# Patient Record
Sex: Male | Born: 1935 | Race: White | Hispanic: No | Marital: Married | Smoking: Never smoker
Health system: Southern US, Community
[De-identification: ages and names within clinical notes are randomized; demographics above are authoritative.]

---

## 2020-09-29 ENCOUNTER — Emergency Department: Payer: Medicare HMO

## 2020-09-29 ENCOUNTER — Observation Stay: Payer: Medicare HMO

## 2020-09-29 ENCOUNTER — Encounter: Payer: Self-pay | Admitting: Internal Medicine

## 2020-09-29 ENCOUNTER — Inpatient Hospital Stay
Admission: EM | Admit: 2020-09-29 | Discharge: 2020-10-07 | DRG: 175 | Disposition: A | Discharge status: Hospice | Payer: Medicare HMO | Attending: Family Medicine | Admitting: Family Medicine

## 2020-09-29 ENCOUNTER — Other Ambulatory Visit: Payer: Self-pay

## 2020-09-29 DIAGNOSIS — M109 Gout, unspecified: Secondary | ICD-10-CM | POA: Diagnosis present

## 2020-09-29 DIAGNOSIS — I495 Sick sinus syndrome: Secondary | ICD-10-CM | POA: Diagnosis present

## 2020-09-29 DIAGNOSIS — E039 Hypothyroidism, unspecified: Secondary | ICD-10-CM | POA: Diagnosis present

## 2020-09-29 DIAGNOSIS — N179 Acute kidney failure, unspecified: Secondary | ICD-10-CM | POA: Diagnosis present

## 2020-09-29 DIAGNOSIS — R0603 Acute respiratory distress: Secondary | ICD-10-CM | POA: Diagnosis not present

## 2020-09-29 DIAGNOSIS — Z20822 Contact with and (suspected) exposure to covid-19: Secondary | ICD-10-CM | POA: Diagnosis present

## 2020-09-29 DIAGNOSIS — K449 Diaphragmatic hernia without obstruction or gangrene: Secondary | ICD-10-CM | POA: Diagnosis present

## 2020-09-29 DIAGNOSIS — Z9889 Other specified postprocedural states: Secondary | ICD-10-CM

## 2020-09-29 DIAGNOSIS — R0602 Shortness of breath: Secondary | ICD-10-CM

## 2020-09-29 DIAGNOSIS — Z79899 Other long term (current) drug therapy: Secondary | ICD-10-CM

## 2020-09-29 DIAGNOSIS — I482 Chronic atrial fibrillation, unspecified: Secondary | ICD-10-CM | POA: Diagnosis present

## 2020-09-29 DIAGNOSIS — J9811 Atelectasis: Secondary | ICD-10-CM | POA: Diagnosis not present

## 2020-09-29 DIAGNOSIS — I130 Hypertensive heart and chronic kidney disease with heart failure and stage 1 through stage 4 chronic kidney disease, or unspecified chronic kidney disease: Secondary | ICD-10-CM | POA: Diagnosis present

## 2020-09-29 DIAGNOSIS — G9341 Metabolic encephalopathy: Secondary | ICD-10-CM | POA: Diagnosis present

## 2020-09-29 DIAGNOSIS — E785 Hyperlipidemia, unspecified: Secondary | ICD-10-CM | POA: Diagnosis present

## 2020-09-29 DIAGNOSIS — E222 Syndrome of inappropriate secretion of antidiuretic hormone: Secondary | ICD-10-CM | POA: Diagnosis present

## 2020-09-29 DIAGNOSIS — Z7901 Long term (current) use of anticoagulants: Secondary | ICD-10-CM

## 2020-09-29 DIAGNOSIS — D631 Anemia in chronic kidney disease: Secondary | ICD-10-CM | POA: Diagnosis present

## 2020-09-29 DIAGNOSIS — I2609 Other pulmonary embolism with acute cor pulmonale: Secondary | ICD-10-CM | POA: Diagnosis not present

## 2020-09-29 DIAGNOSIS — I509 Heart failure, unspecified: Secondary | ICD-10-CM

## 2020-09-29 DIAGNOSIS — Z950 Presence of cardiac pacemaker: Secondary | ICD-10-CM

## 2020-09-29 DIAGNOSIS — J90 Pleural effusion, not elsewhere classified: Secondary | ICD-10-CM | POA: Diagnosis present

## 2020-09-29 DIAGNOSIS — R34 Anuria and oliguria: Secondary | ICD-10-CM | POA: Diagnosis not present

## 2020-09-29 DIAGNOSIS — I50810 Right heart failure, unspecified: Secondary | ICD-10-CM | POA: Diagnosis present

## 2020-09-29 DIAGNOSIS — E43 Unspecified severe protein-calorie malnutrition: Secondary | ICD-10-CM | POA: Diagnosis present

## 2020-09-29 DIAGNOSIS — D509 Iron deficiency anemia, unspecified: Secondary | ICD-10-CM | POA: Diagnosis present

## 2020-09-29 DIAGNOSIS — I2782 Chronic pulmonary embolism: Secondary | ICD-10-CM | POA: Diagnosis present

## 2020-09-29 DIAGNOSIS — I959 Hypotension, unspecified: Secondary | ICD-10-CM | POA: Diagnosis present

## 2020-09-29 DIAGNOSIS — K5730 Diverticulosis of large intestine without perforation or abscess without bleeding: Secondary | ICD-10-CM | POA: Diagnosis present

## 2020-09-29 DIAGNOSIS — E875 Hyperkalemia: Secondary | ICD-10-CM | POA: Diagnosis present

## 2020-09-29 DIAGNOSIS — H918X9 Other specified hearing loss, unspecified ear: Secondary | ICD-10-CM | POA: Diagnosis present

## 2020-09-29 DIAGNOSIS — K7460 Unspecified cirrhosis of liver: Secondary | ICD-10-CM

## 2020-09-29 DIAGNOSIS — Z515 Encounter for palliative care: Secondary | ICD-10-CM

## 2020-09-29 DIAGNOSIS — N1832 Chronic kidney disease, stage 3b: Secondary | ICD-10-CM | POA: Diagnosis present

## 2020-09-29 DIAGNOSIS — I2724 Chronic thromboembolic pulmonary hypertension: Secondary | ICD-10-CM | POA: Diagnosis present

## 2020-09-29 DIAGNOSIS — K257 Chronic gastric ulcer without hemorrhage or perforation: Secondary | ICD-10-CM | POA: Diagnosis present

## 2020-09-29 DIAGNOSIS — Z72820 Sleep deprivation: Secondary | ICD-10-CM

## 2020-09-29 DIAGNOSIS — I2510 Atherosclerotic heart disease of native coronary artery without angina pectoris: Secondary | ICD-10-CM | POA: Diagnosis present

## 2020-09-29 DIAGNOSIS — Z903 Acquired absence of stomach [part of]: Secondary | ICD-10-CM

## 2020-09-29 DIAGNOSIS — Z681 Body mass index (BMI) 19 or less, adult: Secondary | ICD-10-CM

## 2020-09-29 DIAGNOSIS — E871 Hypo-osmolality and hyponatremia: Secondary | ICD-10-CM

## 2020-09-29 DIAGNOSIS — R64 Cachexia: Secondary | ICD-10-CM | POA: Diagnosis present

## 2020-09-29 DIAGNOSIS — I4819 Other persistent atrial fibrillation: Secondary | ICD-10-CM | POA: Diagnosis present

## 2020-09-29 DIAGNOSIS — J9601 Acute respiratory failure with hypoxia: Secondary | ICD-10-CM | POA: Diagnosis present

## 2020-09-29 DIAGNOSIS — R54 Age-related physical debility: Secondary | ICD-10-CM | POA: Diagnosis present

## 2020-09-29 DIAGNOSIS — I272 Pulmonary hypertension, unspecified: Secondary | ICD-10-CM | POA: Diagnosis present

## 2020-09-29 LAB — COMPREHENSIVE METABOLIC PANEL
ALT: 14 U/L (ref 0–44)
AST: 33 U/L (ref 15–41)
Albumin: 3.5 g/dL (ref 3.5–5.0)
Alkaline Phosphatase: 137 U/L — ABNORMAL HIGH (ref 38–126)
Anion gap: 13 (ref 5–15)
BUN: 35 mg/dL — ABNORMAL HIGH (ref 8–23)
CO2: 24 mmol/L (ref 22–32)
Calcium: 9.3 mg/dL (ref 8.9–10.3)
Chloride: 85 mmol/L — ABNORMAL LOW (ref 98–111)
Creatinine, Ser: 1.24 mg/dL (ref 0.61–1.24)
GFR, Estimated: 57 mL/min — ABNORMAL LOW (ref >60)
Glucose, Bld: 75 mg/dL (ref 70–99)
Potassium: 4.2 mmol/L (ref 3.5–5.1)
Sodium: 122 mmol/L — ABNORMAL LOW (ref 135–145)
Total Bilirubin: 1.5 mg/dL — ABNORMAL HIGH (ref 0.3–1.2)
Total Protein: 7 g/dL (ref 6.5–8.1)

## 2020-09-29 LAB — CBC WITH DIFFERENTIAL/PLATELET
Abs Immature Granulocytes: 0.01 10*3/uL (ref 0.00–0.07)
Basophils Absolute: 0 10*3/uL (ref 0.0–0.1)
Basophils Relative: 1 %
Eosinophils Absolute: 0 10*3/uL (ref 0.0–0.5)
Eosinophils Relative: 1 %
HCT: 33.1 % — ABNORMAL LOW (ref 39.0–52.0)
Hemoglobin: 11.4 g/dL — ABNORMAL LOW (ref 13.0–17.0)
Immature Granulocytes: 0 %
Lymphocytes Relative: 14 %
Lymphs Abs: 0.6 10*3/uL — ABNORMAL LOW (ref 0.7–4.0)
MCH: 32.3 pg (ref 26.0–34.0)
MCHC: 34.4 g/dL (ref 30.0–36.0)
MCV: 93.8 fL (ref 80.0–100.0)
Monocytes Absolute: 0.5 10*3/uL (ref 0.1–1.0)
Monocytes Relative: 13 %
Neutro Abs: 2.8 10*3/uL (ref 1.7–7.7)
Neutrophils Relative %: 71 %
Platelets: 157 10*3/uL (ref 150–400)
RBC: 3.53 MIL/uL — ABNORMAL LOW (ref 4.22–5.81)
RDW: 14.7 % (ref 11.5–15.5)
WBC: 4 10*3/uL (ref 4.0–10.5)
nRBC: 0 % (ref 0.0–0.2)

## 2020-09-29 LAB — RESP PANEL BY RT-PCR (FLU A&B, COVID) ARPGX2
Influenza A by PCR: NEGATIVE
Influenza B by PCR: NEGATIVE
SARS Coronavirus 2 by RT PCR: NEGATIVE

## 2020-09-29 LAB — HEPARIN LEVEL (UNFRACTIONATED): Heparin Unfractionated: 1.59 IU/mL — ABNORMAL HIGH (ref 0.30–0.70)

## 2020-09-29 LAB — PROCALCITONIN: Procalcitonin: 0.16 ng/mL

## 2020-09-29 LAB — TSH: TSH: 5.354 u[IU]/mL — ABNORMAL HIGH (ref 0.350–4.500)

## 2020-09-29 LAB — PROTIME-INR
INR: 1.6 — ABNORMAL HIGH (ref 0.8–1.2)
Prothrombin Time: 18.6 seconds — ABNORMAL HIGH (ref 11.4–15.2)

## 2020-09-29 LAB — TROPONIN I (HIGH SENSITIVITY)
Troponin I (High Sensitivity): 33 ng/L — ABNORMAL HIGH (ref <18)
Troponin I (High Sensitivity): 37 ng/L — ABNORMAL HIGH (ref <18)

## 2020-09-29 LAB — APTT: aPTT: 45 seconds — ABNORMAL HIGH (ref 24–36)

## 2020-09-29 LAB — MRSA PCR SCREENING: MRSA by PCR: NEGATIVE

## 2020-09-29 LAB — BRAIN NATRIURETIC PEPTIDE: B Natriuretic Peptide: 1945.5 pg/mL — ABNORMAL HIGH (ref 0.0–100.0)

## 2020-09-29 MED ORDER — HEPARIN BOLUS VIA INFUSION
3000.0000 [IU] | Freq: Once | INTRAVENOUS | Status: AC
  Administered 2020-09-29: 3000 [IU] via INTRAVENOUS
  Filled 2020-09-29: qty 3000

## 2020-09-29 MED ORDER — ONDANSETRON HCL 4 MG PO TABS: 4.0000 mg | ORAL_TABLET | Freq: Four times a day (QID) | ORAL | Status: DC | PRN

## 2020-09-29 MED ORDER — SILDENAFIL CITRATE 20 MG PO TABS
20.0000 mg | ORAL_TABLET | Freq: Three times a day (TID) | ORAL | Status: DC
  Administered 2020-09-29 – 2020-10-03 (×6): 20 mg via ORAL
  Filled 2020-09-29 (×13): qty 1

## 2020-09-29 MED ORDER — ACETAMINOPHEN 325 MG PO TABS: 325.0000 mg | ORAL_TABLET | Freq: Four times a day (QID) | ORAL | Status: AC | PRN

## 2020-09-29 MED ORDER — ACETAMINOPHEN 650 MG RE SUPP: 325.0000 mg | Freq: Four times a day (QID) | RECTAL | Status: AC | PRN

## 2020-09-29 MED ORDER — ALLOPURINOL 100 MG PO TABS
100.0000 mg | ORAL_TABLET | Freq: Every day | ORAL | Status: DC
  Administered 2020-09-29 – 2020-10-06 (×8): 100 mg via ORAL
  Filled 2020-09-29 (×8): qty 1

## 2020-09-29 MED ORDER — ATORVASTATIN CALCIUM 20 MG PO TABS
20.0000 mg | ORAL_TABLET | Freq: Every day | ORAL | Status: DC
  Administered 2020-09-29 – 2020-10-06 (×8): 20 mg via ORAL
  Filled 2020-09-29 (×7): qty 1

## 2020-09-29 MED ORDER — SODIUM CHLORIDE 0.9 % IV BOLUS: 500.0000 mL | Freq: Once | INTRAVENOUS | Status: DC

## 2020-09-29 MED ORDER — ONDANSETRON HCL 4 MG/2ML IJ SOLN
4.0000 mg | Freq: Four times a day (QID) | INTRAMUSCULAR | Status: DC | PRN
  Administered 2020-10-03: 17:00:00 4 mg via INTRAVENOUS
  Filled 2020-09-29: qty 2

## 2020-09-29 MED ORDER — ATENOLOL 50 MG PO TABS
50.0000 mg | ORAL_TABLET | Freq: Every day | ORAL | Status: DC
  Administered 2020-09-29 – 2020-10-01 (×2): 50 mg via ORAL
  Filled 2020-09-29 (×4): qty 1

## 2020-09-29 MED ORDER — SODIUM CHLORIDE 0.9 % IV SOLN: INTRAVENOUS | Status: DC

## 2020-09-29 MED ORDER — HEPARIN (PORCINE) 25000 UT/250ML-% IV SOLN
800.0000 [IU]/h | INTRAVENOUS | Status: DC
  Administered 2020-09-29: 800 [IU]/h via INTRAVENOUS
  Filled 2020-09-29: qty 250

## 2020-09-29 MED ORDER — MIRTAZAPINE 15 MG PO TABS
7.5000 mg | ORAL_TABLET | Freq: Every day | ORAL | Status: DC
  Administered 2020-09-29 – 2020-10-05 (×6): 7.5 mg via ORAL
  Filled 2020-09-29 (×8): qty 1

## 2020-09-29 MED ORDER — IOHEXOL 350 MG/ML SOLN
75.0000 mL | Freq: Once | INTRAVENOUS | Status: AC | PRN
  Administered 2020-09-29: 75 mL via INTRAVENOUS
  Filled 2020-09-29: qty 75

## 2020-09-29 MED ORDER — METOPROLOL TARTRATE 5 MG/5ML IV SOLN: 5.0000 mg | INTRAVENOUS | Status: DC | PRN

## 2020-09-29 NOTE — ED Notes (Signed)
Patient repositioned in bed and given ginger ale.

## 2020-09-29 NOTE — ED Triage Notes (Addendum)
Pt comes via EMS from [facility] independent living with c/o increased weakness and failure to thrive.  O2 90% RA on and placed on 2L increased to 97%, 97.9 temp CBG-109, IV in place  Pt states SOB but now better since being placed on the O2

## 2020-09-29 NOTE — Progress Notes (Deleted)
Pts daughter came to visit tonight from out of town.

## 2020-09-29 NOTE — Progress Notes (Signed)
Room now clean and ready to receive patient. ED RN updated and messaged that she will be up with patient soon.

## 2020-09-29 NOTE — ED Provider Notes (Signed)
[facility] Emergency Department Provider Note  ____________________________________________   Event Date/Time   First MD Initiated Contact with Patient 09/29/20 1046     (approximate)  I have reviewed the triage vital signs and the nursing notes.   HISTORY  Chief Complaint Shortness of breath, fatigue  HPI Patrick Mccormick is a 87 y.o. male with pulmonary hypertension and recurrent pleural effusions requiring tapping at [facility] who comes in with shortness of breath and not eating.  Patient reports worsening shortness of breath, constant, nothing makes it better, but worse with ambulation.  He reports that he has not had a tap in over a year.  He has been compliant with his Lasix and medication for pulmonary hypertension.  Patient states that he is not eating or drinking for some time now.  He states that just because he does not feel hungry.  He denies having any abdominal pain.  Denies any fevers, cough.  He lives in independent living.  Patient was found to be satting 90% on room air  On review of records patient's last chest x-ray was on 9/23 which showed similar right loculated pleural effusion       Medical: Pulmonary hypertension, pleural effusion  There are no problems to display for this patient.     Allergies Patient has no allergy information on record.  No family history on file.  Social History   No daily drinking or drug   Review of Systems Constitutional: No fever/chills, fatigue Eyes: No visual changes. ENT: No sore throat. Cardiovascular: No chest pain Respiratory: Positive for SOB Gastrointestinal: No abdominal pain.  No nausea, no vomiting.  No diarrhea.  No constipation.  Not eating or drinking Genitourinary: Negative for dysuria. Musculoskeletal: Negative for back pain. Skin: Negative for rash. Neurological: Negative for headaches, focal weakness or numbness. All other ROS  negative ____________________________________________   PHYSICAL EXAM:  VITAL SIGNS: ED Triage Vitals  Enc Vitals Group     BP 09/29/20 1041 128/81     Pulse Rate 09/29/20 1041 65     Resp 09/29/20 1041 20     Temp 09/29/20 1108 97.6 F (36.4 C)     Temp Source 09/29/20 1108 Axillary     SpO2 09/29/20 1057 100 %     Weight --      Height --      Head Circumference --      Peak Flow --      Pain Score 09/29/20 1039 0     Pain Loc --      Pain Edu? --      Excl. in GC? --     Constitutional: Alert and oriented. Well appearing and in no acute distress. Eyes: Conjunctivae are normal. EOMI. Head: Atraumatic. Nose: No congestion/rhinnorhea. Mouth/Throat: Mucous membranes are moist.   Neck: No stridor. Trachea Midline. FROM Cardiovascular: Normal rate, regular rhythm. Grossly normal heart sounds.  Good peripheral circulation. Respiratory: No increased work of breathing, decreased lung sounds on the right, on 2 L Gastrointestinal: Soft and nontender. No distention. No abdominal bruits.  Musculoskeletal: No lower extremity tenderness nor edema.  No joint effusions. Neurologic:  Normal speech and language. No gross focal neurologic deficits are appreciated.  Skin:  Skin is warm, dry and intact. No rash noted. Psychiatric: Mood and affect are normal. Speech and behavior are normal. GU: Deferred   ____________________________________________   LABS (all labs ordered are listed, but only abnormal results are displayed)  Labs Reviewed  CBC WITH DIFFERENTIAL/PLATELET -  Abnormal; Notable for the following components:      Result Value   RBC 3.53 (*)    Hemoglobin 11.4 (*)    HCT 33.1 (*)    Lymphs Abs 0.6 (*)    All other components within normal limits  COMPREHENSIVE METABOLIC PANEL - Abnormal; Notable for the following components:   Sodium 122 (*)    Chloride 85 (*)    BUN 35 (*)    Alkaline Phosphatase 137 (*)    Total Bilirubin 1.5 (*)    GFR, Estimated 57 (*)    All  other components within normal limits  BRAIN NATRIURETIC PEPTIDE - Abnormal; Notable for the following components:   B Natriuretic Peptide 1,945.5 (*)    All other components within normal limits  TROPONIN I (HIGH SENSITIVITY) - Abnormal; Notable for the following components:   Troponin I (High Sensitivity) 33 (*)    All other components within normal limits  RESP PANEL BY RT-PCR (FLU A&B, COVID) [id number]   ____________________________________________   ED ECG REPORT I, Concha Se, the attending physician, personally viewed and interpreted this ECG.  Junctional rate of 67, no ST elevation, no T wave inversions, right bundle branch block ____________________________________________  RADIOLOGY I, Concha Se, personally viewed and evaluated these images (plain radiographs) as part of my medical decision making, as well as reviewing the written report by the radiologist.  ED MD interpretation: Pleural effusion noted on the right with possible perihilar opacity that could be mass  Official radiology report(s): DG Chest Portable 1 View  Result Date: 09/29/2020 CLINICAL DATA:  Dyspnea EXAM: PORTABLE CHEST 1 VIEW COMPARISON:  None. FINDINGS: Bilateral basilar laterally loculated pleural effusions are present, moderate on the right and small on the left. Left basilar pleural calcifications are identified. There is enlargement of the right hilum suggesting presence of right hilar mass or right hilar adenopathy. There is partial right lower lobe collapse. Opacity within the left perihilar region may represent fluid within the left major fissure. A left perihilar mass, however, is difficult to exclude. No pneumothorax. Mild cardiomegaly. Left subclavian 3 lead pacemaker in place. Vascular calcifications are seen within the abdominal aorta. The pulmonary vascularity is normal. No acute bone abnormality. IMPRESSION: Suspected right hilar mass or adenopathy with subtotal collapse of the right lower lobe  possibly representing postobstructive collapse. Bilateral loculated pleural effusions, moderate on the right and small on the left. Coarse left basilar pleural calcification may relate to remote trauma or infection. Left perihilar opacity possibly representing fluid within the fissure or a left perihilar focal pulmonary mass. This would be better assessed with dedicated contrast enhanced CT imaging. Mild cardiomegaly. Electronically Signed   By: Helyn Numbers MD   On: 09/29/2020 11:08    ____________________________________________   PROCEDURES  Procedure(s) performed (including Critical Care):  .1-3 Lead EKG Interpretation Performed by: Concha Se, MD Authorized by: Concha Se, MD     Interpretation: normal     ECG rate:  60s    ECG rate assessment: normal     Rhythm: sinus rhythm     Ectopy: none     Conduction: normal    .Critical Care Performed by: Concha Se, MD Authorized by: Concha Se, MD   Critical care provider statement:    Critical care time (minutes):  35   Critical care was necessary to treat or prevent imminent or life-threatening deterioration of the following conditions:  Respiratory failure   Critical care was time spent personally  by me on the following activities:  Discussions with consultants, evaluation of patient's response to treatment, examination of patient, ordering and performing treatments and interventions, ordering and review of laboratory studies, ordering and review of radiographic studies, pulse oximetry, re-evaluation of patient's condition, obtaining history from patient or surrogate and review of old charts     ____________________________________________   INITIAL IMPRESSION / ASSESSMENT AND PLAN / ED COURSE   Dejean Tribby was evaluated in Emergency Department on 09/29/2020 for the symptoms described in the history of present illness. He was evaluated in the context of the global COVID-19 pandemic, which necessitated  consideration that the patient might be at risk for infection with the SARS-CoV-2 virus that causes COVID-19. Institutional protocols and algorithms that pertain to the evaluation of patients at risk for COVID-19 are in a state of rapid change based on information released by regulatory bodies including the CDC and federal and state organizations. These policies and algorithms were followed during the patient's care in the ED.     Pt presents with SOB. Differential includes:  Effusion PNA-will get xray to evaluation Anemia-CBC to evaluate ACS- will get trops Arrhythmia-Will get EKG and keep on monitor.  COVID- will get testing per algorithm. PE-lower suspicion given no risk factors and other cause more likely  For patient's not eating or drinking will get labs to evaluate for Electra abnormalities, AKI.  No abdominal tenderness to suggest abdominal process  Labs are notable for hyponatremia. Reviewed his prior labs from [facility] and this is the lowest his sodium is ever been.  Patient's BNP is elevated and he does have effusions on his chest x-ray so is difficult to tell if this is secondary to possible dehydration which seems more like the clinical picture versus CHF versus potentially from this possible mass.  Given the chest x-ray will get CT PE to evaluate this mass better.  Will discuss with the hospital team for admission.  Patient is currently on 2 L given oxygen levels in the 90s at rest.    ____________________________________________   FINAL CLINICAL IMPRESSION(S) / ED DIAGNOSES   Final diagnoses:  Acute respiratory failure with hypoxia (HCC)  Hyponatremia     MEDICATIONS GIVEN DURING THIS VISIT:  Medications - No data to display   ED Discharge Orders    None       Note:  This document was prepared using Dragon voice recognition software and may include unintentional dictation errors.   Concha Se, MD 09/29/20 1210

## 2020-09-29 NOTE — Consult Note (Signed)
ANTICOAGULATION CONSULT NOTE - Initial Consult  Pharmacy Consult for heparin infusion Indication: pulmonary embolus   Vital Signs: Temp: 97.6 F (36.4 C) (02/10 1108) Temp Source: Axillary (02/10 1108) BP: 119/71 (02/10 1330) Pulse Rate: 61 (02/10 1330)  Labs: Recent Labs    09/29/20 1102 09/29/20 1310  HGB 11.4*  --   HCT 33.1*  --   PLT 157  --   CREATININE 1.24  --   TROPONINIHS 33* 37*    Estimated Creatinine Clearance: 28.6 mL/min (by C-G formula based on SCr of 1.24 mg/dL).   Medical History: No past medical history on file.  Medications:  Apixaban 2.5 mg BID on home medication list -- will assume last dose this AM  Assessment: 87 y/o male with PMH of Pulmonary HTN, afib on apixaban presents with SOB x 1 week. Chest CT c/f subacute to chronic pulmonary embolism. Patient on reduced apixaban 2.5 mg BID PTA for afib. Pharmacy has been consulted for heparin initiation and management for PE.  Baseline aPTT, INR, CBC, and HL ordered and reviewed  Goal of Therapy:  Heparin level 0.3-0.7 units/ml Heparin level 66-102 units/ml Monitor platelets by anticoagulation protocol: Yes   Plan:  Although patient on apixaban PTA will give 3000 units bolus x 1 given PE while on apixaban Start heparin infusion at 800 units/hr Given baseline HL elevated, will monitor by aPTT levels until heparin level and aPTT correlate  Check aPTT  level in 8 hours and daily while on heparin Continue to monitor H&H and platelets  Sharen Hones, PharmD, BCPS Clinical Pharmacist  09/29/2020,3:38 PM

## 2020-09-29 NOTE — H&P (Addendum)
History and Physical   Mariusz Jubb OXB:[id number] DOB: [birth year] DOA: 09/29/2020  PCP: Conan Bowens., MD  Outpatient Specialists: Dr. Ramiro Harvest, [facility] Patient coming from: home  I have personally briefly reviewed patient's old medical records in [facility] EMR.  Chief Concern: Shortness of breath  HPI: Patrick Mccormick is a 87 y.o. male with medical history significant for pulmonary hypertension, hypertension, history of SIADH, unintended weight loss, history of gastrectomy, persistent atrial fibrillation, iron deficiency anemia, presented to the emergency department for chief concerns of shortness of breath.   At baseline, patient is not on home oxygen.  He endorses shortness of breath x one week. He has unintentionally lost about 40 lbs in 1.5 years (150ish to 110) and extreme exhaustion and does not get hungry. He reports poor appetite.   He further endorses new watery diarrhea, three times per day for one week. He denies blood.   He endorses history of persistent right pleural effusion status post thoracentesis in which he had about two liters removed from only his right lungs about two years ago.   Social history: He is married and lives with his spouse of 50 years. 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 He has never been a tobacco user. He drinks about two glasses of wine per night.   ROS: Constitutional: + weight change, no fever ENT/Mouth: no sore throat, no rhinorrhea Eyes: no eye pain, no vision changes Cardiovascular: no chest pain, + dyspnea,  no edema, no palpitations Respiratory: no cough, no sputum, no wheezing Gastrointestinal: no nausea, no vomiting, no diarrhea, no constipation Genitourinary: no urinary incontinence, no dysuria, no hematuria Musculoskeletal: no arthralgias, no myalgias Skin: no skin lesions, no pruritus, Neuro: + weakness, no loss of  consciousness, no syncope Psych: no anxiety, no depression, + decrease appetite Heme/Lymph: no bruising, no bleeding  ED Course: Discussed with ED provider, patient required hospitalization due to hypoxic respiratory failure and worsening bilateral pleural effusion.  Assessment/Plan  Principal Problem:   Respiratory distress Active Problems:   Pleural effusion due to congestive heart failure (HCC)   Pulmonary hypertension (HCC)   Atrial fibrillation, chronic (HCC)   History of partial gastrectomy   Acute respiratory failure-multifactorial including complex bilateral loculated pleural effusion  versus possible pulmonary embolism vs heart failure exacacerbation -CT a of the chest to assess for PE was ordered by ED provider and results were called into me at the time of interpretation showing left main pulmonary artery and extending into the lingular branches may reflect chronic pulmonary embolism with acute pulmonary embolism less favored however there was antidependent incomplete opacification of the left lower lobe pulmonary arteries could reflect laminar flow artifact however underlying pulmonary embolism cannot be excluded there is evidence of right heart strain. -Patient desatted to the 80s and was placed on 2 L nasal cannula -At baseline patient does not require home oxygen -Heparin GTT initiated -Complete echo ordered  Complex bilateral loculated pleural effusion-present on admission -Thoracentesis ultrasound-guided ordered with labs to assess for carcinoma and staining and cultures  Pulmonary hypertension-sildenafil 20 mg 3 times daily  Persistent atrial fibrillation-on Eliquis 2.5 mg every 12 hours, atenolol 50 mg daily  Weakness - will check B12, TSH -Suspect secondary to debility and possibility of underlying carcinoma or history of gastrectomy in May 2020 given unintentional 40 pound weight loss  Severe protein and calorie malnutrition 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 difficile at this time given no leukocytosis and renal involvement however will check C. Difficile  Chart reviewed.   Persistent atrial fibrillation status post permanent pacemaker placement in December 2019, history of pleural effusion status post thoracentesis x2, history of acute Sheria Lang ulcer status post gastrectomy in May 2020.  DVT prophylaxis: Heparin GTT Code Status: Intubation only and spouse to decide on removal of care.  Diet: Regular diet Family Communication: Updated spouse at bedside Disposition Plan: Pending clinical course Consults called: IR for thoracentesis Admission status: Observation to progressive cardiac  No past medical history on file.  Social History:  has no history on file for tobacco use, alcohol use, and drug use.  Not on File No family history on file. Family history: Family history reviewed and not pertinent  Prior to Admission medications   Not on File   Physical Exam: Vitals:   09/29/20 1041 09/29/20 1057 09/29/20 1108 09/29/20 1130  BP: 128/81   125/79  Pulse: 65 80  (!) 59  Resp: 20 15  (!) 25  Temp:   97.6 F (36.4 C)   TempSrc:   Axillary   SpO2:  100%  99%   Constitutional: appears age-appropriate, NAD, calm, comfortable Eyes: PERRL, lids and conjunctivae normal ENMT: Mucous membranes are moist. Posterior pharynx clear of any exudate or lesions. Age-appropriate dentition. Mild hearing loss Neck: normal, supple, no masses, no thyromegaly Respiratory: Bilateral decreased lung sounds and negative for wheezing. Crackles in left lower lobe on auscultation.. Normal respiratory effort. No accessory muscle use.  Cardiovascular: Regular rate and rhythm, no murmurs / rubs / gallops. No extremity edema. 2+ pedal pulses. No carotid bruits.  Abdomen: no tenderness, no masses palpated, no hepatosplenomegaly. Bowel sounds positive.   Musculoskeletal: no clubbing / cyanosis. No joint deformity upper and lower extremities. Good ROM, no contractures, no atrophy. Normal muscle tone.  Skin: no rashes, lesions, ulcers. No induration Neurologic: Sensation intact. Strength 5/5 in all 4.  Psychiatric: Normal judgment and insight. Alert and oriented x 3. Normal mood.   EKG: independently reviewed, showing junctional rhythm with rate of 67, right bundle branch block, QTC 457  Chest x-ray on Admission: I personally reviewed and I agree with radiologist reading as below.  DG Chest Portable 1 View  Result Date: 09/29/2020 CLINICAL DATA:  Dyspnea EXAM: PORTABLE CHEST 1 VIEW COMPARISON:  None. FINDINGS: Bilateral basilar laterally loculated pleural effusions are present, moderate on the right and small on the left. Left basilar pleural calcifications are identified. There is enlargement of the right hilum suggesting presence of right hilar mass or right hilar adenopathy. There is partial right lower lobe collapse. Opacity within the left perihilar region may represent fluid within the left major fissure. A left perihilar mass, however, is difficult to exclude. No pneumothorax. Mild cardiomegaly. Left subclavian 3 lead pacemaker in place. Vascular calcifications are seen within the abdominal aorta. The pulmonary vascularity is normal. No acute bone abnormality. IMPRESSION: Suspected right hilar mass or adenopathy with subtotal collapse of the right lower lobe possibly representing postobstructive collapse. Bilateral loculated pleural effusions, moderate on the right and small on the left. Coarse left basilar pleural calcification may relate to remote trauma or infection. Left perihilar opacity possibly representing fluid within the fissure or a left perihilar focal pulmonary mass. This would be better assessed with dedicated contrast enhanced CT imaging. Mild cardiomegaly. Electronically Signed   By: Helyn Numbers MD  On: 09/29/2020 11:08   Labs  on Admission: I have personally reviewed following labs  CBC: Recent Labs  Lab 09/29/20 1102  WBC 4.0  NEUTROABS 2.8  HGB 11.4*  HCT 33.1*  MCV 93.8  PLT 157   Basic Metabolic Panel: Recent Labs  Lab 09/29/20 1102  NA 122*  K 4.2  CL 85*  CO2 24  GLUCOSE 75  BUN 35*  CREATININE 1.24  CALCIUM 9.3   GFR: CrCl cannot be calculated (Unknown ideal weight.). Liver Function Tests: Recent Labs  Lab 09/29/20 1102  AST 33  ALT 14  ALKPHOS 137*  BILITOT 1.5*  PROT 7.0  ALBUMIN 3.5   CRITICAL CARE Performed by: Nadyne Coombes Takumi Din  Total critical care time: 30 minutes  Critical care time was exclusive of separately billable procedures and treating other patients.  Critical care was necessary to treat or prevent imminent or life-threatening deterioration. Respiratory failure   Critical care was time spent personally by me on the following activities: development of treatment plan with patient and/or surrogate as well as nursing, discussions with consultants, evaluation of patient's response to treatment, examination of patient, obtaining history from patient or surrogate, ordering and performing treatments and interventions, ordering and review of laboratory studies, ordering and review of radiographic studies, pulse oximetry and re-evaluation of patient's condition.  Jarriel Papillion N Devany Aja D.O. [facility]  If 7PM-7AM, please contact overnight-coverage provider If 7AM-7PM, please contact day coverage provider [email or web address]  09/29/2020, 1:05 PM

## 2020-09-29 NOTE — Progress Notes (Addendum)
Secure chat RN Brynda Greathouse ED reference patient's status.  Notified RN Black that room is Dirty at this time and not ready to receive patient. I will message RN Black when room is clean and ready to receive patient. RN Black acknowledged message and aware I will contact when room ready.

## 2020-09-29 NOTE — ED Notes (Signed)
Patient repositioned in bed and given warm blankets. 

## 2020-09-29 NOTE — ED Notes (Signed)
Cox, DO at bedside.

## 2020-09-30 ENCOUNTER — Encounter: Payer: Self-pay | Admitting: Internal Medicine

## 2020-09-30 ENCOUNTER — Observation Stay: Payer: Medicare HMO

## 2020-09-30 ENCOUNTER — Observation Stay
Admit: 2020-09-30 | Discharge: 2020-09-30 | Disposition: A | Discharge status: Home / Self Care | Payer: Medicare HMO | Attending: Internal Medicine | Admitting: Internal Medicine

## 2020-09-30 DIAGNOSIS — K257 Chronic gastric ulcer without hemorrhage or perforation: Secondary | ICD-10-CM | POA: Diagnosis present

## 2020-09-30 DIAGNOSIS — Z20822 Contact with and (suspected) exposure to covid-19: Secondary | ICD-10-CM | POA: Diagnosis present

## 2020-09-30 DIAGNOSIS — G9341 Metabolic encephalopathy: Secondary | ICD-10-CM | POA: Diagnosis present

## 2020-09-30 DIAGNOSIS — N179 Acute kidney failure, unspecified: Secondary | ICD-10-CM | POA: Diagnosis present

## 2020-09-30 DIAGNOSIS — K5730 Diverticulosis of large intestine without perforation or abscess without bleeding: Secondary | ICD-10-CM | POA: Diagnosis present

## 2020-09-30 DIAGNOSIS — J90 Pleural effusion, not elsewhere classified: Secondary | ICD-10-CM | POA: Diagnosis present

## 2020-09-30 DIAGNOSIS — I2724 Chronic thromboembolic pulmonary hypertension: Secondary | ICD-10-CM | POA: Diagnosis present

## 2020-09-30 DIAGNOSIS — E43 Unspecified severe protein-calorie malnutrition: Secondary | ICD-10-CM | POA: Insufficient documentation

## 2020-09-30 DIAGNOSIS — I2510 Atherosclerotic heart disease of native coronary artery without angina pectoris: Secondary | ICD-10-CM | POA: Diagnosis present

## 2020-09-30 DIAGNOSIS — E875 Hyperkalemia: Secondary | ICD-10-CM | POA: Diagnosis present

## 2020-09-30 DIAGNOSIS — J9601 Acute respiratory failure with hypoxia: Secondary | ICD-10-CM | POA: Diagnosis present

## 2020-09-30 DIAGNOSIS — E222 Syndrome of inappropriate secretion of antidiuretic hormone: Secondary | ICD-10-CM | POA: Diagnosis present

## 2020-09-30 DIAGNOSIS — I509 Heart failure, unspecified: Secondary | ICD-10-CM | POA: Diagnosis not present

## 2020-09-30 DIAGNOSIS — Z515 Encounter for palliative care: Secondary | ICD-10-CM | POA: Diagnosis not present

## 2020-09-30 DIAGNOSIS — I2609 Other pulmonary embolism with acute cor pulmonale: Secondary | ICD-10-CM | POA: Diagnosis present

## 2020-09-30 DIAGNOSIS — I2782 Chronic pulmonary embolism: Secondary | ICD-10-CM | POA: Diagnosis present

## 2020-09-30 DIAGNOSIS — N1832 Chronic kidney disease, stage 3b: Secondary | ICD-10-CM | POA: Diagnosis present

## 2020-09-30 DIAGNOSIS — Z7189 Other specified counseling: Secondary | ICD-10-CM | POA: Diagnosis not present

## 2020-09-30 DIAGNOSIS — I959 Hypotension, unspecified: Secondary | ICD-10-CM | POA: Diagnosis present

## 2020-09-30 DIAGNOSIS — K449 Diaphragmatic hernia without obstruction or gangrene: Secondary | ICD-10-CM | POA: Diagnosis present

## 2020-09-30 DIAGNOSIS — Z681 Body mass index (BMI) 19 or less, adult: Secondary | ICD-10-CM | POA: Diagnosis not present

## 2020-09-30 DIAGNOSIS — I4819 Other persistent atrial fibrillation: Secondary | ICD-10-CM | POA: Diagnosis present

## 2020-09-30 DIAGNOSIS — J9811 Atelectasis: Secondary | ICD-10-CM | POA: Diagnosis not present

## 2020-09-30 DIAGNOSIS — I130 Hypertensive heart and chronic kidney disease with heart failure and stage 1 through stage 4 chronic kidney disease, or unspecified chronic kidney disease: Secondary | ICD-10-CM | POA: Diagnosis present

## 2020-09-30 DIAGNOSIS — I50810 Right heart failure, unspecified: Secondary | ICD-10-CM | POA: Diagnosis present

## 2020-09-30 DIAGNOSIS — R64 Cachexia: Secondary | ICD-10-CM | POA: Diagnosis present

## 2020-09-30 DIAGNOSIS — R0603 Acute respiratory distress: Secondary | ICD-10-CM | POA: Diagnosis not present

## 2020-09-30 DIAGNOSIS — Z903 Acquired absence of stomach [part of]: Secondary | ICD-10-CM | POA: Diagnosis not present

## 2020-09-30 LAB — CBC
HCT: 28.4 % — ABNORMAL LOW (ref 39.0–52.0)
Hemoglobin: 10.2 g/dL — ABNORMAL LOW (ref 13.0–17.0)
MCH: 32.7 pg (ref 26.0–34.0)
MCHC: 35.9 g/dL (ref 30.0–36.0)
MCV: 91 fL (ref 80.0–100.0)
Platelets: 128 10*3/uL — ABNORMAL LOW (ref 150–400)
RBC: 3.12 MIL/uL — ABNORMAL LOW (ref 4.22–5.81)
RDW: 14.1 % (ref 11.5–15.5)
WBC: 3.8 10*3/uL — ABNORMAL LOW (ref 4.0–10.5)
nRBC: 0 % (ref 0.0–0.2)

## 2020-09-30 LAB — GLUCOSE, CAPILLARY
Glucose-Capillary: 37 mg/dL — CL (ref 70–99)
Glucose-Capillary: 119 mg/dL — ABNORMAL HIGH (ref 70–99)
Glucose-Capillary: 93 mg/dL (ref 70–99)

## 2020-09-30 LAB — ECHOCARDIOGRAM COMPLETE
AR max vel: 1.97 cm2
AV Area VTI: 1.71 cm2
AV Area mean vel: 1.92 cm2
AV Mean grad: 3 mmHg
AV Peak grad: 5.9 mmHg
Ao pk vel: 1.21 m/s
Area-P 1/2: 6.96 cm2
Height: 67 in
MV VTI: 2.36 cm2
S' Lateral: 2.1 cm
Weight: 1412.8 oz

## 2020-09-30 LAB — LACTATE DEHYDROGENASE, PLEURAL OR PERITONEAL FLUID: LD, Fluid: 49 U/L — ABNORMAL HIGH (ref 3–23)

## 2020-09-30 LAB — OSMOLALITY: Osmolality: 260 mOsm/kg — ABNORMAL LOW (ref 275–295)

## 2020-09-30 LAB — HEPARIN LEVEL (UNFRACTIONATED): Heparin Unfractionated: 1.16 IU/mL — ABNORMAL HIGH (ref 0.30–0.70)

## 2020-09-30 LAB — AMYLASE, PLEURAL OR PERITONEAL FLUID: Amylase, Fluid: 25 U/L

## 2020-09-30 LAB — BASIC METABOLIC PANEL
Anion gap: 12 (ref 5–15)
Anion gap: 10 (ref 5–15)
BUN: 35 mg/dL — ABNORMAL HIGH (ref 8–23)
BUN: 34 mg/dL — ABNORMAL HIGH (ref 8–23)
CO2: 21 mmol/L — ABNORMAL LOW (ref 22–32)
CO2: 22 mmol/L (ref 22–32)
Calcium: 8.9 mg/dL (ref 8.9–10.3)
Calcium: 8.7 mg/dL — ABNORMAL LOW (ref 8.9–10.3)
Chloride: 89 mmol/L — ABNORMAL LOW (ref 98–111)
Chloride: 88 mmol/L — ABNORMAL LOW (ref 98–111)
Creatinine, Ser: 1.24 mg/dL (ref 0.61–1.24)
Creatinine, Ser: 1.24 mg/dL (ref 0.61–1.24)
GFR, Estimated: 57 mL/min — ABNORMAL LOW (ref >60)
GFR, Estimated: 57 mL/min — ABNORMAL LOW (ref >60)
Glucose, Bld: 45 mg/dL — ABNORMAL LOW (ref 70–99)
Glucose, Bld: 104 mg/dL — ABNORMAL HIGH (ref 70–99)
Potassium: 4.1 mmol/L (ref 3.5–5.1)
Potassium: 4 mmol/L (ref 3.5–5.1)
Sodium: 122 mmol/L — ABNORMAL LOW (ref 135–145)
Sodium: 120 mmol/L — ABNORMAL LOW (ref 135–145)

## 2020-09-30 LAB — BODY FLUID CELL COUNT WITH DIFFERENTIAL
Eos, Fluid: 1 %
Lymphs, Fluid: 88 %
Monocyte-Macrophage-Serous Fluid: 3 %
Neutrophil Count, Fluid: 8 %
Total Nucleated Cell Count, Fluid: 113 cu mm

## 2020-09-30 LAB — PROTEIN, PLEURAL OR PERITONEAL FLUID: Total protein, fluid: <3 g/dL

## 2020-09-30 LAB — SODIUM, URINE, RANDOM: Sodium, Ur: <10 mmol/L

## 2020-09-30 LAB — T4, FREE: Free T4: 1.32 ng/dL — ABNORMAL HIGH (ref 0.61–1.12)

## 2020-09-30 LAB — CORTISOL-AM, BLOOD: Cortisol - AM: 15.8 ug/dL (ref 6.7–22.6)

## 2020-09-30 LAB — GLUCOSE, PLEURAL OR PERITONEAL FLUID: Glucose, Fluid: 52 mg/dL

## 2020-09-30 LAB — APTT
aPTT: >160 seconds (ref 24–36)
aPTT: 105 seconds — ABNORMAL HIGH (ref 24–36)

## 2020-09-30 LAB — OSMOLALITY, URINE: Osmolality, Ur: 457 mOsm/kg (ref 300–900)

## 2020-09-30 MED ORDER — ENSURE ENLIVE PO LIQD
237.0000 mL | Freq: Three times a day (TID) | ORAL | Status: DC
  Administered 2020-09-30 – 2020-10-07 (×19): 237 mL via ORAL

## 2020-09-30 MED ORDER — APIXABAN 5 MG PO TABS
5.0000 mg | ORAL_TABLET | Freq: Two times a day (BID) | ORAL | Status: DC
  Administered 2020-10-07: 5 mg via ORAL
  Filled 2020-09-30: qty 1

## 2020-09-30 MED ORDER — FUROSEMIDE 10 MG/ML IJ SOLN: 40.0000 mg | Freq: Every day | INTRAMUSCULAR | Status: DC

## 2020-09-30 MED ORDER — HEPARIN (PORCINE) 25000 UT/250ML-% IV SOLN
650.0000 [IU]/h | INTRAVENOUS | Status: DC
  Administered 2020-09-30: 650 [IU]/h via INTRAVENOUS

## 2020-09-30 MED ORDER — FUROSEMIDE 10 MG/ML IJ SOLN
40.0000 mg | Freq: Once | INTRAMUSCULAR | Status: DC
  Filled 2020-09-30: qty 4

## 2020-09-30 MED ORDER — ASCORBIC ACID 500 MG PO TABS
250.0000 mg | ORAL_TABLET | Freq: Two times a day (BID) | ORAL | Status: DC
  Administered 2020-10-01 – 2020-10-07 (×12): 250 mg via ORAL
  Filled 2020-09-30 (×13): qty 1

## 2020-09-30 MED ORDER — IOHEXOL 9 MG/ML PO SOLN
500.0000 mL | ORAL | Status: AC
Start: 2020-09-30 — End: 2020-09-30
  Administered 2020-09-30 (×2): 500 mL via ORAL

## 2020-09-30 MED ORDER — ADULT MULTIVITAMIN W/MINERALS CH
1.0000 | ORAL_TABLET | Freq: Every day | ORAL | Status: DC
  Administered 2020-10-01 – 2020-10-07 (×7): 1 via ORAL
  Filled 2020-09-30 (×7): qty 1

## 2020-09-30 MED ORDER — SODIUM CHLORIDE 0.9 % IV SOLN: INTRAVENOUS | Status: DC

## 2020-09-30 MED ORDER — DEXTROSE 50 % IV SOLN
INTRAVENOUS | Status: AC
  Administered 2020-09-30: 50 mL
  Filled 2020-09-30: qty 50

## 2020-09-30 MED ORDER — SODIUM CHLORIDE 0.9 % IV BOLUS
500.0000 mL | Freq: Once | INTRAVENOUS | Status: AC
  Administered 2020-09-30: 500 mL via INTRAVENOUS

## 2020-09-30 MED ORDER — IOHEXOL 300 MG/ML  SOLN
60.0000 mL | Freq: Once | INTRAMUSCULAR | Status: AC | PRN
  Administered 2020-09-30: 60 mL via INTRAVENOUS

## 2020-09-30 MED ORDER — APIXABAN 5 MG PO TABS
10.0000 mg | ORAL_TABLET | Freq: Two times a day (BID) | ORAL | Status: AC
  Administered 2020-09-30 – 2020-10-06 (×13): 10 mg via ORAL
  Filled 2020-09-30 (×14): qty 2

## 2020-09-30 NOTE — Progress Notes (Signed)
Lab reported PTT greater than 160.  Hep gtt stopped and Manuela Schwartz, NP notified.

## 2020-09-30 NOTE — Procedures (Signed)
Ultrasound-guided diagnostic and therapeutic right side thoracentesis performed yielding 350 mililiters of amber colored fluid. No immediate complications.  Diagnostic fluid was sent to the lab for further analysis. Follow-up chest x-ray pending. EBL is < 2 ml. Patient unable tolerate additional fluid removal and requested procedure be terminate.

## 2020-09-30 NOTE — Progress Notes (Addendum)
PROGRESS NOTE    Patrick Mccormick  NWG:[id number] DOB: [birth year] DOA: 09/29/2020 PCP: Conan Bowens., MD   Brief Narrative: Taken from H&P. Patrick Mccormick is a 87 y.o. male with medical history significant for pulmonary hypertension, hypertension, history of SIADH, unintended weight loss, history of gastrectomy, persistent atrial fibrillation, iron deficiency anemia, presented to the emergency department for chief concerns of shortness of breath.   At baseline, patient is not on home oxygen.  He endorses shortness of breath x one week. He has unintentionally lost about 40 lbs in 1.5 years (150ish to 110) and extreme exhaustion and does not get hungry. He reports poor appetite.   Found to have bilateral pleural effusions, history of thoracentesis done twice. And hypoxic requiring supplemental oxygen.  Labs pertinent for hyponatremia with sodium of 122.  Patient recently moved from daughter home and most of his care was at [facility] He wants to get established in [place] to avoid traveling.  Subjective: Patient continued to feel little short of breath, stating it is improved as compared to yesterday.  Wife at bedside.  Unintentional weight loss after partial gastrectomy in 2020.  Assessment & Plan:   Principal Problem:   Respiratory distress Active Problems:   Pleural effusion due to congestive heart failure (HCC)   Pulmonary hypertension (HCC)   Atrial fibrillation, chronic (HCC)   History of partial gastrectomy   Protein-calorie malnutrition, severe  Acute hypoxic respiratory failure/bilateral pleural effusion/PE.  CTA positive for PE but this seems chronic.  PE involving left main pulmonary artery and extending into the lingular branches may reflect chronic pulmonary embolism with acute pulmonary embolism less favored however there was antidependent incomplete opacification of the left lower lobe pulmonary arteries could reflect laminar flow artifact however underlying  pulmonary embolism cannot be excluded there is evidence of right heart strain. Also had bilateral pleural effusions s/p right-sided thoracentesis with removal of 350 cc of fluid, further removal was discontinued at patient's request. History of recurrent pleural effusions and had thoracentesis twice in the past 2 years.  Questionable pleural calcification which will need further investigation. Currently saturating 100% on 2 L of oxygen. Echocardiogram with normal EF and pulmonary hypertension.  Elevated BNP. CT abdomen is concerning for anasarca and hepatic congestion more consistent with right-sided heart failure. -Increase the dose of Eliquis to full for treatment. -Wean oxygen as tolerated -Follow-up thoracentesis labs -Pulmonary and vascular surgery consult.  Anasarca.  CT abdomen and pelvis with concern of anasarca and concern of right-sided heart failure.  Patient uses Lasix 20 mg twice daily. Repeat echocardiogram with normal EF, no wall motion abnormalities, normal biventricular systolic and diastolic function, no comment on pulmonary pressure. -We will try Lasix 40 mg IV. -Encourage p.o. hydration as we would like to avoid volume depletion. -Closely monitor volume status. -Daily BMP and weight -Strict intake and output  Unintentional weight loss.  By looking at her providers notes in care everywhere it looks like weight loss started after getting partial gastrectomy in May 2020 secondary to [facility] ulcer.  CT chest with some concern of calcifications at left lung base.  CT abdomen and pelvis was done today which was negative for any concerning mass but did show extensive atherosclerosis. TSH borderline high with normal free T4.  Can be due to hypoalbuminemia -Dietitian consult to improve nutrition. -We will need further work-up as an outpatient -We will check B12 and copper levels due to his history of partial gastrectomy.  Hyponatremia.  Patient has an history of SIADH per  chart  review.  Sodium was within normal limit when last time checked in November 2021. Hyponatremia labs with low serum osmolality low serum osmolality and urinary sodium. -Monitor sodium while he is getting IV Lasix  Severe protein caloric malnutrition.  Most likely secondary to partial gastrectomy. -Dietitian consult.  Pulmonary hypertension.  Patient followed up with pulmonary at [facility] and wants to establish locally. -Pulmonary consult -Continue home dose of sildenafil   Persistent atrial fibrillation/sick sinus s/p permanent pacemaker. Continue home dose of atenolol Increase the dose of Eliquis from 2.5 to full dose for treatment of PE  Diarrhea.  Patient reports some diarrhea on admission.  Denies today.  No bowel movement since in the hospital.  Objective: Vitals:   09/30/20 0746 09/30/20 1112 09/30/20 1155 09/30/20 1206  BP: (!) 87/50 (!) 95/54 99/61 (!) 96/59  Pulse: 64     Resp: 16 18    Temp: (!) 97.3 F (36.3 C) 97.8 F (36.6 C)    TempSrc: Oral     SpO2: 100% 100% 100% 100%  Weight:      Height:        Intake/Output Summary (Last 24 hours) at 09/30/2020 1519 Last data filed at 09/30/2020 1317 Gross per 24 hour  Intake 498.71 ml  Output 515 ml  Net -16.29 ml   Filed Weights   09/29/20 1551 09/29/20 1842 09/30/20 0415  Weight: 47.3 kg 47.6 kg 40.1 kg    Examination:  General exam: Severely malnourished and frail elderly man,appears calm and comfortable  Respiratory system: Clear to auscultation. Respiratory effort normal. Cardiovascular system: S1 & S2 heard, RRR.  Gastrointestinal system: Soft, nontender, nondistended, bowel sounds positive. Central nervous system: Alert and oriented. No focal neurological deficits. Extremities: No edema, no cyanosis, pulses intact and symmetrical. Psychiatry: Judgement and insight appear normal.    DVT prophylaxis: Eliquis Code Status: Partial Family Communication: Wife was updated at bedside Disposition Plan:  Status  is: Inpatient  Remains inpatient appropriate because:Inpatient level of care appropriate due to severity of illness   Dispo: The patient is from: Home              Anticipated d/c is to: Home              Anticipated d/c date is: 2 days              Patient currently is not medically stable to d/c.   Difficult to place patient No              Level of care: Progressive Cardiac  Consultants:   Vascular surgery  Pulmonology  Procedures:  Antimicrobials:   Data Reviewed: I have personally reviewed following labs and imaging studies  CBC: Recent Labs  Lab 09/29/20 1102 09/30/20 0432  WBC 4.0 3.8*  NEUTROABS 2.8  --   HGB 11.4* 10.2*  HCT 33.1* 28.4*  MCV 93.8 91.0  PLT 157 128*   Basic Metabolic Panel: Recent Labs  Lab 09/29/20 1102 09/30/20 0432  NA 122* 122*  K 4.2 4.1  CL 85* 89*  CO2 24 21*  GLUCOSE 75 45*  BUN 35* 35*  CREATININE 1.24 1.24  CALCIUM 9.3 8.9   GFR: Estimated Creatinine Clearance: 24.3 mL/min (by C-G formula based on SCr of 1.24 mg/dL). Liver Function Tests: Recent Labs  Lab 09/29/20 1102  AST 33  ALT 14  ALKPHOS 137*  BILITOT 1.5*  PROT 7.0  ALBUMIN 3.5   No results for input(s): LIPASE, AMYLASE in the last  168 hours. No results for input(s): AMMONIA in the last 168 hours. Coagulation Profile: Recent Labs  Lab 09/29/20 1102  INR 1.6*   Cardiac Enzymes: No results for input(s): CKTOTAL, CKMB, CKMBINDEX, TROPONINI in the last 168 hours. BNP (last 3 results) No results for input(s): PROBNP in the last 8760 hours. HbA1C: No results for input(s): HGBA1C in the last 72 hours. CBG: Recent Labs  Lab 09/30/20 0909 09/30/20 0957 09/30/20 1126  GLUCAP 37* 119* 93   Lipid Profile: No results for input(s): CHOL, HDL, LDLCALC, TRIG, CHOLHDL, LDLDIRECT in the last 72 hours. Thyroid Function Tests: Recent Labs    09/29/20 1310 09/30/20 1152  TSH 5.354*  --   FREET4  --  1.32*   Anemia Panel: No results for input(s):  VITAMINB12, FOLATE, FERRITIN, TIBC, IRON, RETICCTPCT in the last 72 hours. Sepsis Labs: Recent Labs  Lab 09/29/20 1310  PROCALCITON 0.16    Recent Results (from the past 240 hour(s))  Resp Panel by RT-PCR (Flu A&B, Covid) Nasopharyngeal Swab     Status: None   Collection Time: 09/29/20 11:02 AM   Specimen: Nasopharyngeal Swab; Nasopharyngeal(NP) swabs in vial transport medium  Result Value Ref Range Status   SARS Coronavirus 2 by RT PCR NEGATIVE NEGATIVE Final    Comment: (NOTE) SARS-CoV-2 target nucleic acids are NOT DETECTED.  The SARS-CoV-2 RNA is generally detectable in upper respiratory specimens during the acute phase of infection. The lowest concentration of SARS-CoV-2 viral copies this assay can detect is 138 copies/mL. A negative result does not preclude SARS-Cov-2 infection and should not be used as the sole basis for treatment or other patient management decisions. A negative result may occur with  improper specimen collection/handling, submission of specimen other than nasopharyngeal swab, presence of viral mutation(s) within the areas targeted by this assay, and inadequate number of viral copies(<138 copies/mL). A negative result must be combined with clinical observations, patient history, and epidemiological information. The expected result is Negative.  Fact Sheet for Patients:  BloggerCourse.com  Fact Sheet for Healthcare Providers:  SeriousBroker.it  This test is no t yet approved or cleared by the [place] FDA and  has been authorized for detection and/or diagnosis of SARS-CoV-2 by FDA under an Emergency Use Authorization (EUA). This EUA will remain  in effect (meaning this test can be used) for the duration of the COVID-19 declaration under Section 564(b)(1) of the Act, 21 U.S.C.section 360bbb-3(b)(1), unless the authorization is terminated  or revoked sooner.       Influenza A by PCR NEGATIVE  NEGATIVE Final   Influenza B by PCR NEGATIVE NEGATIVE Final    Comment: (NOTE) The Xpert Xpress SARS-CoV-2/FLU/RSV plus assay is intended as an aid in the diagnosis of influenza from Nasopharyngeal swab specimens and should not be used as a sole basis for treatment. Nasal washings and aspirates are unacceptable for Xpert Xpress SARS-CoV-2/FLU/RSV testing.  Fact Sheet for Patients: BloggerCourse.com  Fact Sheet for Healthcare Providers: SeriousBroker.it  This test is not yet approved or cleared by the [place] FDA and has been authorized for detection and/or diagnosis of SARS-CoV-2 by FDA under an Emergency Use Authorization (EUA). This EUA will remain in effect (meaning this test can be used) for the duration of the COVID-19 declaration under Section 564(b)(1) of the Act, 21 U.S.C. section 360bbb-3(b)(1), unless the authorization is terminated or revoked.  Performed at [facility], [address], [place], [place] [address]   MRSA PCR Screening     Status: None  Collection Time: 09/29/20  6:41 PM   Specimen: Nasopharyngeal  Result Value Ref Range Status   MRSA by PCR NEGATIVE NEGATIVE Final    Comment:        The GeneXpert MRSA Assay (FDA approved for NASAL specimens only), is one component of a comprehensive MRSA colonization surveillance program. It is not intended to diagnose MRSA infection nor to guide or monitor treatment for MRSA infections. Performed at [facility], [address] [place], [place] [address]      Radiology Studies: CT Angio Chest PE W and/or Wo Contrast  Addendum Date: 09/29/2020   ADDENDUM REPORT: 09/29/2020 15:35 ADDENDUM: These results were called by telephone at the time of interpretation on 09/29/2020 at 3:35 pm to provider AMY COX , who verbally acknowledged these results. Electronically Signed   By: Kreg Shropshire M.D.   On: 09/29/2020 15:35   Result Date:  09/29/2020 CLINICAL DATA:  Dyspnea EXAM: CT ANGIOGRAPHY CHEST WITH CONTRAST TECHNIQUE: Multidetector CT imaging of the chest was performed using the standard protocol during bolus administration of intravenous contrast. Multiplanar CT image reconstructions and MIPs were obtained to evaluate the vascular anatomy. CONTRAST:  64mL OMNIPAQUE IOHEXOL 350 MG/ML SOLN COMPARISON:  Radiograph 09/29/2020 FINDINGS: Cardiovascular: Satisfactory opacification of pulmonary arteries. Some thin linear web-like bands of hypoattenuation in the distal left main pulmonary artery and lingular branches. Some more anti dependent hypoattenuation within the segmental pulmonary arteries of the left lower lobe have an appearance favored to be laminar flow and mixing artifact though a more subacute to remote pulmonary embolism is not fully excluded. Central pulmonary arteries are enlarged. There is marked cardiomegaly with four-chamber enlargement. Three-vessel coronary artery atherosclerosis is present. Left chest wall pacer pack is noted with leads at the right atrium, coronary sinus and cardiac apex. Trace pericardial effusion. Suboptimal opacification of the aorta for luminal assessment. Atherosclerotic plaque within the normal caliber aorta. Normal 3 vessel branching of the aortic arch. Reflux of contrast in the hepatic veins and IVC. Mediastinum/Nodes: Diffuse edematous changes throughout the mediastinum. Scattered low-attenuation mediastinal and hilar lymph nodes favored to be reactive/edematous. No concerning axillary adenopathy. Fluid-filled, patulous thoracic esophagus. Trachea is unremarkable. No concerning thyroid abnormality though poorly evaluated with superimposed streak artifact. Lungs/Pleura: Complex, bilateral loculated pleural effusions which are predominantly low-attenuation. There are regions of dense basilar pleural calcification in both lungs. A more diffuse pleural thickening is noted in the right lung as well. There  are areas of adjacent passive atelectasis with additional more focal nodular atelectatic changes in the right lung (6/64). Could reflect some rounded atelectasis though underlying infection or disease is not fully excluded. Additional areas of bandlike reticular opacity with architectural distortion and scarring. Diffuse airways thickening and scattered secretions are noted as well. No pneumothorax. Upper Abdomen: Diffuse edematous changes noted throughout the upper abdomen and mesentery with small volume ascites. Extensive atherosclerotic plaque in the upper abdominal aorta. Calcified septation in the infrarenal abdominal aorta may reflect sequela of prior dissection or lamellar calcification of the aorta itself, incompletely assessed on this exam. Postsurgical changes from prior gastric surgery. Musculoskeletal: The osseous structures appear diffusely demineralized which may limit detection of small or nondisplaced fractures. No acute osseous abnormality or suspicious osseous lesion. Degenerative changes are present in the imaged spine and shoulders. Diffuse body wall edema. Review of the MIP images confirms the above findings. IMPRESSION: 1. Web-like filling defects in the distal left main pulmonary artery and extending into the lingular branches may reflect subacute to chronic pulmonary  embolism with acute pulmonary embolism less favored. More antidependent incomplete opacification of the left lower lobe pulmonary arteries could reflect laminar flow artifact though underlying embolism is not excluded. Evaluation for right heart strain is precluded in the setting of diffuse cardiomegaly and four-chamber cardiac enlargement though there is evidence of elevated right heart pressures and right-sided dysfunction with enlarged pulmonary arteries and refluxing contrast into the hepatic veins and IVC. Consider further characterization with echocardiography as clinically warranted. 2. Complex, bilateral loculated pleural  effusions which are predominantly low-attenuation. Associated regions of dense basilar pleural calcification in both lungs. Findings can be on the spectrum of underlying asbestos related pleural disease though the sterility of these collections cannot be fully ascertained on an imaging basis. Extensive areas of passive atelectasis. More focal nodular atelectatic changes in the right lung as well. Could reflect some rounded atelectasis though underlying infection or disease is not fully excluded. Recommend attention on follow-up imaging. 3. Features of anasarca/volume overload with diffuse edematous changes throughout the mediastinum, mediastinum, upper abdomen, mesentery and body wall with small volume abdominal ascites. 4. Aortic Atherosclerosis (ICD10-I70.0). Currently attempting to contact the ordering provider with a critical value result. Addendum will be submitted upon case discussion. Electronically Signed: By: Kreg Shropshire M.D. On: 09/29/2020 15:28   CT ABDOMEN PELVIS W CONTRAST  Result Date: 09/30/2020 CLINICAL DATA:  Unintentional weight loss EXAM: CT ABDOMEN AND PELVIS WITH CONTRAST TECHNIQUE: Multidetector CT imaging of the abdomen and pelvis was performed using the standard protocol following bolus administration of intravenous contrast. CONTRAST:  27mL OMNIPAQUE IOHEXOL 300 MG/ML  SOLN COMPARISON:  None. FINDINGS: Lower chest: Complex moderate right pleural effusion is again identified with associated pleural thickening. Small focus of extrapleural gas within this collection likely relates to recent thoracentesis. There is associated compressive atelectasis of the right lung base. Partially loculated left pleural effusion largely within the left major fissure appears stable since prior CT examination of 09/29/2020. Dense pleural calcifications at the left lung base likely relate to remote trauma or inflammation. Mild global cardiomegaly with particular enlargement of the a right ventricle and right  atrium are again identified. Pacemaker leads are seen within the right heart and left ventricular venous outflow. Extensive calcifications are noted within the coronary arteries. Hepatobiliary: Reflux of contrast into the hepatic venous system is in keeping with at least some degree of right heart failure. Tiny probable cyst within the inferior right hepatic lobe. Liver otherwise unremarkable. No intra or extrahepatic biliary ductal dilation. Gallbladder unremarkable. Pancreas: Unremarkable Spleen: Unremarkable Adrenals/Urinary Tract: The adrenal glands are unremarkable. The kidneys are normal in position. There is mild-to-moderate bilateral renal cortical atrophy noted. Bilateral simple cortical cysts are identified. The kidneys are otherwise unremarkable. The bladder is unremarkable. Stomach/Bowel: Surgical changes of a partial gastrectomy are identified. Moderate sigmoid diverticulosis. The stomach, small bowel, and large bowel are otherwise unremarkable. No evidence of obstruction or focal inflammation. Appendix normal. No free intraperitoneal gas. Mild ascites is present. Vascular/Lymphatic: There is extensive aortoiliac atherosclerotic calcification with particularly prominent atherosclerotic calcification at the origin of the a renal and mesenteric arterial vasculature almost certainly resulting in hemodynamically significant stenoses, not well characterized on this examination. Short segment focal dissection within the infrarenal abdominal aorta, likely the result of a remote penetrating atherosclerotic ulcer results in mild ectasia of the infrarenal abdominal aorta without frank aneurysm formation, with a maximal transaxial dimension of 2.6 cm. The common iliac arteries are ectatic bilaterally, left greater than right, without frank aneurysm formation. Extensive atherosclerotic calcification  is noted within the lower extremity arterial inflow and visualized outflow. Reproductive: Prostate is unremarkable.  Other: There is extensive subcutaneous edema noted throughout the body wall as well as retroperitoneal edema in keeping with changes of anasarca. Musculoskeletal: Degenerative changes are seen within the lumbar spine. No suspicious lytic or blastic bone lesions are seen. Degenerative changes are noted within the hips bilaterally. No acute bone abnormality. IMPRESSION: Complex right hydropneumothorax with small gaseous component likely related to recent thoracentesis. Cardiomegaly and extensive coronary artery calcification. Large mint of the right heart and reflux of contrast into the hepatic venous system in keeping with at least some degree of right heart failure. Dense calcification at the left lung base likely result of remote trauma or inflammation. Diffuse body wall subcutaneous edema, mild ascites, and retroperitoneal edema most in keeping with moderate anasarca, possibly the result of cardiogenic failure. Peripheral vascular disease with extensive atherosclerotic calcification at the origin of the mesenteric and renal vasculature almost certainly resulting in hemodynamically significant stenosis. Clinical correlation for signs and symptoms of chronic mesenteric ischemia and/or significant renal artery stenosis is recommended. Aortic Atherosclerosis (ICD10-I70.0). Electronically Signed   By: Helyn Numbers MD   On: 09/30/2020 13:09   DG Chest Port 1 View  Result Date: 09/30/2020 CLINICAL DATA:  Status post right-sided thoracentesis. EXAM: PORTABLE CHEST 1 VIEW COMPARISON:  CT a chest in chest x-ray from yesterday. FINDINGS: Unchanged left chest wall pacemaker. Stable cardiomegaly and pulmonary artery enlargement. Loculated bilateral pleural effusions again noted, slightly decreased on the right status post thoracentesis. No pneumothorax. Unchanged pleural calcification at the lung bases and bilateral lower lobe atelectasis. No acute osseous abnormality. IMPRESSION: 1. Loculated bilateral pleural  effusions, slightly decreased on the right status post thoracentesis. No pneumothorax. Electronically Signed   By: Obie Dredge M.D.   On: 09/30/2020 12:34   DG Chest Portable 1 View  Result Date: 09/29/2020 CLINICAL DATA:  Dyspnea EXAM: PORTABLE CHEST 1 VIEW COMPARISON:  None. FINDINGS: Bilateral basilar laterally loculated pleural effusions are present, moderate on the right and small on the left. Left basilar pleural calcifications are identified. There is enlargement of the right hilum suggesting presence of right hilar mass or right hilar adenopathy. There is partial right lower lobe collapse. Opacity within the left perihilar region may represent fluid within the left major fissure. A left perihilar mass, however, is difficult to exclude. No pneumothorax. Mild cardiomegaly. Left subclavian 3 lead pacemaker in place. Vascular calcifications are seen within the abdominal aorta. The pulmonary vascularity is normal. No acute bone abnormality. IMPRESSION: Suspected right hilar mass or adenopathy with subtotal collapse of the right lower lobe possibly representing postobstructive collapse. Bilateral loculated pleural effusions, moderate on the right and small on the left. Coarse left basilar pleural calcification may relate to remote trauma or infection. Left perihilar opacity possibly representing fluid within the fissure or a left perihilar focal pulmonary mass. This would be better assessed with dedicated contrast enhanced CT imaging. Mild cardiomegaly. Electronically Signed   By: Helyn Numbers MD   On: 09/29/2020 11:08   ECHOCARDIOGRAM COMPLETE  Result Date: 09/30/2020    ECHOCARDIOGRAM REPORT   Patient Name:   RANIER COACH Date of Exam: 09/30/2020 Medical Rec #:  [id number]       Height:       67.0 in Accession #:    [id number]      Weight:       88.3 lb Date of Birth:  [birth year]       BSA:  1.429 m Patient Age:    88 years        BP:           87/50 mmHg Patient Gender: M                HR:           76 bpm. Exam Location:  [facility] Procedure: 2D Echo, Color Doppler and Cardiac Doppler Indications:     R06.00 Dyspnea  History:         Patient has no prior history of Echocardiogram examinations.                  Arrythmias:Paroxymal atrial fibrillation,                  Signs/Symptoms:Shortness of Breath; Risk Factors:Hypertension.  Sonographer:     Humphrey Rolls RDCS (AE) Referring Phys:  [id number] AMY N COX Diagnosing Phys: Harold Hedge MD  Sonographer Comments: TDS due to bony thorax. IMPRESSIONS  1. Left ventricular ejection fraction, by estimation, is 65 to 70%. The left ventricle has normal function. The left ventricle has no regional wall motion abnormalities. Left ventricular diastolic parameters were normal.  2. Right ventricular systolic function is normal. The right ventricular size is mildly enlarged.  3. Left atrial size was mildly dilated.  4. Right atrial size was mildly dilated.  5. The mitral valve is grossly normal. Mild to moderate mitral valve regurgitation.  6. The aortic valve is calcified. Aortic valve regurgitation is trivial. Mild to moderate aortic valve sclerosis/calcification is present, without any evidence of aortic stenosis. FINDINGS  Left Ventricle: Left ventricular ejection fraction, by estimation, is 65 to 70%. The left ventricle has normal function. The left ventricle has no regional wall motion abnormalities. The left ventricular internal cavity size was normal in size. There is  borderline left ventricular hypertrophy. Left ventricular diastolic parameters were normal. Right Ventricle: The right ventricular size is mildly enlarged. No increase in right ventricular wall thickness. Right ventricular systolic function is normal. Left Atrium: Left atrial size was mildly dilated. Right Atrium: Right atrial size was mildly dilated. Pericardium: There is no evidence of pericardial effusion. Mitral Valve: The mitral valve is grossly normal. Mild to moderate mitral valve  regurgitation. MV peak gradient, 3.1 mmHg. The mean mitral valve gradient is 1.0 mmHg. Tricuspid Valve: The tricuspid valve is not well visualized. Tricuspid valve regurgitation is trivial. Aortic Valve: The aortic valve is calcified. Aortic valve regurgitation is trivial. Mild to moderate aortic valve sclerosis/calcification is present, without any evidence of aortic stenosis. Aortic valve mean gradient measures 3.0 mmHg. Aortic valve peak  gradient measures 5.9 mmHg. Aortic valve area, by VTI measures 1.71 cm. Pulmonic Valve: The pulmonic valve was not well visualized. Pulmonic valve regurgitation is trivial. Aorta: The aortic root is normal in size and structure. IAS/Shunts: No atrial level shunt detected by color flow Doppler. Additional Comments: A pacer wire is visualized.  LEFT VENTRICLE PLAX 2D LVIDd:         3.50 cm LVIDs:         2.10 cm LV PW:         1.10 cm LV IVS:        0.80 cm LVOT diam:     2.20 cm LV SV:         38 LV SV Index:   26 LVOT Area:     3.80 cm  RIGHT VENTRICLE RV Basal diam:  4.80 cm LEFT ATRIUM  Index       RIGHT ATRIUM           Index LA diam:        4.10 cm 2.87 cm/m  RA Area:     30.00 cm LA Vol (A2C):   40.4 ml 28.28 ml/m RA Volume:   112.00 ml 78.39 ml/m LA Vol (A4C):   92.3 ml 64.61 ml/m LA Biplane Vol: 65.3 ml 45.71 ml/m  AORTIC VALVE                   PULMONIC VALVE AV Area (Vmax):    1.97 cm    PV Vmax:       0.68 m/s AV Area (Vmean):   1.92 cm    PV Vmean:      42.900 cm/s AV Area (VTI):     1.71 cm    PV VTI:        0.128 m AV Vmax:           121.00 cm/s PV Peak grad:  1.9 mmHg AV Vmean:          79.000 cm/s PV Mean grad:  1.0 mmHg AV VTI:            0.220 m AV Peak Grad:      5.9 mmHg AV Mean Grad:      3.0 mmHg LVOT Vmax:         62.70 cm/s LVOT Vmean:        40.000 cm/s LVOT VTI:          0.099 m LVOT/AV VTI ratio: 0.45  AORTA Ao Root diam: 3.50 cm MITRAL VALVE MV Area (PHT): 6.96 cm    SHUNTS MV Area VTI:   2.36 cm    Systemic VTI:  0.10 m MV Peak  grad:  3.1 mmHg    Systemic Diam: 2.20 cm MV Mean grad:  1.0 mmHg MV Vmax:       0.88 m/s MV Vmean:      47.8 cm/s MV Decel Time: 109 msec MV E velocity: 75.80 cm/s MV A velocity: 36.80 cm/s MV E/A ratio:  2.06 Harold Hedge MD Electronically signed by Harold Hedge MD Signature Date/Time: 09/30/2020/11:36:34 AM    Final     Scheduled Meds: . allopurinol  100 mg Oral Daily  . apixaban  10 mg Oral BID   Followed by  . [START ON 10/07/2020] apixaban  5 mg Oral BID  . [START ON 10/01/2020] vitamin C  250 mg Oral BID  . atenolol  50 mg Oral Daily  . atorvastatin  20 mg Oral Daily  . feeding supplement  237 mL Oral TID BM  . mirtazapine  7.5 mg Oral QHS  . [START ON 10/01/2020] multivitamin with minerals  1 tablet Oral Daily  . sildenafil  20 mg Oral TID   Continuous Infusions: . sodium chloride       LOS: 0 days   Time spent: 45 minutes.  [name], MD [facility]  If 7PM-7AM, please contact night-coverage [email or web address]  09/30/2020, 3:19 PM   This record has been created using Conservation officer, historic buildings. Errors have been sought and corrected,but may not always be located. Such creation errors do not reflect on the standard of care.

## 2020-09-30 NOTE — Progress Notes (Signed)
OT Cancellation Note  Patient Details Name: Gean Larose MRN: [id number] DOB: 1932-03-27   Cancelled Treatment:    Reason Eval/Treat Not Completed: Patient at procedure or test/ unavailable  Pt OTF to [place] at this time. Will f/u at later date/time as able for OT evaluation. Thank you.  Rejeana Brock, MS, OTR/L ascom [phone] 09/30/20, 11:55 AM

## 2020-09-30 NOTE — Progress Notes (Signed)
pts FSBS 37- pt alert and speaking / iv dextrose given/ fsbs improved 119/ pt BP low - pt asymptomatic/ MD made aware

## 2020-09-30 NOTE — Progress Notes (Signed)
PT Cancellation Note  Patient Details Name: Patrick Mccormick MRN: [id number] DOB: 03-Oct-1931   Cancelled Treatment:    Reason Eval/Treat Not Completed: Medical issues which prohibited therapy (Pt not medically ready for PT at this time time. Pt still within 48hr anticoagulation period s/p findings of PE. Pt also with Na+: 122. WIll evaluate once medically ready.)   Wendy Hoback C 09/30/2020, 1:42 PM

## 2020-09-30 NOTE — Progress Notes (Signed)
*  PRELIMINARY RESULTS* Echocardiogram 2D Echocardiogram has been performed.  Joanette Gula Josslynn Mentzer 09/30/2020, 10:26 AM

## 2020-09-30 NOTE — Progress Notes (Signed)
Initial Nutrition Assessment  DOCUMENTATION CODES:   Severe malnutrition in context of chronic illness  INTERVENTION:   Ensure Enlive po TID, each supplement provides 350 kcal and 20 grams of protein  Magic cup TID with meals, each supplement provides 290 kcal and 9 grams of protein  MVI daily   Vitamin C 251m po BID   Recommend check copper and B12 labs as pt with h/o partial gastrectomy.   Pt at high refeed risk; recommend monitor potassium, magnesium and phosphorus labs daily until stable  NUTRITION DIAGNOSIS:   Severe Malnutrition related to chronic illness (pulmonary HTN, CHF, h/o gastrectomy) as evidenced by 27 percent weight loss in one year,severe fat depletion,severe muscle depletion.  GOAL:   Patient will meet greater than or equal to 90% of their needs  MONITOR:   PO intake,Supplement acceptance,Labs,Weight trends,Skin,I & O's  REASON FOR ASSESSMENT:   Consult Assessment of nutrition requirement/status  ASSESSMENT:   87y.o. 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  Pt s/p thoracentesis today with 3525moutput   Met with pt and pt's wife in room today. Pt reports poor appetite and oral intake for at least one week pta. Pt reports that he has just been tired and has no desire to eat. Pt reports a significant weight loss over the past several months. Per chart, pt is down 40lbs(27%) over the past year; this is severe weight loss. Pt reports that he has been drinking some vanilla Ensure at home but he is starting to get tired of it. Pt reports that breakfast is his best meal; pt did eat some cereal with fruit for breakfast this morning. RD discussed with  pt the importance of adequate nutrition needed to preserve lean muscle. RD also discussed with patient ways to get increased calories and protein throughout the day and to focus on nutrient dense foods. RD will add supplements and vitamins to help pt meet his estimated needs. Pt is at high refeed risk. RD will check a copper and B12 lab as pt with h/o gastrectomy.   Medications reviewed and include: allopurinol, remeron  Labs reviewed: Na 122(L), BUN 35(H) BNP- 1945(H)- 2/10 Wbc- 3.8(L), Hgb 10.2(L), Hct 28.4(L) cbgs- 37, 119, 93 x 24 hrs  NUTRITION - FOCUSED PHYSICAL EXAM:  Flowsheet Row Most Recent Value  Orbital Region Severe depletion  Upper Arm Region Severe depletion  Thoracic and Lumbar Region Severe depletion  Buccal Region Severe depletion  Temple Region Severe depletion  Clavicle Bone Region Severe depletion  Clavicle and Acromion Bone Region Severe depletion  Scapular Bone Region Severe depletion  Dorsal Hand Severe depletion  Patellar Region Severe depletion  Anterior Thigh Region Severe depletion  Posterior Calf Region Severe depletion  Edema (RD Assessment) None  Hair Reviewed  Eyes Reviewed  Mouth Reviewed  Skin Reviewed  Nails Reviewed     Diet Order:   Diet Order            Diet regular Room service appropriate? Yes; Fluid consistency: Thin  Diet effective now                EDUCATION NEEDS:   Education needs have been addressed  Skin:  Skin Assessment: Reviewed RN Assessment  Last BM:  2/11- type 7  Height:  Ht Readings from Last 1 Encounters:  09/29/20 '5\' 7"'  (1.702 m)    Weight:   Wt Readings from Last 1 Encounters:  09/30/20 40.1 kg    Ideal Body Weight:  67.27 kg  BMI:  Body mass index is 13.83 kg/m.  Estimated Nutritional Needs:   Kcal:  1600-1800kcal/day  Protein:  80-90g/day  Fluid:  1.2-1.4L/day  Koleen Distance MS, RD, LDN Please refer to [facility] for RD and/or RD on-call/weekend/after hours pager

## 2020-09-30 NOTE — Progress Notes (Signed)
pts BP low- pt asymptomatic/ MD made aware/ orders to give NS bolus/ will monitor.

## 2020-09-30 NOTE — Consult Note (Signed)
ANTICOAGULATION CONSULT NOTE - Initial Consult  Pharmacy Consult for heparin infusion Indication: pulmonary embolus  Height: 5\' 7"  (170.2 cm) Weight: 47.6 kg (105 lb) IBW/kg (Calculated) : 66.1Vital Signs: Temp: 97.9 F (36.6 C) (02/10 2154) Temp Source: Oral (02/10 2154) BP: 114/74 (02/10 2154) Pulse Rate: 71 (02/10 2154)  Labs: Recent Labs    09/29/20 1102 09/29/20 1310 09/30/20 0111  HGB 11.4*  --   --   HCT 33.1*  --   --   PLT 157  --   --   APTT 45*  --  >160*  LABPROT 18.6*  --   --   INR 1.6*  --   --   HEPARINUNFRC 1.59*  --   --   CREATININE 1.24  --   --   TROPONINIHS 33* 37*  --     Estimated Creatinine Clearance: 28.8 mL/min (by C-G formula based on SCr of 1.24 mg/dL).   Medical History: History reviewed. No pertinent past medical history.  Medications:  Apixaban 2.5 mg BID on home medication list -- will assume last dose this AM  Assessment: 87 y/o male with PMH of Pulmonary HTN, afib on apixaban presents with SOB x 1 week. Chest CT c/f subacute to chronic pulmonary embolism. Patient on reduced apixaban 2.5 mg BID PTA for afib. Pharmacy has been consulted for heparin initiation and management for PE.  Baseline aPTT, INR, CBC, and HL ordered and reviewed  Goal of Therapy:  Heparin level 0.3-0.7 units/ml aPTT 66-102 units/ml Monitor platelets by anticoagulation protocol: Yes   02/11 0111 aPTT > 160  Plan:  Hold infusion for 1 hour then restart Reduce heparin infusion rate to 650 units/hr Given baseline HL elevated, will monitor by aPTT levels until heparin level and aPTT correlate  Check aPTT  level in 8 hours after restart then daily while on heparin Continue to monitor H&H and platelets  04/11, PharmD, [facility] 09/30/2020 2:21 AM

## 2020-09-30 NOTE — Progress Notes (Signed)
Pharmacy notified of PTT level and orders received.  Hep gtt stopped.

## 2020-10-01 ENCOUNTER — Inpatient Hospital Stay: Payer: Medicare HMO

## 2020-10-01 DIAGNOSIS — R0603 Acute respiratory distress: Secondary | ICD-10-CM | POA: Diagnosis not present

## 2020-10-01 LAB — CBC
HCT: 28.8 % — ABNORMAL LOW (ref 39.0–52.0)
Hemoglobin: 9.8 g/dL — ABNORMAL LOW (ref 13.0–17.0)
MCH: 32.1 pg (ref 26.0–34.0)
MCHC: 34 g/dL (ref 30.0–36.0)
MCV: 94.4 fL (ref 80.0–100.0)
Platelets: 130 10*3/uL — ABNORMAL LOW (ref 150–400)
RBC: 3.05 MIL/uL — ABNORMAL LOW (ref 4.22–5.81)
RDW: 14.5 % (ref 11.5–15.5)
WBC: 4.7 10*3/uL (ref 4.0–10.5)
nRBC: 0 % (ref 0.0–0.2)

## 2020-10-01 LAB — BASIC METABOLIC PANEL
Anion gap: 9 (ref 5–15)
Anion gap: 8 (ref 5–15)
BUN: 40 mg/dL — ABNORMAL HIGH (ref 8–23)
BUN: 46 mg/dL — ABNORMAL HIGH (ref 8–23)
CO2: 24 mmol/L (ref 22–32)
CO2: 23 mmol/L (ref 22–32)
Calcium: 9 mg/dL (ref 8.9–10.3)
Calcium: 9 mg/dL (ref 8.9–10.3)
Chloride: 89 mmol/L — ABNORMAL LOW (ref 98–111)
Chloride: 90 mmol/L — ABNORMAL LOW (ref 98–111)
Creatinine, Ser: 1.71 mg/dL — ABNORMAL HIGH (ref 0.61–1.24)
Creatinine, Ser: 1.81 mg/dL — ABNORMAL HIGH (ref 0.61–1.24)
GFR, Estimated: 39 mL/min — ABNORMAL LOW (ref >60)
GFR, Estimated: 36 mL/min — ABNORMAL LOW (ref >60)
Glucose, Bld: 89 mg/dL (ref 70–99)
Glucose, Bld: 94 mg/dL (ref 70–99)
Potassium: 4.3 mmol/L (ref 3.5–5.1)
Potassium: 4.5 mmol/L (ref 3.5–5.1)
Sodium: 122 mmol/L — ABNORMAL LOW (ref 135–145)
Sodium: 121 mmol/L — ABNORMAL LOW (ref 135–145)

## 2020-10-01 LAB — PROTEIN, BODY FLUID (OTHER): Total Protein, Body Fluid Other: 2.5 g/dL

## 2020-10-01 LAB — GLUCOSE, CAPILLARY: Glucose-Capillary: 217 mg/dL — ABNORMAL HIGH (ref 70–99)

## 2020-10-01 LAB — VITAMIN B12: Vitamin B-12: 1616 pg/mL — ABNORMAL HIGH (ref 180–914)

## 2020-10-01 LAB — T3: T3, Total: 55 ng/dL — ABNORMAL LOW (ref 71–180)

## 2020-10-01 LAB — LACTATE DEHYDROGENASE: LDH: 102 U/L (ref 98–192)

## 2020-10-01 MED ORDER — MIDODRINE HCL 5 MG PO TABS
10.0000 mg | ORAL_TABLET | Freq: Three times a day (TID) | ORAL | Status: DC
  Administered 2020-10-01 (×2): 10 mg via ORAL
  Filled 2020-10-01 (×2): qty 2

## 2020-10-01 MED ORDER — SALINE SPRAY 0.65 % NA SOLN
1.0000 | NASAL | Status: DC | PRN
  Filled 2020-10-01: qty 44

## 2020-10-01 MED ORDER — ALBUMIN HUMAN 25 % IV SOLN
25.0000 g | Freq: Once | INTRAVENOUS | Status: AC
  Administered 2020-10-01: 25 g via INTRAVENOUS
  Filled 2020-10-01: qty 100

## 2020-10-01 MED ORDER — HYDROCORTISONE NA SUCCINATE PF 100 MG IJ SOLR
50.0000 mg | Freq: Four times a day (QID) | INTRAMUSCULAR | Status: DC
  Administered 2020-10-01 – 2020-10-03 (×10): 50 mg via INTRAVENOUS
  Filled 2020-10-01 (×14): qty 1

## 2020-10-01 MED ORDER — MIDODRINE HCL 5 MG PO TABS
10.0000 mg | ORAL_TABLET | Freq: Four times a day (QID) | ORAL | Status: DC
Start: 2020-10-02 — End: 2020-10-06
  Administered 2020-10-02 – 2020-10-05 (×16): 10 mg via ORAL
  Filled 2020-10-01 (×18): qty 2

## 2020-10-01 NOTE — Progress Notes (Signed)
PROGRESS NOTE    Patrick Mccormick  ION:[id number] DOB: Jun 14, 1932 DOA: 09/29/2020 PCP: Conan Bowens., MD   Brief Narrative: Taken from H&P. Patrick Mccormick is a 87 y.o. male with medical history significant for pulmonary hypertension, hypertension, history of SIADH, unintended weight loss, history of gastrectomy, persistent atrial fibrillation, iron deficiency anemia, presented to the emergency department for chief concerns of shortness of breath.   At baseline, patient is not on home oxygen.  He endorses shortness of breath x one week. He has unintentionally lost about 40 lbs in 1.5 years (150ish to 110) and extreme exhaustion and does not get hungry. He reports poor appetite.   Found to have bilateral pleural effusions, history of thoracentesis done twice. And hypoxic requiring supplemental oxygen.  Labs pertinent for hyponatremia with sodium of 122.  Patient recently moved from daughter home and most of his care was at [facility] He wants to get established in [place] to avoid traveling.  Subjective: Patient seems clinically improving, appears little more comfortable, no new complaint.  Wife at bedside.  Assessment & Plan:   Principal Problem:   Respiratory distress Active Problems:   Pleural effusion due to congestive heart failure (HCC)   Pulmonary hypertension (HCC)   Atrial fibrillation, chronic (HCC)   History of partial gastrectomy   Protein-calorie malnutrition, severe  Acute hypoxic respiratory failure/bilateral pleural effusion/PE.  CTA positive for PE but this seems chronic.  PE involving left main pulmonary artery and extending into the lingular branches may reflect chronic pulmonary embolism with acute pulmonary embolism less favored however there was antidependent incomplete opacification of the left lower lobe pulmonary arteries could reflect laminar flow artifact however underlying pulmonary embolism cannot be excluded there is evidence of right heart  strain. Also had bilateral pleural effusions s/p right-sided thoracentesis with removal of 350 cc of fluid, further removal was discontinued at patient's request. History of recurrent pleural effusions and had thoracentesis twice in the past 2 years.  Questionable pleural calcification which will need further investigation. Currently saturating on room air. Echocardiogram with normal EF and pulmonary hypertension.  Elevated BNP. CT abdomen is concerning for anasarca and hepatic congestion more consistent with right-sided heart failure. Thoracentesis labs so far borderline exudative with lymphocytic predominant, culture and cytology pending. -Increase the dose of Eliquis to full for treatment. -Pulmonary was consulted-appreciate their recommendations. -Patient was started on midodrine with Lasix by pulmonary. -Liver ultrasound Doppler ordered as there was some mild ascites on CT abdomen.  Anasarca.  CT abdomen and pelvis with concern of anasarca and concern of right-sided heart failure.  Patient uses Lasix 20 mg twice daily. Repeat echocardiogram with normal EF, no wall motion abnormalities, normal biventricular systolic and diastolic function, no comment on pulmonary pressure. -We will try Lasix 40 mg IV with the help of midodrine. -Encourage p.o. hydration as we would like to avoid volume depletion. -Closely monitor volume status. -Daily BMP and weight -Strict intake and output  Unintentional weight loss.  By looking at her providers notes in care everywhere it looks like weight loss started after getting partial gastrectomy in May 2020 secondary to [facility] ulcer.  CT chest with some concern of calcifications at left lung base.  CT abdomen and pelvis was done today which was negative for any concerning mass but did show extensive atherosclerosis. TSH borderline high with normal free T4 and low T3.  Vitamin B12 elevated, copper levels pending which were checked as he has partial  gastrectomy. -We will start him on Synthroid-will need a  repeat check in 4 weeks and a close follow-up with PCP for optimization of dose. -Dietitian consult to improve nutrition. -We will need further work-up as an outpatient  Hyponatremia.  Patient has an history of SIADH per chart review.  Sodium was within normal limit when last time checked in November 2021. Hyponatremia labs with low serum osmolality low serum osmolality and urinary sodium.  Some concern of secondary to liver disease as patient drinks alcohol regularly. -Monitor sodium while he is getting IV Lasix  Severe protein caloric malnutrition.  Most likely secondary to partial gastrectomy. -Dietitian consult.  Pulmonary hypertension.  Patient followed up with pulmonary at [facility] and wants to establish locally. -Pulmonary consult -Pulmonary hold sildenafil.  Persistent atrial fibrillation/sick sinus s/p permanent pacemaker. Continue home dose of atenolol Increase the dose of Eliquis from 2.5 to full dose for treatment of PE  Diarrhea.  Patient reports some diarrhea on admission.    No bowel movement since in the hospital.  Objective: Vitals:   09/30/20 2342 10/01/20 0413 10/01/20 0802 10/01/20 1155  BP: (!) 91/53 (!) 97/56 109/61 112/76  Pulse: (!) 59 64 75 80  Resp: 16 16 16 16   Temp: 98.5 F (36.9 C) 98.6 F (37 C) 97.8 F (36.6 C) 98.4 F (36.9 C)  TempSrc: Oral  Oral Oral  SpO2: 100% 99% 100% 95%  Weight:  38.8 kg    Height:        Intake/Output Summary (Last 24 hours) at 10/01/2020 1447 Last data filed at 10/01/2020 0414 Gross per 24 hour  Intake --  Output 125 ml  Net -125 ml   Filed Weights   09/29/20 1842 09/30/20 0415 10/01/20 0413  Weight: 47.6 kg 40.1 kg 38.8 kg    Examination:  General.  Very cachectic elderly man, in no acute distress. Pulmonary.  Lungs clear bilaterally, normal respiratory effort. CV.  Regular rate and rhythm, no JVD, rub or murmur. Abdomen.  Soft, nontender,  nondistended, BS positive. CNS.  Alert and oriented x3.  No focal neurologic deficit. Extremities.  No edema, no cyanosis, pulses intact and symmetrical. Psychiatry.  Judgment and insight appears normal.  DVT prophylaxis: Eliquis Code Status: Partial Family Communication: Wife was updated at bedside Disposition Plan:  Status is: Inpatient  Remains inpatient appropriate because:Inpatient level of care appropriate due to severity of illness   Dispo: The patient is from: Home              Anticipated d/c is to: Home              Anticipated d/c date is: 2 days              Patient currently is not medically stable to d/c.   Difficult to place patient No              Level of care: Progressive Cardiac  Consultants:   Vascular surgery  Pulmonology  Procedures:  Antimicrobials:   Data Reviewed: I have personally reviewed following labs and imaging studies  CBC: Recent Labs  Lab 09/29/20 1102 09/30/20 0432 10/01/20 0432  WBC 4.0 3.8* 4.7  NEUTROABS 2.8  --   --   HGB 11.4* 10.2* 9.8*  HCT 33.1* 28.4* 28.8*  MCV 93.8 91.0 94.4  PLT 157 128* 130*   Basic Metabolic Panel: Recent Labs  Lab 09/29/20 1102 09/30/20 0432 09/30/20 1614 10/01/20 0432  NA 122* 122* 120* 122*  K 4.2 4.1 4.0 4.3  CL 85* 89* 88* 89*  CO2 24  21* 22 24  GLUCOSE 75 45* 104* 89  BUN 35* 35* 34* 40*  CREATININE 1.24 1.24 1.24 1.71*  CALCIUM 9.3 8.9 8.7* 9.0   GFR: Estimated Creatinine Clearance: 17 mL/min (A) (by C-G formula based on SCr of 1.71 mg/dL (H)). Liver Function Tests: Recent Labs  Lab 09/29/20 1102  AST 33  ALT 14  ALKPHOS 137*  BILITOT 1.5*  PROT 7.0  ALBUMIN 3.5   No results for input(s): LIPASE, AMYLASE in the last 168 hours. No results for input(s): AMMONIA in the last 168 hours. Coagulation Profile: Recent Labs  Lab 09/29/20 1102  INR 1.6*   Cardiac Enzymes: No results for input(s): CKTOTAL, CKMB, CKMBINDEX, TROPONINI in the last 168 hours. BNP (last 3  results) No results for input(s): PROBNP in the last 8760 hours. HbA1C: No results for input(s): HGBA1C in the last 72 hours. CBG: Recent Labs  Lab 09/30/20 0909 09/30/20 0957 09/30/20 1126  GLUCAP 37* 119* 93   Lipid Profile: No results for input(s): CHOL, HDL, LDLCALC, TRIG, CHOLHDL, LDLDIRECT in the last 72 hours. Thyroid Function Tests: Recent Labs    09/29/20 1310 09/30/20 1152  TSH 5.354*  --   FREET4  --  1.32*   Anemia Panel: Recent Labs    10/01/20 0432  VITAMINB12 1,616*   Sepsis Labs: Recent Labs  Lab 09/29/20 1310  PROCALCITON 0.16    Recent Results (from the past 240 hour(s))  Resp Panel by RT-PCR (Flu A&B, Covid) Nasopharyngeal Swab     Status: None   Collection Time: 09/29/20 11:02 AM   Specimen: Nasopharyngeal Swab; Nasopharyngeal(NP) swabs in vial transport medium  Result Value Ref Range Status   SARS Coronavirus 2 by RT PCR NEGATIVE NEGATIVE Final    Comment: (NOTE) SARS-CoV-2 target nucleic acids are NOT DETECTED.  The SARS-CoV-2 RNA is generally detectable in upper respiratory specimens during the acute phase of infection. The lowest concentration of SARS-CoV-2 viral copies this assay can detect is 138 copies/mL. A negative result does not preclude SARS-Cov-2 infection and should not be used as the sole basis for treatment or other patient management decisions. A negative result may occur with  improper specimen collection/handling, submission of specimen other than nasopharyngeal swab, presence of viral mutation(s) within the areas targeted by this assay, and inadequate number of viral copies(<138 copies/mL). A negative result must be combined with clinical observations, patient history, and epidemiological information. The expected result is Negative.  Fact Sheet for Patients:  BloggerCourse.comhttps://www.fda.gov/media/152166/download  Fact Sheet for Healthcare Providers:  SeriousBroker.ithttps://www.fda.gov/media/152162/download  This test is no t yet approved or  cleared by the [place] FDA and  has been authorized for detection and/or diagnosis of SARS-CoV-2 by FDA under an Emergency Use Authorization (EUA). This EUA will remain  in effect (meaning this test can be used) for the duration of the COVID-19 declaration under Section 564(b)(1) of the Act, 21 U.S.C.section 360bbb-3(b)(1), unless the authorization is terminated  or revoked sooner.       Influenza A by PCR NEGATIVE NEGATIVE Final   Influenza B by PCR NEGATIVE NEGATIVE Final    Comment: (NOTE) The Xpert Xpress SARS-CoV-2/FLU/RSV plus assay is intended as an aid in the diagnosis of influenza from Nasopharyngeal swab specimens and should not be used as a sole basis for treatment. Nasal washings and aspirates are unacceptable for Xpert Xpress SARS-CoV-2/FLU/RSV testing.  Fact Sheet for Patients: BloggerCourse.comhttps://www.fda.gov/media/152166/download  Fact Sheet for Healthcare Providers: SeriousBroker.ithttps://www.fda.gov/media/152162/download  This test is not yet approved or cleared by the [place] FDA and has been authorized for detection and/or diagnosis of SARS-CoV-2 by FDA under an Emergency Use Authorization (EUA). This EUA will remain in effect (meaning this test can be used) for the duration of the COVID-19 declaration under Section 564(b)(1) of the Act, 21 U.S.C. section 360bbb-3(b)(1), unless the authorization is terminated or revoked.  Performed at [facility], [address] [place], [place] [address]   MRSA PCR Screening     Status: None   Collection Time: 09/29/20  6:41 PM   Specimen: Nasopharyngeal  Result Value Ref Range Status   MRSA by PCR NEGATIVE NEGATIVE Final    Comment:        The GeneXpert MRSA Assay (FDA approved for NASAL specimens only), is one component of a comprehensive MRSA colonization surveillance program. It is not intended to diagnose MRSA infection nor to guide or monitor treatment for MRSA infections. Performed at [facility],  [address] [place], [place] [address]   Body fluid culture     Status: None (Preliminary result)   Collection Time: 09/30/20 12:00 PM   Specimen: PATH Cytology Pleural fluid  Result Value Ref Range Status   Specimen Description   Final    PLEURAL Performed at [facility], [address] [place], [place] [address]    Special Requests   Final    NONE Performed at [facility], [address] [place], [place] [address]    Gram Stain NO WBC SEEN NO ORGANISMS SEEN   Final   Culture   Final    NO GROWTH < 24 HOURS Performed at [facility], [address], [place], [place] [address]    Report Status PENDING  Incomplete     Radiology Studies: CT Angio Chest PE W and/or Wo Contrast  Addendum Date: 09/29/2020   ADDENDUM REPORT: 09/29/2020 15:35 ADDENDUM: These results were called by telephone at the time of interpretation on 09/29/2020 at 3:35 pm to provider AMY COX , who verbally acknowledged these results. Electronically Signed   By: Kreg Shropshire M.D.   On: 09/29/2020 15:35   Result Date: 09/29/2020 CLINICAL DATA:  Dyspnea EXAM: CT ANGIOGRAPHY CHEST WITH CONTRAST TECHNIQUE: Multidetector CT imaging of the chest was performed using the standard protocol during bolus administration of intravenous contrast. Multiplanar CT image reconstructions and MIPs were obtained to evaluate the vascular anatomy. CONTRAST:  75mL OMNIPAQUE IOHEXOL 350 MG/ML SOLN COMPARISON:  Radiograph 09/29/2020 FINDINGS: Cardiovascular: Satisfactory opacification of pulmonary arteries. Some thin linear web-like bands of hypoattenuation in the distal left main pulmonary artery and lingular branches. Some more anti dependent hypoattenuation within the segmental pulmonary arteries of the left lower lobe have an appearance favored to be laminar flow and mixing artifact though a more subacute to remote pulmonary embolism is not fully excluded. Central pulmonary arteries are enlarged. There is  marked cardiomegaly with four-chamber enlargement. Three-vessel coronary artery atherosclerosis is present. Left chest wall pacer pack is noted with leads at the right atrium, coronary sinus and cardiac apex. Trace pericardial effusion. Suboptimal opacification of the aorta for luminal assessment. Atherosclerotic plaque within the normal caliber aorta. Normal 3 vessel branching of the aortic arch. Reflux of contrast in the hepatic veins and IVC. Mediastinum/Nodes: Diffuse edematous changes throughout the mediastinum. Scattered low-attenuation mediastinal and hilar lymph nodes favored to be reactive/edematous. No concerning axillary adenopathy. Fluid-filled, patulous thoracic esophagus. Trachea is unremarkable. No concerning thyroid abnormality though poorly evaluated with superimposed streak artifact. Lungs/Pleura: Complex, bilateral loculated pleural effusions  which are predominantly low-attenuation. There are regions of dense basilar pleural calcification in both lungs. A more diffuse pleural thickening is noted in the right lung as well. There are areas of adjacent passive atelectasis with additional more focal nodular atelectatic changes in the right lung (6/64). Could reflect some rounded atelectasis though underlying infection or disease is not fully excluded. Additional areas of bandlike reticular opacity with architectural distortion and scarring. Diffuse airways thickening and scattered secretions are noted as well. No pneumothorax. Upper Abdomen: Diffuse edematous changes noted throughout the upper abdomen and mesentery with small volume ascites. Extensive atherosclerotic plaque in the upper abdominal aorta. Calcified septation in the infrarenal abdominal aorta may reflect sequela of prior dissection or lamellar calcification of the aorta itself, incompletely assessed on this exam. Postsurgical changes from prior gastric surgery. Musculoskeletal: The osseous structures appear diffusely demineralized which  may limit detection of small or nondisplaced fractures. No acute osseous abnormality or suspicious osseous lesion. Degenerative changes are present in the imaged spine and shoulders. Diffuse body wall edema. Review of the MIP images confirms the above findings. IMPRESSION: 1. Web-like filling defects in the distal left main pulmonary artery and extending into the lingular branches may reflect subacute to chronic pulmonary embolism with acute pulmonary embolism less favored. More antidependent incomplete opacification of the left lower lobe pulmonary arteries could reflect laminar flow artifact though underlying embolism is not excluded. Evaluation for right heart strain is precluded in the setting of diffuse cardiomegaly and four-chamber cardiac enlargement though there is evidence of elevated right heart pressures and right-sided dysfunction with enlarged pulmonary arteries and refluxing contrast into the hepatic veins and IVC. Consider further characterization with echocardiography as clinically warranted. 2. Complex, bilateral loculated pleural effusions which are predominantly low-attenuation. Associated regions of dense basilar pleural calcification in both lungs. Findings can be on the spectrum of underlying asbestos related pleural disease though the sterility of these collections cannot be fully ascertained on an imaging basis. Extensive areas of passive atelectasis. More focal nodular atelectatic changes in the right lung as well. Could reflect some rounded atelectasis though underlying infection or disease is not fully excluded. Recommend attention on follow-up imaging. 3. Features of anasarca/volume overload with diffuse edematous changes throughout the mediastinum, mediastinum, upper abdomen, mesentery and body wall with small volume abdominal ascites. 4. Aortic Atherosclerosis (ICD10-I70.0). Currently attempting to contact the ordering provider with a critical value result. Addendum will be submitted  upon case discussion. Electronically Signed: By: Kreg Shropshire M.D. On: 09/29/2020 15:28   CT ABDOMEN PELVIS W CONTRAST  Result Date: 09/30/2020 CLINICAL DATA:  Unintentional weight loss EXAM: CT ABDOMEN AND PELVIS WITH CONTRAST TECHNIQUE: Multidetector CT imaging of the abdomen and pelvis was performed using the standard protocol following bolus administration of intravenous contrast. CONTRAST:  60mL OMNIPAQUE IOHEXOL 300 MG/ML  SOLN COMPARISON:  None. FINDINGS: Lower chest: Complex moderate right pleural effusion is again identified with associated pleural thickening. Small focus of extrapleural gas within this collection likely relates to recent thoracentesis. There is associated compressive atelectasis of the right lung base. Partially loculated left pleural effusion largely within the left major fissure appears stable since prior CT examination of 09/29/2020. Dense pleural calcifications at the left lung base likely relate to remote trauma or inflammation. Mild global cardiomegaly with particular enlargement of the a right ventricle and right atrium are again identified. Pacemaker leads are seen within the right heart and left ventricular venous outflow. Extensive calcifications are noted within the coronary arteries. Hepatobiliary: Reflux of contrast  into the hepatic venous system is in keeping with at least some degree of right heart failure. Tiny probable cyst within the inferior right hepatic lobe. Liver otherwise unremarkable. No intra or extrahepatic biliary ductal dilation. Gallbladder unremarkable. Pancreas: Unremarkable Spleen: Unremarkable Adrenals/Urinary Tract: The adrenal glands are unremarkable. The kidneys are normal in position. There is mild-to-moderate bilateral renal cortical atrophy noted. Bilateral simple cortical cysts are identified. The kidneys are otherwise unremarkable. The bladder is unremarkable. Stomach/Bowel: Surgical changes of a partial gastrectomy are identified. Moderate  sigmoid diverticulosis. The stomach, small bowel, and large bowel are otherwise unremarkable. No evidence of obstruction or focal inflammation. Appendix normal. No free intraperitoneal gas. Mild ascites is present. Vascular/Lymphatic: There is extensive aortoiliac atherosclerotic calcification with particularly prominent atherosclerotic calcification at the origin of the a renal and mesenteric arterial vasculature almost certainly resulting in hemodynamically significant stenoses, not well characterized on this examination. Short segment focal dissection within the infrarenal abdominal aorta, likely the result of a remote penetrating atherosclerotic ulcer results in mild ectasia of the infrarenal abdominal aorta without frank aneurysm formation, with a maximal transaxial dimension of 2.6 cm. The common iliac arteries are ectatic bilaterally, left greater than right, without frank aneurysm formation. Extensive atherosclerotic calcification is noted within the lower extremity arterial inflow and visualized outflow. Reproductive: Prostate is unremarkable. Other: There is extensive subcutaneous edema noted throughout the body wall as well as retroperitoneal edema in keeping with changes of anasarca. Musculoskeletal: Degenerative changes are seen within the lumbar spine. No suspicious lytic or blastic bone lesions are seen. Degenerative changes are noted within the hips bilaterally. No acute bone abnormality. IMPRESSION: Complex right hydropneumothorax with small gaseous component likely related to recent thoracentesis. Cardiomegaly and extensive coronary artery calcification. Large mint of the right heart and reflux of contrast into the hepatic venous system in keeping with at least some degree of right heart failure. Dense calcification at the left lung base likely result of remote trauma or inflammation. Diffuse body wall subcutaneous edema, mild ascites, and retroperitoneal edema most in keeping with moderate  anasarca, possibly the result of cardiogenic failure. Peripheral vascular disease with extensive atherosclerotic calcification at the origin of the mesenteric and renal vasculature almost certainly resulting in hemodynamically significant stenosis. Clinical correlation for signs and symptoms of chronic mesenteric ischemia and/or significant renal artery stenosis is recommended. Aortic Atherosclerosis (ICD10-I70.0). Electronically Signed   By: Helyn Numbers MD   On: 09/30/2020 13:09   DG Chest Port 1 View  Result Date: 09/30/2020 CLINICAL DATA:  Status post right-sided thoracentesis. EXAM: PORTABLE CHEST 1 VIEW COMPARISON:  CT a chest in chest x-ray from yesterday. FINDINGS: Unchanged left chest wall pacemaker. Stable cardiomegaly and pulmonary artery enlargement. Loculated bilateral pleural effusions again noted, slightly decreased on the right status post thoracentesis. No pneumothorax. Unchanged pleural calcification at the lung bases and bilateral lower lobe atelectasis. No acute osseous abnormality. IMPRESSION: 1. Loculated bilateral pleural effusions, slightly decreased on the right status post thoracentesis. No pneumothorax. Electronically Signed   By: Obie Dredge M.D.   On: 09/30/2020 12:34   ECHOCARDIOGRAM COMPLETE  Result Date: 09/30/2020    ECHOCARDIOGRAM REPORT   Patient Name:   Patrick Mccormick Date of Exam: 09/30/2020 Medical Rec #:  [id number]       Height:       67.0 in Accession #:    [id number]      Weight:       88.3 lb Date of Birth:  [birth year]  BSA:          1.429 m Patient Age:    88 years        BP:           87/50 mmHg Patient Gender: M               HR:           76 bpm. Exam Location:  [facility] Procedure: 2D Echo, Color Doppler and Cardiac Doppler Indications:     R06.00 Dyspnea  History:         Patient has no prior history of Echocardiogram examinations.                  Arrythmias:Paroxymal atrial fibrillation,                  Signs/Symptoms:Shortness of Breath; Risk  Factors:Hypertension.  Sonographer:     Humphrey Rolls RDCS (AE) Referring Phys:  [id number] AMY N COX Diagnosing Phys: Harold Hedge MD  Sonographer Comments: TDS due to bony thorax. IMPRESSIONS  1. Left ventricular ejection fraction, by estimation, is 65 to 70%. The left ventricle has normal function. The left ventricle has no regional wall motion abnormalities. Left ventricular diastolic parameters were normal.  2. Right ventricular systolic function is normal. The right ventricular size is mildly enlarged.  3. Left atrial size was mildly dilated.  4. Right atrial size was mildly dilated.  5. The mitral valve is grossly normal. Mild to moderate mitral valve regurgitation.  6. The aortic valve is calcified. Aortic valve regurgitation is trivial. Mild to moderate aortic valve sclerosis/calcification is present, without any evidence of aortic stenosis. FINDINGS  Left Ventricle: Left ventricular ejection fraction, by estimation, is 65 to 70%. The left ventricle has normal function. The left ventricle has no regional wall motion abnormalities. The left ventricular internal cavity size was normal in size. There is  borderline left ventricular hypertrophy. Left ventricular diastolic parameters were normal. Right Ventricle: The right ventricular size is mildly enlarged. No increase in right ventricular wall thickness. Right ventricular systolic function is normal. Left Atrium: Left atrial size was mildly dilated. Right Atrium: Right atrial size was mildly dilated. Pericardium: There is no evidence of pericardial effusion. Mitral Valve: The mitral valve is grossly normal. Mild to moderate mitral valve regurgitation. MV peak gradient, 3.1 mmHg. The mean mitral valve gradient is 1.0 mmHg. Tricuspid Valve: The tricuspid valve is not well visualized. Tricuspid valve regurgitation is trivial. Aortic Valve: The aortic valve is calcified. Aortic valve regurgitation is trivial. Mild to moderate aortic valve sclerosis/calcification is  present, without any evidence of aortic stenosis. Aortic valve mean gradient measures 3.0 mmHg. Aortic valve peak  gradient measures 5.9 mmHg. Aortic valve area, by VTI measures 1.71 cm. Pulmonic Valve: The pulmonic valve was not well visualized. Pulmonic valve regurgitation is trivial. Aorta: The aortic root is normal in size and structure. IAS/Shunts: No atrial level shunt detected by color flow Doppler. Additional Comments: A pacer wire is visualized.  LEFT VENTRICLE PLAX 2D LVIDd:         3.50 cm LVIDs:         2.10 cm LV PW:         1.10 cm LV IVS:        0.80 cm LVOT diam:     2.20 cm LV SV:         38 LV SV Index:   26 LVOT Area:     3.80 cm  RIGHT VENTRICLE  RV Basal diam:  4.80 cm LEFT ATRIUM             Index       RIGHT ATRIUM           Index LA diam:        4.10 cm 2.87 cm/m  RA Area:     30.00 cm LA Vol (A2C):   40.4 ml 28.28 ml/m RA Volume:   112.00 ml 78.39 ml/m LA Vol (A4C):   92.3 ml 64.61 ml/m LA Biplane Vol: 65.3 ml 45.71 ml/m  AORTIC VALVE                   PULMONIC VALVE AV Area (Vmax):    1.97 cm    PV Vmax:       0.68 m/s AV Area (Vmean):   1.92 cm    PV Vmean:      42.900 cm/s AV Area (VTI):     1.71 cm    PV VTI:        0.128 m AV Vmax:           121.00 cm/s PV Peak grad:  1.9 mmHg AV Vmean:          79.000 cm/s PV Mean grad:  1.0 mmHg AV VTI:            0.220 m AV Peak Grad:      5.9 mmHg AV Mean Grad:      3.0 mmHg LVOT Vmax:         62.70 cm/s LVOT Vmean:        40.000 cm/s LVOT VTI:          0.099 m LVOT/AV VTI ratio: 0.45  AORTA Ao Root diam: 3.50 cm MITRAL VALVE MV Area (PHT): 6.96 cm    SHUNTS MV Area VTI:   2.36 cm    Systemic VTI:  0.10 m MV Peak grad:  3.1 mmHg    Systemic Diam: 2.20 cm MV Mean grad:  1.0 mmHg MV Vmax:       0.88 m/s MV Vmean:      47.8 cm/s MV Decel Time: 109 msec MV E velocity: 75.80 cm/s MV A velocity: 36.80 cm/s MV E/A ratio:  2.06 Harold Hedge MD Electronically signed by Harold Hedge MD Signature Date/Time: 09/30/2020/11:36:34 AM    Final    US  THORACENTESIS ASP PLEURAL SPACE W/IMG GUIDE  Result Date: 09/30/2020 INDICATION: Patient with history of pulmonary hypertension and persistent AFib. Found to have bilateral pleural effusions after presenting to the emergency department with shortness of breath. Team is requesting therapeutic and diagnostic thoracentesis EXAM: ULTRASOUND GUIDED RIGHT-SIDED THERAPEUTIC AND DIAGNOSTIC THORACENTESIS MEDICATIONS: Lidocaine 1% 10 mL COMPLICATIONS: None immediate. PROCEDURE: An ultrasound guided thoracentesis was thoroughly discussed with the patient and questions answered. The benefits, risks, alternatives and complications were also discussed. The patient understands and wishes to proceed with the procedure. Written consent was obtained. Ultrasound was performed to localize and mark an adequate pocket of fluid in the right chest. The area was then prepped and draped in the normal sterile fashion. 1% Lidocaine was used for local anesthesia. Under ultrasound guidance a 6 Fr Safe-T-Centesis catheter was introduced. Thoracentesis was performed. The catheter was removed and a dressing applied. FINDINGS: A total of approximately 350 mL of amber colored fluid was removed. Samples were sent to the laboratory as requested by the clinical team. Patient unable to tolerate additional fluid removal and per patient request the procedure was terminated.  IMPRESSION: Successful ultrasound guided therapeutic and diagnostic right-sided thoracentesis yielding 350 mL of pleural fluid. Read by: Anders Grant, NP Electronically Signed   By: Simonne Come M.D.   On: 09/30/2020 12:54    Scheduled Meds: . allopurinol  100 mg Oral Daily  . apixaban  10 mg Oral BID   Followed by  . [START ON 10/07/2020] apixaban  5 mg Oral BID  . vitamin C  250 mg Oral BID  . atenolol  50 mg Oral Daily  . atorvastatin  20 mg Oral Daily  . feeding supplement  237 mL Oral TID BM  . furosemide  40 mg Intravenous Once  . hydrocortisone sod succinate  (SOLU-CORTEF) inj  50 mg Intravenous Q6H  . midodrine  10 mg Oral TID WC  . mirtazapine  7.5 mg Oral QHS  . multivitamin with minerals  1 tablet Oral Daily  . sildenafil  20 mg Oral TID   Continuous Infusions:    LOS: 1 day   Time spent: 25 minutes.  [name], MD [facility]  If 7PM-7AM, please contact night-coverage [email or web address]  10/01/2020, 2:47 PM   This record has been created using Conservation officer, historic buildings. Errors have been sought and corrected,but may not always be located. Such creation errors do not reflect on the standard of care.

## 2020-10-01 NOTE — Progress Notes (Signed)
OT Cancellation Note  Patient Details Name: Patrick Mccormick MRN: [id number] DOB: November 23, 1931   Cancelled Treatment:    Reason Eval/Treat Not Completed: Medical issues which prohibited therapy  Pt still within 48 hour window of anticoagulation therapy for PE. Will hold OT at this time and f/u for evaluation at later date/time as appropriate. Thank you.  Rejeana Brock, MS, OTR/L ascom [phone] 10/01/20, 3:48 PM

## 2020-10-01 NOTE — Progress Notes (Signed)
PT Cancellation Note  Patient Details Name: Patrick Mccormick MRN: [id number] DOB: June 30, 1932   Cancelled Treatment:    Reason Eval/Treat Not Completed: Medical issues which prohibited therapy. PT orders received and pt chart reviewed. Pt on anticoagulant therapy for PE that started 09/29/20 @ 1647. Per MD note, pt continues to have c/o mild SOB. Per hospital protocol, PT to hold therapeutic intervention until pt has completed 48hrs of therapeutic anticoagulation and be free of significant clinical signs/symptoms that would limit performance prior to initiation of exertional activity. Will follow up with therapeutic intervention tomorrow as appropriate.  Vira Blanco, PT, DPT 8:15 AM,10/01/20

## 2020-10-01 NOTE — Consult Note (Signed)
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  Admits to wt loss>40lbs.  He further endorses new watery diarrhea, three times per day for one week. He denies blood. He endorses history of persistent right pleural effusion status post thoracentesis in which he had about two liters removed from only his right lungs about two years ago.  : He is married and lives with his spouse of 50 years. He is retired and formerly worked as a Radiographer, therapeuticfacility and safety manager. He graduated from [employer] with a Public librarianchemical engineering degree. He has never been a tobacco user. He drinks about two glasses of wine per night. PCCM consultation for pulmnary hypertension.     Patient had thoracentesis donex2 in 2020 and "was trying to control that",  He had one thoracentesis after [facility] and was able to get 300cc thoracentesis done but asked for procedure to be stopped half way throughout.    [name] is Patrick Skinnerhoebe is present today. She shares he is a chronic drinker of alcohol 1 day prior to admission he drank 3 glasses of wine.  He had TTE which was essentially normal.     PAST MEDICAL HISTORY   History reviewed. No pertinent past medical history.   SURGICAL HISTORY   History reviewed. No pertinent surgical history.   FAMILY HISTORY   History reviewed. No pertinent family history.   SOCIAL HISTORY   Social History   Tobacco Use  . Smoking  status: Never Smoker  . Smokeless tobacco: Never Used     MEDICATIONS    Home Medication:    Current Medication:  Current Facility-Administered Medications:  .  acetaminophen (TYLENOL) tablet 325 mg, 325 mg, Oral, Q6H PRN **OR** acetaminophen (TYLENOL) suppository 325 mg, 325 mg, Rectal, Q6H PRN, Cox, Amy N, DO .  allopurinol (ZYLOPRIM) tablet 100 mg, 100 mg, Oral, Daily, Cox, Amy N, DO, 100 mg at 10/01/20 1043 .  apixaban (ELIQUIS) tablet 10 mg, 10 mg, Oral, BID, 10 mg at 10/01/20 1042 **FOLLOWED BY** [START ON 10/07/2020] apixaban (ELIQUIS) tablet 5 mg, 5 mg, Oral, BID, [name], [name], MD .  ascorbic acid (VITAMIN C) tablet 250 mg, 250 mg, Oral, BID, Arnetha CourserAmin, [name], MD, 250 mg at 10/01/20 1043 .  atenolol (TENORMIN) tablet 50 mg, 50 mg, Oral, Daily, Cox, Amy N, DO, 50 mg at 10/01/20 1044 .  atorvastatin (LIPITOR) tablet 20 mg, 20 mg, Oral, Daily, Cox, Amy N, DO, 20 mg at 10/01/20 1044 .  feeding supplement (ENSURE ENLIVE / ENSURE PLUS) liquid 237 mL, 237 mL, Oral, TID BM, [name], [name], MD, 237 mL at 10/01/20 1045 .  furosemide (LASIX) injection 40 mg, 40 mg, Intravenous, Once, [name], Tilman NeatSumayya, MD .  metoprolol tartrate (LOPRESSOR) injection 5 mg, 5 mg, Intravenous, Q4H PRN, Cox, Amy N, DO .  mirtazapine (REMERON) tablet 7.5 mg, 7.5 mg, Oral, QHS, Cox, Amy N, DO,  7.5 mg at 09/30/20 2121 .  multivitamin with minerals tablet 1 tablet, 1 tablet, Oral, Daily, [name], MD, 1 tablet at 10/01/20 1044 .  ondansetron (ZOFRAN) tablet 4 mg, 4 mg, Oral, Q6H PRN **OR** ondansetron (ZOFRAN) injection 4 mg, 4 mg, Intravenous, Q6H PRN, Cox, Amy N, DO .  sildenafil (REVATIO) tablet 20 mg, 20 mg, Oral, TID, Cox, Amy N, DO, 20 mg at 10/01/20 1044 .  sodium chloride (OCEAN) 0.65 % nasal spray 1 spray, 1 spray, Each Nare, PRN, [name], MD    ALLERGIES   Patient has no known allergies.     REVIEW OF SYSTEMS    Review of Systems:  Gen:  Denies  fever, sweats, chills weigh loss  HEENT:  Denies blurred vision, double vision, ear pain, eye pain, hearing loss, nose bleeds, sore throat Cardiac:  No dizziness, chest pain or heaviness, chest tightness,edema Resp:   Denies cough or sputum porduction, shortness of breath,wheezing, hemoptysis,  Gi: Denies swallowing difficulty, stomach pain, nausea or vomiting, diarrhea, constipation, bowel incontinence Gu:  Denies bladder incontinence, burning urine Ext:   Denies Joint pain, stiffness or swelling Skin: Denies  skin rash, easy bruising or bleeding or hives Endoc:  Denies polyuria, polydipsia , polyphagia or weight change Psych:   Denies depression, insomnia or hallucinations   Other:  All other systems negative   VS: BP 112/76 (BP Location: Left Arm)   Pulse 80   Temp 98.4 F (36.9 C) (Oral)   Resp 16   Ht 5\' 7"  (1.702 m)   Wt 38.8 kg   SpO2 95%   BMI 13.39 kg/m      PHYSICAL EXAM    GENERAL:NAD, no fevers, chills, no weakness no fatigue HEAD: Normocephalic, atraumatic.  EYES: Pupils equal, round, reactive to light. Extraocular muscles intact. No scleral icterus.  MOUTH: Moist mucosal membrane. Dentition intact. No abscess noted.  EAR, NOSE, THROAT: Clear without exudates. No external lesions.  NECK: Supple. No thyromegaly. No nodules. No JVD.  PULMONARY: Diffuse coarse rhonchi right sided +wheezes CARDIOVASCULAR: S1 and S2. Regular rate and rhythm. No murmurs, rubs, or gallops. No edema. Pedal pulses 2+ bilaterally.  GASTROINTESTINAL: Soft, nontender, nondistended. No masses. Positive bowel sounds. No hepatosplenomegaly.  MUSCULOSKELETAL: No swelling, clubbing, or edema. Range of motion full in all extremities.  NEUROLOGIC: Cranial nerves II through XII are intact. No gross focal neurological deficits. Sensation intact. Reflexes intact.  SKIN: No ulceration, lesions, rashes, or cyanosis. Skin warm and dry. Turgor intact.  PSYCHIATRIC: Mood, affect within normal limits. The patient is awake, alert and oriented x 3.  Insight, judgment intact.       IMAGING    CT Angio Chest PE W and/or Wo Contrast  Addendum Date: 09/29/2020   ADDENDUM REPORT: 09/29/2020 15:35 ADDENDUM: These results were called by telephone at the time of interpretation on 09/29/2020 at 3:35 pm to provider AMY COX , who verbally acknowledged these results. Electronically Signed   By: 11/27/2020 M.D.   On: 09/29/2020 15:35   Result Date: 09/29/2020 CLINICAL DATA:  Dyspnea EXAM: CT ANGIOGRAPHY CHEST WITH CONTRAST TECHNIQUE: Multidetector CT imaging of the chest was performed using the standard protocol during bolus administration of intravenous contrast. Multiplanar CT image reconstructions and MIPs were obtained to evaluate the vascular anatomy. CONTRAST:  42mL OMNIPAQUE IOHEXOL 350 MG/ML SOLN COMPARISON:  Radiograph 09/29/2020 FINDINGS: Cardiovascular: Satisfactory opacification of pulmonary arteries. Some thin linear web-like bands of hypoattenuation in the distal left main pulmonary artery and lingular branches. Some  more anti dependent hypoattenuation within the segmental pulmonary arteries of the left lower lobe have an appearance favored to be laminar flow and mixing artifact though a more subacute to remote pulmonary embolism is not fully excluded. Central pulmonary arteries are enlarged. There is marked cardiomegaly with four-chamber enlargement. Three-vessel coronary artery atherosclerosis is present. Left chest wall pacer pack is noted with leads at the right atrium, coronary sinus and cardiac apex. Trace pericardial effusion. Suboptimal opacification of the aorta for luminal assessment. Atherosclerotic plaque within the normal caliber aorta. Normal 3 vessel branching of the aortic arch. Reflux of contrast in the hepatic veins and IVC. Mediastinum/Nodes: Diffuse edematous changes throughout the mediastinum. Scattered low-attenuation mediastinal and hilar lymph nodes favored to be reactive/edematous. No concerning axillary adenopathy.  Fluid-filled, patulous thoracic esophagus. Trachea is unremarkable. No concerning thyroid abnormality though poorly evaluated with superimposed streak artifact. Lungs/Pleura: Complex, bilateral loculated pleural effusions which are predominantly low-attenuation. There are regions of dense basilar pleural calcification in both lungs. A more diffuse pleural thickening is noted in the right lung as well. There are areas of adjacent passive atelectasis with additional more focal nodular atelectatic changes in the right lung (6/64). Could reflect some rounded atelectasis though underlying infection or disease is not fully excluded. Additional areas of bandlike reticular opacity with architectural distortion and scarring. Diffuse airways thickening and scattered secretions are noted as well. No pneumothorax. Upper Abdomen: Diffuse edematous changes noted throughout the upper abdomen and mesentery with small volume ascites. Extensive atherosclerotic plaque in the upper abdominal aorta. Calcified septation in the infrarenal abdominal aorta may reflect sequela of prior dissection or lamellar calcification of the aorta itself, incompletely assessed on this exam. Postsurgical changes from prior gastric surgery. Musculoskeletal: The osseous structures appear diffusely demineralized which may limit detection of small or nondisplaced fractures. No acute osseous abnormality or suspicious osseous lesion. Degenerative changes are present in the imaged spine and shoulders. Diffuse body wall edema. Review of the MIP images confirms the above findings. IMPRESSION: 1. Web-like filling defects in the distal left main pulmonary artery and extending into the lingular branches may reflect subacute to chronic pulmonary embolism with acute pulmonary embolism less favored. More antidependent incomplete opacification of the left lower lobe pulmonary arteries could reflect laminar flow artifact though underlying embolism is not excluded.  Evaluation for right heart strain is precluded in the setting of diffuse cardiomegaly and four-chamber cardiac enlargement though there is evidence of elevated right heart pressures and right-sided dysfunction with enlarged pulmonary arteries and refluxing contrast into the hepatic veins and IVC. Consider further characterization with echocardiography as clinically warranted. 2. Complex, bilateral loculated pleural effusions which are predominantly low-attenuation. Associated regions of dense basilar pleural calcification in both lungs. Findings can be on the spectrum of underlying asbestos related pleural disease though the sterility of these collections cannot be fully ascertained on an imaging basis. Extensive areas of passive atelectasis. More focal nodular atelectatic changes in the right lung as well. Could reflect some rounded atelectasis though underlying infection or disease is not fully excluded. Recommend attention on follow-up imaging. 3. Features of anasarca/volume overload with diffuse edematous changes throughout the mediastinum, mediastinum, upper abdomen, mesentery and body wall with small volume abdominal ascites. 4. Aortic Atherosclerosis (ICD10-I70.0). Currently attempting to contact the ordering provider with a critical value result. Addendum will be submitted upon case discussion. Electronically Signed: By: Kreg Shropshire M.D. On: 09/29/2020 15:28   CT ABDOMEN PELVIS W CONTRAST  Result Date: 09/30/2020 CLINICAL DATA:  Unintentional weight loss  EXAM: CT ABDOMEN AND PELVIS WITH CONTRAST TECHNIQUE: Multidetector CT imaging of the abdomen and pelvis was performed using the standard protocol following bolus administration of intravenous contrast. CONTRAST:  60mL OMNIPAQUE IOHEXOL 300 MG/ML  SOLN COMPARISON:  None. FINDINGS: Lower chest: Complex moderate right pleural effusion is again identified with associated pleural thickening. Small focus of extrapleural gas within this collection likely  relates to recent thoracentesis. There is associated compressive atelectasis of the right lung base. Partially loculated left pleural effusion largely within the left major fissure appears stable since prior CT examination of 09/29/2020. Dense pleural calcifications at the left lung base likely relate to remote trauma or inflammation. Mild global cardiomegaly with particular enlargement of the a right ventricle and right atrium are again identified. Pacemaker leads are seen within the right heart and left ventricular venous outflow. Extensive calcifications are noted within the coronary arteries. Hepatobiliary: Reflux of contrast into the hepatic venous system is in keeping with at least some degree of right heart failure. Tiny probable cyst within the inferior right hepatic lobe. Liver otherwise unremarkable. No intra or extrahepatic biliary ductal dilation. Gallbladder unremarkable. Pancreas: Unremarkable Spleen: Unremarkable Adrenals/Urinary Tract: The adrenal glands are unremarkable. The kidneys are normal in position. There is mild-to-moderate bilateral renal cortical atrophy noted. Bilateral simple cortical cysts are identified. The kidneys are otherwise unremarkable. The bladder is unremarkable. Stomach/Bowel: Surgical changes of a partial gastrectomy are identified. Moderate sigmoid diverticulosis. The stomach, small bowel, and large bowel are otherwise unremarkable. No evidence of obstruction or focal inflammation. Appendix normal. No free intraperitoneal gas. Mild ascites is present. Vascular/Lymphatic: There is extensive aortoiliac atherosclerotic calcification with particularly prominent atherosclerotic calcification at the origin of the a renal and mesenteric arterial vasculature almost certainly resulting in hemodynamically significant stenoses, not well characterized on this examination. Short segment focal dissection within the infrarenal abdominal aorta, likely the result of a remote penetrating  atherosclerotic ulcer results in mild ectasia of the infrarenal abdominal aorta without frank aneurysm formation, with a maximal transaxial dimension of 2.6 cm. The common iliac arteries are ectatic bilaterally, left greater than right, without frank aneurysm formation. Extensive atherosclerotic calcification is noted within the lower extremity arterial inflow and visualized outflow. Reproductive: Prostate is unremarkable. Other: There is extensive subcutaneous edema noted throughout the body wall as well as retroperitoneal edema in keeping with changes of anasarca. Musculoskeletal: Degenerative changes are seen within the lumbar spine. No suspicious lytic or blastic bone lesions are seen. Degenerative changes are noted within the hips bilaterally. No acute bone abnormality. IMPRESSION: Complex right hydropneumothorax with small gaseous component likely related to recent thoracentesis. Cardiomegaly and extensive coronary artery calcification. Large mint of the right heart and reflux of contrast into the hepatic venous system in keeping with at least some degree of right heart failure. Dense calcification at the left lung base likely result of remote trauma or inflammation. Diffuse body wall subcutaneous edema, mild ascites, and retroperitoneal edema most in keeping with moderate anasarca, possibly the result of cardiogenic failure. Peripheral vascular disease with extensive atherosclerotic calcification at the origin of the mesenteric and renal vasculature almost certainly resulting in hemodynamically significant stenosis. Clinical correlation for signs and symptoms of chronic mesenteric ischemia and/or significant renal artery stenosis is recommended. Aortic Atherosclerosis (ICD10-I70.0). Electronically Signed   By: Helyn Numbers MD   On: 09/30/2020 13:09   DG Chest Port 1 View  Result Date: 09/30/2020 CLINICAL DATA:  Status post right-sided thoracentesis. EXAM: PORTABLE CHEST 1 VIEW COMPARISON:  CT a chest in  chest x-ray from yesterday. FINDINGS: Unchanged left chest wall pacemaker. Stable cardiomegaly and pulmonary artery enlargement. Loculated bilateral pleural effusions again noted, slightly decreased on the right status post thoracentesis. No pneumothorax. Unchanged pleural calcification at the lung bases and bilateral lower lobe atelectasis. No acute osseous abnormality. IMPRESSION: 1. Loculated bilateral pleural effusions, slightly decreased on the right status post thoracentesis. No pneumothorax. Electronically Signed   By: Obie Dredge M.D.   On: 09/30/2020 12:34   DG Chest Portable 1 View  Result Date: 09/29/2020 CLINICAL DATA:  Dyspnea EXAM: PORTABLE CHEST 1 VIEW COMPARISON:  None. FINDINGS: Bilateral basilar laterally loculated pleural effusions are present, moderate on the right and small on the left. Left basilar pleural calcifications are identified. There is enlargement of the right hilum suggesting presence of right hilar mass or right hilar adenopathy. There is partial right lower lobe collapse. Opacity within the left perihilar region may represent fluid within the left major fissure. A left perihilar mass, however, is difficult to exclude. No pneumothorax. Mild cardiomegaly. Left subclavian 3 lead pacemaker in place. Vascular calcifications are seen within the abdominal aorta. The pulmonary vascularity is normal. No acute bone abnormality. IMPRESSION: Suspected right hilar mass or adenopathy with subtotal collapse of the right lower lobe possibly representing postobstructive collapse. Bilateral loculated pleural effusions, moderate on the right and small on the left. Coarse left basilar pleural calcification may relate to remote trauma or infection. Left perihilar opacity possibly representing fluid within the fissure or a left perihilar focal pulmonary mass. This would be better assessed with dedicated contrast enhanced CT imaging. Mild cardiomegaly. Electronically Signed   By: Helyn Numbers  MD   On: 09/29/2020 11:08   ECHOCARDIOGRAM COMPLETE  Result Date: 09/30/2020    ECHOCARDIOGRAM REPORT   Patient Name:   Patrick Mccormick Date of Exam: 09/30/2020 Medical Rec #:  [id number]       Height:       67.0 in Accession #:    [id number]      Weight:       88.3 lb Date of Birth:  1932-04-23       BSA:          1.429 m Patient Age:    88 years        BP:           87/50 mmHg Patient Gender: M               HR:           76 bpm. Exam Location:  [facility] Procedure: 2D Echo, Color Doppler and Cardiac Doppler Indications:     R06.00 Dyspnea  History:         Patient has no prior history of Echocardiogram examinations.                  Arrythmias:Paroxymal atrial fibrillation,                  Signs/Symptoms:Shortness of Breath; Risk Factors:Hypertension.  Sonographer:     Humphrey Rolls RDCS (AE) Referring Phys:  [id number] AMY N COX Diagnosing Phys: Harold Hedge MD  Sonographer Comments: TDS due to bony thorax. IMPRESSIONS  1. Left ventricular ejection fraction, by estimation, is 65 to 70%. The left ventricle has normal function. The left ventricle has no regional wall motion abnormalities. Left ventricular diastolic parameters were normal.  2. Right ventricular systolic function is normal. The right ventricular size is mildly enlarged.  3. Left atrial size was mildly dilated.  4. Right atrial size was mildly dilated.  5. The mitral valve is grossly normal. Mild to moderate mitral valve regurgitation.  6. The aortic valve is calcified. Aortic valve regurgitation is trivial. Mild to moderate aortic valve sclerosis/calcification is present, without any evidence of aortic stenosis. FINDINGS  Left Ventricle: Left ventricular ejection fraction, by estimation, is 65 to 70%. The left ventricle has normal function. The left ventricle has no regional wall motion abnormalities. The left ventricular internal cavity size was normal in size. There is  borderline left ventricular hypertrophy. Left ventricular diastolic parameters were  normal. Right Ventricle: The right ventricular size is mildly enlarged. No increase in right ventricular wall thickness. Right ventricular systolic function is normal. Left Atrium: Left atrial size was mildly dilated. Right Atrium: Right atrial size was mildly dilated. Pericardium: There is no evidence of pericardial effusion. Mitral Valve: The mitral valve is grossly normal. Mild to moderate mitral valve regurgitation. MV peak gradient, 3.1 mmHg. The mean mitral valve gradient is 1.0 mmHg. Tricuspid Valve: The tricuspid valve is not well visualized. Tricuspid valve regurgitation is trivial. Aortic Valve: The aortic valve is calcified. Aortic valve regurgitation is trivial. Mild to moderate aortic valve sclerosis/calcification is present, without any evidence of aortic stenosis. Aortic valve mean gradient measures 3.0 mmHg. Aortic valve peak  gradient measures 5.9 mmHg. Aortic valve area, by VTI measures 1.71 cm. Pulmonic Valve: The pulmonic valve was not well visualized. Pulmonic valve regurgitation is trivial. Aorta: The aortic root is normal in size and structure. IAS/Shunts: No atrial level shunt detected by color flow Doppler. Additional Comments: A pacer wire is visualized.  LEFT VENTRICLE PLAX 2D LVIDd:         3.50 cm LVIDs:         2.10 cm LV PW:         1.10 cm LV IVS:        0.80 cm LVOT diam:     2.20 cm LV SV:         38 LV SV Index:   26 LVOT Area:     3.80 cm  RIGHT VENTRICLE RV Basal diam:  4.80 cm LEFT ATRIUM             Index       RIGHT ATRIUM           Index LA diam:        4.10 cm 2.87 cm/m  RA Area:     30.00 cm LA Vol (A2C):   40.4 ml 28.28 ml/m RA Volume:   112.00 ml 78.39 ml/m LA Vol (A4C):   92.3 ml 64.61 ml/m LA Biplane Vol: 65.3 ml 45.71 ml/m  AORTIC VALVE                   PULMONIC VALVE AV Area (Vmax):    1.97 cm    PV Vmax:       0.68 m/s AV Area (Vmean):   1.92 cm    PV Vmean:      42.900 cm/s AV Area (VTI):     1.71 cm    PV VTI:        0.128 m AV Vmax:           121.00  cm/s PV Peak grad:  1.9 mmHg AV Vmean:          79.000 cm/s PV Mean grad:  1.0 mmHg AV VTI:            0.220 m AV  Peak Grad:      5.9 mmHg AV Mean Grad:      3.0 mmHg LVOT Vmax:         62.70 cm/s LVOT Vmean:        40.000 cm/s LVOT VTI:          0.099 m LVOT/AV VTI ratio: 0.45  AORTA Ao Root diam: 3.50 cm MITRAL VALVE MV Area (PHT): 6.96 cm    SHUNTS MV Area VTI:   2.36 cm    Systemic VTI:  0.10 m MV Peak grad:  3.1 mmHg    Systemic Diam: 2.20 cm MV Mean grad:  1.0 mmHg MV Vmax:       0.88 m/s MV Vmean:      47.8 cm/s MV Decel Time: 109 msec MV E velocity: 75.80 cm/s MV A velocity: 36.80 cm/s MV E/A ratio:  2.06 Harold Hedge MD Electronically signed by Harold Hedge MD Signature Date/Time: 09/30/2020/11:36:34 AM    Final    US THORACENTESIS ASP PLEURAL SPACE W/IMG GUIDE  Result Date: 09/30/2020 INDICATION: Patient with history of pulmonary hypertension and persistent AFib. Found to have bilateral pleural effusions after presenting to the emergency department with shortness of breath. Team is requesting therapeutic and diagnostic thoracentesis EXAM: ULTRASOUND GUIDED RIGHT-SIDED THERAPEUTIC AND DIAGNOSTIC THORACENTESIS MEDICATIONS: Lidocaine 1% 10 mL COMPLICATIONS: None immediate. PROCEDURE: An ultrasound guided thoracentesis was thoroughly discussed with the patient and questions answered. The benefits, risks, alternatives and complications were also discussed. The patient understands and wishes to proceed with the procedure. Written consent was obtained. Ultrasound was performed to localize and mark an adequate pocket of fluid in the right chest. The area was then prepped and draped in the normal sterile fashion. 1% Lidocaine was used for local anesthesia. Under ultrasound guidance a 6 Fr Safe-T-Centesis catheter was introduced. Thoracentesis was performed. The catheter was removed and a dressing applied. FINDINGS: A total of approximately 350 mL of amber colored fluid was removed. Samples were sent to the  laboratory as requested by the clinical team. Patient unable to tolerate additional fluid removal and per patient request the procedure was terminated. IMPRESSION: Successful ultrasound guided therapeutic and diagnostic right-sided thoracentesis yielding 350 mL of pleural fluid. Read by: Anders Grant, NP Electronically Signed   By: Simonne Come M.D.   On: 09/30/2020 12:54    ECHOCARDIOGRAM REPORT       Patient Name:  KARMELO BASS Date of Exam: 09/30/2020  Medical Rec #: [id number]    Height:    67.0 in  Accession #:  [id number]   Weight:    88.3 lb  Date of Birth: 1931-09-29    BSA:     1.429 m  Patient Age:  88 years    BP:      87/50 mmHg  Patient Gender: M        HR:      76 bpm.  Exam Location: [facility]   Procedure: 2D Echo, Color Doppler and Cardiac Doppler   Indications:   R06.00 Dyspnea    History:     Patient has no prior history of Echocardiogram  examinations.          Arrythmias:Paroxymal atrial fibrillation,          Signs/Symptoms:Shortness of Breath; Risk  Factors:Hypertension.    Sonographer:   Humphrey Rolls RDCS (AE)  Referring Phys: [id number] AMY N COX  Diagnosing Phys: Harold Hedge MD     Sonographer Comments: TDS due to bony thorax.  IMPRESSIONS  1. Left ventricular ejection fraction, by estimation, is 65 to 70%. The  left ventricle has normal function. The left ventricle has no regional  wall motion abnormalities. Left ventricular diastolic parameters were  normal.  2. Right ventricular systolic function is normal. The right ventricular  size is mildly enlarged.  3. Left atrial size was mildly dilated.  4. Right atrial size was mildly dilated.  5. The mitral valve is grossly normal. Mild to moderate mitral valve  regurgitation.  6. The aortic valve is calcified. Aortic valve regurgitation is trivial.  Mild to moderate aortic valve sclerosis/calcification is present,  without  any evidence of aortic stenosis.   FINDINGS  Left Ventricle: Left ventricular ejection fraction, by estimation, is 65  to 70%. The left ventricle has normal function. The left ventricle has no  regional wall motion abnormalities. The left ventricular internal cavity  size was normal in size. There is  borderline left ventricular hypertrophy. Left ventricular diastolic  parameters were normal.   Right Ventricle: The right ventricular size is mildly enlarged. No  increase in right ventricular wall thickness. Right ventricular systolic  function is normal.   Left Atrium: Left atrial size was mildly dilated.   Right Atrium: Right atrial size was mildly dilated.   Pericardium: There is no evidence of pericardial effusion.   Mitral Valve: The mitral valve is grossly normal. Mild to moderate mitral  valve regurgitation. MV peak gradient, 3.1 mmHg. The mean mitral valve  gradient is 1.0 mmHg.   Tricuspid Valve: The tricuspid valve is not well visualized. Tricuspid  valve regurgitation is trivial.   Aortic Valve: The aortic valve is calcified. Aortic valve regurgitation is  trivial. Mild to moderate aortic valve sclerosis/calcification is present,  without any evidence of aortic stenosis. Aortic valve mean gradient  measures 3.0 mmHg. Aortic valve peak  gradient measures 5.9 mmHg. Aortic valve area, by VTI measures 1.71 cm.   Pulmonic Valve: The pulmonic valve was not well visualized. Pulmonic valve  regurgitation is trivial.   Aorta: The aortic root is normal in size and structure.   IAS/Shunts: No atrial level shunt detected by color flow Doppler.   Additional Comments: A pacer wire is visualized.     LEFT VENTRICLE  PLAX 2D  LVIDd:     3.50 cm  LVIDs:     2.10 cm  LV PW:     1.10 cm  LV IVS:    0.80 cm  LVOT diam:   2.20 cm  LV SV:     38  LV SV Index:  26  LVOT Area:   3.80 cm     RIGHT VENTRICLE  RV Basal diam: 4.80 cm    LEFT ATRIUM       Index    RIGHT ATRIUM      Index  LA diam:    4.10 cm 2.87 cm/m RA Area:   30.00 cm  LA Vol (A2C):  40.4 ml 28.28 ml/m RA Volume:  112.00 ml 78.39 ml/m  LA Vol (A4C):  92.3 ml 64.61 ml/m  LA Biplane Vol: 65.3 ml 45.71 ml/m  AORTIC VALVE          PULMONIC VALVE  AV Area (Vmax):  1.97 cm  PV Vmax:    0.68 m/s  AV Area (Vmean):  1.92 cm  PV Vmean:   42.900 cm/s  AV Area (VTI):   1.71 cm  PV VTI:    0.128 m  AV Vmax:      121.00 cm/s  PV Peak grad: 1.9 mmHg  AV Vmean:     79.000 cm/s PV Mean grad: 1.0 mmHg  AV VTI:      0.220 m  AV Peak Grad:   5.9 mmHg  AV Mean Grad:   3.0 mmHg  LVOT Vmax:     62.70 cm/s  LVOT Vmean:    40.000 cm/s  LVOT VTI:     0.099 m  LVOT/AV VTI ratio: 0.45    AORTA  Ao Root diam: 3.50 cm   MITRAL VALVE  MV Area (PHT): 6.96 cm  SHUNTS  MV Area VTI:  2.36 cm  Systemic VTI: 0.10 m  MV Peak grad: 3.1 mmHg  Systemic Diam: 2.20 cm  MV Mean grad: 1.0 mmHg  MV Vmax:    0.88 m/s  MV Vmean:   47.8 cm/s  MV Decel Time: 109 msec  MV E velocity: 75.80 cm/s  MV A velocity: 36.80 cm/s  MV E/A ratio: 2.06   Harold Hedge MD  Electronically signed by Harold Hedge MD  Signature Date/Time: 09/30/2020/11:36:34 AM       ASSESSMENT/PLAN    Chronic pulmonary effusions -Patient is not in heart failure per most recent TTE -will obtain US liver due to history of EtOH use  -he has mild aki today buy in general his GFR is within reference range when corrected for age.  -will place patient on midordrine and lasix today and hold revatio for now -Na is elevated likely due to liver dz with third spaced fluid in pleural space -thus far pleural fluid showing borderline exudate lymphocyte predominant which likely reflects chronic effusion.  The remainder of fluid studies including cytology is in process.  Pulmonary hypertension   - will  hold sildenafil for now and treat low Na and pleural effusion for now   Bibasilar atelectasis  - patient does not use IS or flutter due to some confusion with hyponatremia   - will order metaNEB for recruitnement     Thank you for allowing me to participate in the care of this patient.    Patient/Family are satisfied with care plan and all questions have been answered.  This document was prepared using Dragon voice recognition software and may include unintentional dictation errors.     Vida Rigger, M.D.  Division of Pulmonary & Critical Care Medicine  [facility]

## 2020-10-02 ENCOUNTER — Inpatient Hospital Stay: Payer: Medicare HMO

## 2020-10-02 DIAGNOSIS — R0603 Acute respiratory distress: Secondary | ICD-10-CM | POA: Diagnosis not present

## 2020-10-02 LAB — HEPATIC FUNCTION PANEL
ALT: 16 U/L (ref 0–44)
AST: 34 U/L (ref 15–41)
Albumin: 3.2 g/dL — ABNORMAL LOW (ref 3.5–5.0)
Alkaline Phosphatase: 105 U/L (ref 38–126)
Bilirubin, Direct: 0.3 mg/dL — ABNORMAL HIGH (ref 0.0–0.2)
Indirect Bilirubin: 0.7 mg/dL (ref 0.3–0.9)
Total Bilirubin: 1 mg/dL (ref 0.3–1.2)
Total Protein: 6 g/dL — ABNORMAL LOW (ref 6.5–8.1)

## 2020-10-02 LAB — CBC
HCT: 25.9 % — ABNORMAL LOW (ref 39.0–52.0)
Hemoglobin: 9 g/dL — ABNORMAL LOW (ref 13.0–17.0)
MCH: 33 pg (ref 26.0–34.0)
MCHC: 34.7 g/dL (ref 30.0–36.0)
MCV: 94.9 fL (ref 80.0–100.0)
Platelets: 134 10*3/uL — ABNORMAL LOW (ref 150–400)
RBC: 2.73 MIL/uL — ABNORMAL LOW (ref 4.22–5.81)
RDW: 14.6 % (ref 11.5–15.5)
WBC: 5.2 10*3/uL (ref 4.0–10.5)
nRBC: 0 % (ref 0.0–0.2)

## 2020-10-02 LAB — BASIC METABOLIC PANEL
Anion gap: 7 (ref 5–15)
BUN: 51 mg/dL — ABNORMAL HIGH (ref 8–23)
CO2: 24 mmol/L (ref 22–32)
Calcium: 9.1 mg/dL (ref 8.9–10.3)
Chloride: 89 mmol/L — ABNORMAL LOW (ref 98–111)
Creatinine, Ser: 2.28 mg/dL — ABNORMAL HIGH (ref 0.61–1.24)
GFR, Estimated: 27 mL/min — ABNORMAL LOW (ref >60)
Glucose, Bld: 148 mg/dL — ABNORMAL HIGH (ref 70–99)
Potassium: 4.7 mmol/L (ref 3.5–5.1)
Sodium: 120 mmol/L — ABNORMAL LOW (ref 135–145)

## 2020-10-02 LAB — ACID FAST SMEAR (AFB, MYCOBACTERIA): Acid Fast Smear: NEGATIVE

## 2020-10-02 LAB — LACTIC ACID, PLASMA
Lactic Acid, Venous: 1.5 mmol/L (ref 0.5–1.9)
Lactic Acid, Venous: 1.7 mmol/L (ref 0.5–1.9)

## 2020-10-02 MED ORDER — MIDODRINE HCL 5 MG PO TABS
10.0000 mg | ORAL_TABLET | Freq: Once | ORAL | Status: AC
  Administered 2020-10-02: 10 mg via ORAL
  Filled 2020-10-02: qty 2

## 2020-10-02 MED ORDER — LEVOTHYROXINE SODIUM 25 MCG PO TABS
25.0000 ug | ORAL_TABLET | Freq: Every day | ORAL | Status: DC
  Administered 2020-10-03 – 2020-10-06 (×4): 25 ug via ORAL
  Filled 2020-10-02 (×4): qty 1

## 2020-10-02 MED ORDER — SODIUM CHLORIDE 0.9 % IV SOLN: INTRAVENOUS | Status: AC

## 2020-10-02 NOTE — Evaluation (Signed)
Physical Therapy Evaluation Patient Details Name: Patrick Mccormick MRN: [id number] DOB: [birth year] Today's Date: 10/02/2020   History of Present Illness  Patient is a 87 y.o. male with medical history significant for pulmonary hypertension, hypertension, history of SIADH, unintended weight loss, history of gastrectomy, persistent atrial fibrillation, iron deficiency anemia, presented to the emergency department for chief concerns of shortness of breath.  Found to have acute hypoxic respiratory failure/bilateral pleural effusion/PE. S/p right-sided thoracentesis. Anasarca.  Clinical Impression  Patient seen for PT evaluation. Patient is lethargic and complains of not sleeping last night. Patient is able to follow single step commands consistently with extra time. Patient required minimal assistance for bed mobility and for sit to stand transfers. Patient declined ambulating due to fatigue and required minimal assistance with taking a side step along edge of bed due to unsteadiness. Patient appears deconditioned and has generalized weakness. Orthostatic vitals taken during session. BP lying 89/54, BP sitting 86/52, BP standing 99/50 with no reported dizziness during session. Sp02 97-98% on 2 L 02 with activity. Recommend to continue PT to maximize independence and address functional limitations listed below. SNF recommended at discharge at this time.     Follow Up Recommendations SNF    Equipment Recommendations  None recommended by PT    Recommendations for Other Services       Precautions / Restrictions Precautions Precautions: Fall Restrictions Weight Bearing Restrictions: No      Mobility  Bed Mobility Overal bed mobility: Needs Assistance Bed Mobility: Supine to Sit;Sit to Supine     Supine to sit: Min guard Sit to supine: Min assist   General bed mobility comments: extra time required to complete tasks    Transfers Overall transfer level: Needs assistance   Transfers: Sit  to/from Stand Sit to Stand: Min assist         General transfer comment: decreased eccentric control with transfer. steadying assistance required  Ambulation/Gait Ambulation/Gait assistance: Min assist           General Gait Details: patient declined ambulation at this time due to fatigue. patient was unsteady with taking one side step along edge of bed and required minimal assistance for steadying  Stairs            Wheelchair Mobility    Modified Rankin (Stroke Patients Only)       Balance Overall balance assessment: Needs assistance Sitting-balance support: Feet supported Sitting balance-Leahy Scale: Fair     Standing balance support: No upper extremity supported Standing balance-Leahy Scale: Poor Standing balance comment: occasional minimal assistance is required for standing balance due to unsteadiness                             Pertinent Vitals/Pain Pain Assessment: No/denies pain    Home Living Family/patient expects to be discharged to:: Private residence (independent living facility) Living Arrangements: Spouse/significant other Available Help at Discharge: Family Type of Home: Apartment Home Access: Level entry     Home Layout: One level Home Equipment: Environmental consultant - 2 wheels;Shower seat      Prior Function Level of Independence: Independent with assistive device(s)         Comments: patient reports he is Mod I with ambultion with rolling walker and independent with ADLs     Hand Dominance        Extremity/Trunk Assessment   Upper Extremity Assessment Upper Extremity Assessment: Generalized weakness    Lower Extremity Assessment Lower Extremity Assessment: Generalized  weakness       Communication   Communication: No difficulties  Cognition Arousal/Alertness: Lethargic Behavior During Therapy: WFL for tasks assessed/performed Overall Cognitive Status: Difficult to assess                                  General Comments: patient follows single step commands with extra time and repetition      General Comments      Exercises     Assessment/Plan    PT Assessment Patient needs continued PT services  PT Problem List Decreased strength;Decreased activity tolerance;Decreased balance;Decreased mobility;Decreased safety awareness       PT Treatment Interventions DME instruction;Gait training;Functional mobility training;Therapeutic activities;Therapeutic exercise;Balance training;Neuromuscular re-education;Patient/family education    PT Goals (Current goals can be found in the Care Plan section)  Acute Rehab PT Goals Patient Stated Goal: increased strength PT Goal Formulation: With patient Time For Goal Achievement: 10/16/20 Potential to Achieve Goals: Fair    Frequency Min 2X/week   Barriers to discharge        Co-evaluation               AM-PAC PT "6 Clicks" Mobility  Outcome Measure Help needed turning from your back to your side while in a flat bed without using bedrails?: A Little Help needed moving from lying on your back to sitting on the side of a flat bed without using bedrails?: A Little Help needed moving to and from a bed to a chair (including a wheelchair)?: A Little Help needed standing up from a chair using your arms (e.g., wheelchair or bedside chair)?: A Little Help needed to walk in hospital room?: A Little Help needed climbing 3-5 steps with a railing? : A Lot 6 Click Score: 17    End of Session Equipment Utilized During Treatment: Oxygen Activity Tolerance: Patient limited by fatigue Patient left: in bed;with call bell/phone within reach;with bed alarm set;with nursing/sitter in room Nurse Communication: Mobility status PT Visit Diagnosis: Unsteadiness on feet (R26.81);Muscle weakness (generalized) (M62.81)    Time: 9518-8416 PT Time Calculation (min) (ACUTE ONLY): 26 min   Charges:   PT Evaluation $PT Eval Moderate Complexity: 1 Mod PT  Treatments $Therapeutic Activity: 8-22 mins        Donna Bernard, PT, MPT   Ina Homes 10/02/2020, 9:37 AM

## 2020-10-02 NOTE — Consult Note (Signed)
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  Admits to wt loss>40lbs.  He further endorses new watery diarrhea, three times per day for one week. He denies blood. He endorses history of persistent right pleural effusion status post thoracentesis in which he had about two liters removed from only his right lungs about two years ago.  : He is married and lives with his spouse of 50 years. 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 He has never been a tobacco user. He drinks about two glasses of wine per night. PCCM consultation for pulmnary hypertension.     Patient had thoracentesis donex2 in 2020 and "was trying to control that",  He had one thoracentesis after admission here at [facility] and was able to get 300cc thoracentesis done but asked for procedure to be stopped half way throughout.    [name] is Patrick Mccormick is present today. She shares he is a chronic drinker of alcohol 1 day prior to admission he drank 3 glasses of wine.  He had TTE which was essentially normal.   -10/02/20-patient weaned to 2L/min Patrick Mccormick.  He had episodes of hypotension despite midordrine.  He developed worsening aki overnight and renal consultation has been placed.  He has not diuresed well.  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  Vascular surgery has been  consulted.  Na levels are still low 120.  He remains without leukocytosis or fevers. Patient has been on lasix , he has IV fluids ordered today, I can dc lasix for now. Will repeat CXR today.  If patient is not surgical candidate he will need riociguat for CTEPH.   PAST MEDICAL HISTORY   History reviewed. No pertinent past medical history.   SURGICAL HISTORY   History reviewed. No pertinent surgical history.   FAMILY HISTORY   History reviewed. No pertinent family history.   SOCIAL HISTORY   Social History   Tobacco Use  . Smoking status: Never Smoker  . Smokeless tobacco: Never Used     MEDICATIONS    Home Medication:    Current Medication:  Current Facility-Administered Medications:  .  0.9 %  sodium chloride infusion, , Intravenous, Continuous, Patrick Mccormick, Patrick Neat, Patrick Mccormick, Last Rate: 75 mL/hr at 10/02/20 0910, New Bag at 10/02/20 0910 .  allopurinol (ZYLOPRIM) tablet 100 mg, 100 mg, Oral, Daily, Patrick Mccormick, Patrick Mccormick, Patrick Mccormick, 100 mg at 10/02/20 0911 .  apixaban (ELIQUIS) tablet 10 mg, 10 mg, Oral, BID, 10 mg at 10/02/20 0911 **FOLLOWED BY** [START ON 10/07/2020] apixaban (ELIQUIS) tablet 5 mg, 5 mg, Oral, BID, Patrick Mccormick, [name], Patrick Mccormick .  ascorbic acid (VITAMIN C) tablet 250 mg, 250 mg, Oral, BID, Patrick Courser, Patrick Mccormick, 250 mg at 10/02/20 0911 .  atorvastatin (LIPITOR) tablet 20 mg, 20 mg, Oral,  Daily, Patrick Mccormick, Patrick Mccormick, Patrick Mccormick, 20 mg at 10/02/20 0914 .  feeding supplement (ENSURE ENLIVE / ENSURE PLUS) liquid 237 mL, 237 mL, Oral, TID BM, Patrick Mccormick, [name], Patrick Mccormick, 237 mL at 10/02/20 1351 .  hydrocortisone sodium succinate (SOLU-CORTEF) 100 MG injection 50 mg, 50 mg, Intravenous, Q6H, Patrick Rosamilia, Patrick Mccormick, 50 mg at 10/02/20 1351 .  [START ON 10/03/2020] levothyroxine (SYNTHROID) tablet 25 mcg, 25 mcg, Oral, Q0600, Patrick Courser, Patrick Mccormick .  metoprolol tartrate (LOPRESSOR) injection 5 mg, 5 mg, Intravenous, Q4H PRN, Patrick Mccormick, Patrick Mccormick, Patrick Mccormick .  midodrine (PROAMATINE) tablet 10 mg, 10 mg, Oral, QID, Patrick Schwartz, NP, 10 mg at 10/02/20 1351 .   mirtazapine (REMERON) tablet 7.5 mg, 7.5 mg, Oral, QHS, Patrick Mccormick, Patrick Mccormick, Patrick Mccormick, 7.5 mg at 09/30/20 2121 .  multivitamin with minerals tablet 1 tablet, 1 tablet, Oral, Daily, Patrick Courser, Patrick Mccormick, 1 tablet at 10/02/20 0911 .  ondansetron (ZOFRAN) tablet 4 mg, 4 mg, Oral, Q6H PRN **OR** ondansetron (ZOFRAN) injection 4 mg, 4 mg, Intravenous, Q6H PRN, Patrick Mccormick, Patrick Mccormick, Patrick Mccormick .  sildenafil (REVATIO) tablet 20 mg, 20 mg, Oral, TID, Patrick Mccormick, Patrick Mccormick, Patrick Mccormick, 20 mg at 10/01/20 1730 .  sodium chloride (OCEAN) 0.65 % nasal spray 1 spray, 1 spray, Each Nare, PRN, Patrick Courser, Patrick Mccormick    ALLERGIES   Patient has no known allergies.     REVIEW OF SYSTEMS    Review of Systems:  Gen:  Denies  fever, sweats, chills weigh loss  HEENT: Denies blurred vision, double vision, ear pain, eye pain, hearing loss, nose bleeds, sore throat Cardiac:  No dizziness, chest pain or heaviness, chest tightness,edema Resp:   Denies cough or sputum porduction, shortness of breath,wheezing, hemoptysis,  Gi: Denies swallowing difficulty, stomach pain, nausea or vomiting, diarrhea, constipation, bowel incontinence Gu:  Denies bladder incontinence, burning urine Ext:   Denies Joint pain, stiffness or swelling Skin: Denies  skin rash, easy bruising or bleeding or hives Endoc:  Denies polyuria, polydipsia , polyphagia or weight change Psych:   Denies depression, insomnia or hallucinations   Other:  All other systems negative   VS: BP 90/62 (BP Location: Right Arm)   Pulse 77   Temp 97.8 F (36.6 C) (Axillary)   Resp 18   Ht 5\' 7"  (1.702 m)   Wt 38.7 kg   SpO2 100%   BMI 13.36 kg/m      PHYSICAL EXAM    GENERAL:NAD, no fevers, chills, no weakness no fatigue HEAD: Normocephalic, atraumatic.  EYES: Pupils equal, round, reactive to light. Extraocular muscles intact. No scleral icterus.  MOUTH: Moist mucosal membrane. Dentition intact. No abscess noted.  EAR, NOSE, THROAT: Clear without exudates. No external lesions.  NECK: Supple. No  thyromegaly. No nodules. No JVD.  PULMONARY: Decreased breath sounds bilaterally  CARDIOVASCULAR: S1 and S2. Regular rate and rhythm. No murmurs, rubs, or gallops. No edema. Pedal pulses 2+ bilaterally.  GASTROINTESTINAL: Soft, nontender, nondistended. No masses. Positive bowel sounds. No hepatosplenomegaly.  MUSCULOSKELETAL: No swelling, clubbing, or edema. Range of motion full in all extremities.  NEUROLOGIC: Cranial nerves II through XII are intact. No gross focal neurological deficits. Sensation intact. Reflexes intact.  SKIN: No ulceration, lesions, rashes, or cyanosis. Skin warm and dry. Turgor intact.  PSYCHIATRIC: Mood, affect within normal limits. The patient is awake, alert and oriented x 3. Insight, judgment intact.       IMAGING    CT Angio Chest PE W and/or Wo Contrast  Addendum Date: 09/29/2020   ADDENDUM REPORT: 09/29/2020 15:35  ADDENDUM: These results were called by telephone at the time of interpretation on 09/29/2020 at 3:35 pm to provider Patrick Patrick Mccormick , who verbally acknowledged these results. Electronically Signed   By: Kreg Shropshire M.D.   On: 09/29/2020 15:35   Result Date: 09/29/2020 CLINICAL DATA:  Dyspnea EXAM: CT ANGIOGRAPHY CHEST WITH CONTRAST TECHNIQUE: Multidetector CT imaging of the chest was performed using the standard protocol during bolus administration of intravenous contrast. Multiplanar CT image reconstructions and MIPs were obtained to evaluate the vascular anatomy. CONTRAST:  67mL OMNIPAQUE IOHEXOL 350 MG/ML SOLN COMPARISON:  Radiograph 09/29/2020 FINDINGS: Cardiovascular: Satisfactory opacification of pulmonary arteries. Some thin linear web-like bands of hypoattenuation in the distal left main pulmonary artery and lingular branches. Some more anti dependent hypoattenuation within the segmental pulmonary arteries of the left lower lobe have an appearance favored to be laminar flow and mixing artifact though a more subacute to remote pulmonary embolism is not fully  excluded. Central pulmonary arteries are enlarged. There is marked cardiomegaly with four-chamber enlargement. Three-vessel coronary artery atherosclerosis is present. Left chest wall pacer pack is noted with leads at the right atrium, coronary sinus and cardiac apex. Trace pericardial effusion. Suboptimal opacification of the aorta for luminal assessment. Atherosclerotic plaque within the normal caliber aorta. Normal 3 vessel branching of the aortic arch. Reflux of contrast in the hepatic veins and IVC. Mediastinum/Nodes: Diffuse edematous changes throughout the mediastinum. Scattered low-attenuation mediastinal and hilar lymph nodes favored to be reactive/edematous. No concerning axillary adenopathy. Fluid-filled, patulous thoracic esophagus. Trachea is unremarkable. No concerning thyroid abnormality though poorly evaluated with superimposed streak artifact. Lungs/Pleura: Complex, bilateral loculated pleural effusions which are predominantly low-attenuation. There are regions of dense basilar pleural calcification in both lungs. A more diffuse pleural thickening is noted in the right lung as well. There are areas of adjacent passive atelectasis with additional more focal nodular atelectatic changes in the right lung (6/64). Could reflect some rounded atelectasis though underlying infection or disease is not fully excluded. Additional areas of bandlike reticular opacity with architectural distortion and scarring. Diffuse airways thickening and scattered secretions are noted as well. No pneumothorax. Upper Abdomen: Diffuse edematous changes noted throughout the upper abdomen and mesentery with small volume ascites. Extensive atherosclerotic plaque in the upper abdominal aorta. Calcified septation in the infrarenal abdominal aorta may reflect sequela of prior dissection or lamellar calcification of the aorta itself, incompletely assessed on this exam. Postsurgical changes from prior gastric surgery. Musculoskeletal:  The osseous structures appear diffusely demineralized which may limit detection of small or nondisplaced fractures. No acute osseous abnormality or suspicious osseous lesion. Degenerative changes are present in the imaged spine and shoulders. Diffuse body wall edema. Review of the MIP images confirms the above findings. IMPRESSION: 1. Web-like filling defects in the distal left main pulmonary artery and extending into the lingular branches may reflect subacute to chronic pulmonary embolism with acute pulmonary embolism less favored. More antidependent incomplete opacification of the left lower lobe pulmonary arteries could reflect laminar flow artifact though underlying embolism is not excluded. Evaluation for right heart strain is precluded in the setting of diffuse cardiomegaly and four-chamber cardiac enlargement though there is evidence of elevated right heart pressures and right-sided dysfunction with enlarged pulmonary arteries and refluxing contrast into the hepatic veins and IVC. Consider further characterization with echocardiography as clinically warranted. 2. Complex, bilateral loculated pleural effusions which are predominantly low-attenuation. Associated regions of dense basilar pleural calcification in both lungs. Findings can be on the spectrum of underlying asbestos related  pleural disease though the sterility of these collections cannot be fully ascertained on an imaging basis. Extensive areas of passive atelectasis. More focal nodular atelectatic changes in the right lung as well. Could reflect some rounded atelectasis though underlying infection or disease is not fully excluded. Recommend attention on follow-up imaging. 3. Features of anasarca/volume overload with diffuse edematous changes throughout the mediastinum, mediastinum, upper abdomen, mesentery and body wall with small volume abdominal ascites. 4. Aortic Atherosclerosis (ICD10-I70.0). Currently attempting to contact the ordering provider  with a critical value result. Addendum will be submitted upon case discussion. Electronically Signed: By: Kreg ShropshirePrice  DeHay M.D. On: 09/29/2020 15:28   CT ABDOMEN PELVIS W CONTRAST  Result Date: 09/30/2020 CLINICAL DATA:  Unintentional weight loss EXAM: CT ABDOMEN AND PELVIS WITH CONTRAST TECHNIQUE: Multidetector CT imaging of the abdomen and pelvis was performed using the standard protocol following bolus administration of intravenous contrast. CONTRAST:  60mL OMNIPAQUE IOHEXOL 300 MG/ML  SOLN COMPARISON:  None. FINDINGS: Lower chest: Complex moderate right pleural effusion is again identified with associated pleural thickening. Small focus of extrapleural gas within this collection likely relates to recent thoracentesis. There is associated compressive atelectasis of the right lung base. Partially loculated left pleural effusion largely within the left major fissure appears stable since prior CT examination of 09/29/2020. Dense pleural calcifications at the left lung base likely relate to remote trauma or inflammation. Mild global cardiomegaly with particular enlargement of the a right ventricle and right atrium are again identified. Pacemaker leads are seen within the right heart and left ventricular venous outflow. Extensive calcifications are noted within the coronary arteries. Hepatobiliary: Reflux of contrast into the hepatic venous system is in keeping with at least some degree of right heart failure. Tiny probable cyst within the inferior right hepatic lobe. Liver otherwise unremarkable. No intra or extrahepatic biliary ductal dilation. Gallbladder unremarkable. Pancreas: Unremarkable Spleen: Unremarkable Adrenals/Urinary Tract: The adrenal glands are unremarkable. The kidneys are normal in position. There is mild-to-moderate bilateral renal cortical atrophy noted. Bilateral simple cortical cysts are identified. The kidneys are otherwise unremarkable. The bladder is unremarkable. Stomach/Bowel: Surgical  changes of a partial gastrectomy are identified. Moderate sigmoid diverticulosis. The stomach, small bowel, and large bowel are otherwise unremarkable. No evidence of obstruction or focal inflammation. Appendix normal. No free intraperitoneal gas. Mild ascites is present. Vascular/Lymphatic: There is extensive aortoiliac atherosclerotic calcification with particularly prominent atherosclerotic calcification at the origin of the a renal and mesenteric arterial vasculature almost certainly resulting in hemodynamically significant stenoses, not well characterized on this examination. Short segment focal dissection within the infrarenal abdominal aorta, likely the result of a remote penetrating atherosclerotic ulcer results in mild ectasia of the infrarenal abdominal aorta without frank aneurysm formation, with a maximal transaxial dimension of 2.6 cm. The common iliac arteries are ectatic bilaterally, left greater than right, without frank aneurysm formation. Extensive atherosclerotic calcification is noted within the lower extremity arterial inflow and visualized outflow. Reproductive: Prostate is unremarkable. Other: There is extensive subcutaneous edema noted throughout the body wall as well as retroperitoneal edema in keeping with changes of anasarca. Musculoskeletal: Degenerative changes are seen within the lumbar spine. No suspicious lytic or blastic bone lesions are seen. Degenerative changes are noted within the hips bilaterally. No acute bone abnormality. IMPRESSION: Complex right hydropneumothorax with small gaseous component likely related to recent thoracentesis. Cardiomegaly and extensive coronary artery calcification. Large mint of the right heart and reflux of contrast into the hepatic venous system in keeping with at least some degree of  right heart failure. Dense calcification at the left lung base likely result of remote trauma or inflammation. Diffuse body wall subcutaneous edema, mild ascites, and  retroperitoneal edema most in keeping with moderate anasarca, possibly the result of cardiogenic failure. Peripheral vascular disease with extensive atherosclerotic calcification at the origin of the mesenteric and renal vasculature almost certainly resulting in hemodynamically significant stenosis. Clinical correlation for signs and symptoms of chronic mesenteric ischemia and/or significant renal artery stenosis is recommended. Aortic Atherosclerosis (ICD10-I70.0). Electronically Signed   By: Helyn Numbers Patrick Mccormick   On: 09/30/2020 13:09   DG Chest Port 1 View  Result Date: 09/30/2020 CLINICAL DATA:  Status post right-sided thoracentesis. EXAM: PORTABLE CHEST 1 VIEW COMPARISON:  CT a chest in chest x-ray from yesterday. FINDINGS: Unchanged left chest wall pacemaker. Stable cardiomegaly and pulmonary artery enlargement. Loculated bilateral pleural effusions again noted, slightly decreased on the right status post thoracentesis. No pneumothorax. Unchanged pleural calcification at the lung bases and bilateral lower lobe atelectasis. No acute osseous abnormality. IMPRESSION: 1. Loculated bilateral pleural effusions, slightly decreased on the right status post thoracentesis. No pneumothorax. Electronically Signed   By: Obie Dredge M.D.   On: 09/30/2020 12:34   DG Chest Portable 1 View  Result Date: 09/29/2020 CLINICAL DATA:  Dyspnea EXAM: PORTABLE CHEST 1 VIEW COMPARISON:  None. FINDINGS: Bilateral basilar laterally loculated pleural effusions are present, moderate on the right and small on the left. Left basilar pleural calcifications are identified. There is enlargement of the right hilum suggesting presence of right hilar mass or right hilar adenopathy. There is partial right lower lobe collapse. Opacity within the left perihilar region may represent fluid within the left major fissure. A left perihilar mass, however, is difficult to exclude. No pneumothorax. Mild cardiomegaly. Left subclavian 3 lead pacemaker  in place. Vascular calcifications are seen within the abdominal aorta. The pulmonary vascularity is normal. No acute bone abnormality. IMPRESSION: Suspected right hilar mass or adenopathy with subtotal collapse of the right lower lobe possibly representing postobstructive collapse. Bilateral loculated pleural effusions, moderate on the right and small on the left. Coarse left basilar pleural calcification may relate to remote trauma or infection. Left perihilar opacity possibly representing fluid within the fissure or a left perihilar focal pulmonary mass. This would be better assessed with dedicated contrast enhanced CT imaging. Mild cardiomegaly. Electronically Signed   By: Helyn Numbers Patrick Mccormick   On: 09/29/2020 11:08   ECHOCARDIOGRAM COMPLETE  Result Date: 09/30/2020    ECHOCARDIOGRAM REPORT   Patient Name:   JOHNE BUCKLE Date of Exam: 09/30/2020 Medical Rec #:  [id number]       Height:       67.0 in Accession #:    [id number]      Weight:       88.3 lb Date of Birth:  [birth year]       BSA:          1.429 m Patient Age:    88 years        BP:           87/50 mmHg Patient Gender: M               HR:           76 bpm. Exam Location:  [facility] Procedure: 2D Echo, Color Doppler and Cardiac Doppler Indications:     R06.00 Dyspnea  History:         Patient has no prior history of Echocardiogram examinations.  Arrythmias:Paroxymal atrial fibrillation,                  Signs/Symptoms:Shortness of Breath; Risk Factors:Hypertension.  Sonographer:     Humphrey Rolls RDCS (AE) Referring Phys:  [id number] Patrick Mccormick Patrick Mccormick Diagnosing Phys: Harold Hedge Patrick Mccormick  Sonographer Comments: TDS due to bony thorax. IMPRESSIONS  1. Left ventricular ejection fraction, by estimation, is 65 to 70%. The left ventricle has normal function. The left ventricle has no regional wall motion abnormalities. Left ventricular diastolic parameters were normal.  2. Right ventricular systolic function is normal. The right ventricular size is mildly enlarged.   3. Left atrial size was mildly dilated.  4. Right atrial size was mildly dilated.  5. The mitral valve is grossly normal. Mild to moderate mitral valve regurgitation.  6. The aortic valve is calcified. Aortic valve regurgitation is trivial. Mild to moderate aortic valve sclerosis/calcification is present, without any evidence of aortic stenosis. FINDINGS  Left Ventricle: Left ventricular ejection fraction, by estimation, is 65 to 70%. The left ventricle has normal function. The left ventricle has no regional wall motion abnormalities. The left ventricular internal cavity size was normal in size. There is  borderline left ventricular hypertrophy. Left ventricular diastolic parameters were normal. Right Ventricle: The right ventricular size is mildly enlarged. No increase in right ventricular wall thickness. Right ventricular systolic function is normal. Left Atrium: Left atrial size was mildly dilated. Right Atrium: Right atrial size was mildly dilated. Pericardium: There is no evidence of pericardial effusion. Mitral Valve: The mitral valve is grossly normal. Mild to moderate mitral valve regurgitation. MV peak gradient, 3.1 mmHg. The mean mitral valve gradient is 1.0 mmHg. Tricuspid Valve: The tricuspid valve is not well visualized. Tricuspid valve regurgitation is trivial. Aortic Valve: The aortic valve is calcified. Aortic valve regurgitation is trivial. Mild to moderate aortic valve sclerosis/calcification is present, without any evidence of aortic stenosis. Aortic valve mean gradient measures 3.0 mmHg. Aortic valve peak  gradient measures 5.9 mmHg. Aortic valve area, by VTI measures 1.71 cm. Pulmonic Valve: The pulmonic valve was not well visualized. Pulmonic valve regurgitation is trivial. Aorta: The aortic root is normal in size and structure. IAS/Shunts: No atrial level shunt detected by color flow Doppler. Additional Comments: A pacer wire is visualized.  LEFT VENTRICLE PLAX 2D LVIDd:         3.50 cm  LVIDs:         2.10 cm LV PW:         1.10 cm LV IVS:        0.80 cm LVOT diam:     2.20 cm LV SV:         38 LV SV Index:   26 LVOT Area:     3.80 cm  RIGHT VENTRICLE RV Basal diam:  4.80 cm LEFT ATRIUM             Index       RIGHT ATRIUM           Index LA diam:        4.10 cm 2.87 cm/m  RA Area:     30.00 cm LA Vol (A2C):   40.4 ml 28.28 ml/m RA Volume:   112.00 ml 78.39 ml/m LA Vol (A4C):   92.3 ml 64.61 ml/m LA Biplane Vol: 65.3 ml 45.71 ml/m  AORTIC VALVE                   PULMONIC VALVE AV Area (Vmax):  1.97 cm    PV Vmax:       0.68 m/s AV Area (Vmean):   1.92 cm    PV Vmean:      42.900 cm/s AV Area (VTI):     1.71 cm    PV VTI:        0.128 m AV Vmax:           121.00 cm/s PV Peak grad:  1.9 mmHg AV Vmean:          79.000 cm/s PV Mean grad:  1.0 mmHg AV VTI:            0.220 m AV Peak Grad:      5.9 mmHg AV Mean Grad:      3.0 mmHg LVOT Vmax:         62.70 cm/s LVOT Vmean:        40.000 cm/s LVOT VTI:          0.099 m LVOT/AV VTI ratio: 0.45  AORTA Ao Root diam: 3.50 cm MITRAL VALVE MV Area (PHT): 6.96 cm    SHUNTS MV Area VTI:   2.36 cm    Systemic VTI:  0.10 m MV Peak grad:  3.1 mmHg    Systemic Diam: 2.20 cm MV Mean grad:  1.0 mmHg MV Vmax:       0.88 m/s MV Vmean:      47.8 cm/s MV Decel Time: 109 msec MV E velocity: 75.80 cm/s MV A velocity: 36.80 cm/s MV E/A ratio:  2.06 Harold Hedge Patrick Mccormick Electronically signed by Harold Hedge Patrick Mccormick Signature Date/Time: 09/30/2020/11:36:34 AM    Final    US LIVER DOPPLER  Result Date: 10/02/2020 CLINICAL DATA:  Hepatic cirrhosis EXAM: DUPLEX ULTRASOUND OF LIVER TECHNIQUE: Color and duplex Doppler ultrasound was performed to evaluate the hepatic in-flow and out-flow vessels. COMPARISON:  09/30/2020 CT with contrast FINDINGS: Liver: Increased hepatic echogenicity. No definite contour abnormality or significant surface nodularity. No focal lesion, mass or intrahepatic biliary ductal dilatation. Main Portal Vein size: 0.9 cm Portal Vein Velocities Main Prox:   21 cm/sec Main Mid: 40 cm/sec Main Dist:  36 cm/sec Right: 32 cm/sec Left: 24 cm/sec Hepatic Vein Velocities Right:  51 cm/sec Middle:  58 cm/sec Left:  51 cm/sec IVC: Present and patent with normal respiratory phasicity. Hepatic Artery Velocity:  71 cm/sec Splenic Vein Velocity:  14 cm/sec Spleen: 4 cm x 4 cm x 6 cm with a total volume of 62 cm^3 (411 cm^3 is upper limit normal) Portal Vein Occlusion/Thrombus: No Splenic Vein Occlusion/Thrombus: No Ascites: None Varices: None IMPRESSION: Hepatic veins and IVC are dilated suggesting component of right heart failure. Portal, hepatic and splenic veins are all patent with normal directional flow. No veno-occlusive process. Electronically Signed   By: Judie Petit.  Shick M.D.   On: 10/02/2020 11:19   US THORACENTESIS ASP PLEURAL SPACE W/IMG GUIDE  Result Date: 09/30/2020 INDICATION: Patient with history of pulmonary hypertension and persistent AFib. Found to have bilateral pleural effusions after presenting to the emergency department with shortness of breath. Team is requesting therapeutic and diagnostic thoracentesis EXAM: ULTRASOUND GUIDED RIGHT-SIDED THERAPEUTIC AND DIAGNOSTIC THORACENTESIS MEDICATIONS: Lidocaine 1% 10 mL COMPLICATIONS: None immediate. PROCEDURE: An ultrasound guided thoracentesis was thoroughly discussed with the patient and questions answered. The benefits, risks, alternatives and complications were also discussed. The patient understands and wishes to proceed with the procedure. Written consent was obtained. Ultrasound was performed to localize and mark an adequate pocket of fluid in the right chest. The  area was then prepped and draped in the normal sterile fashion. 1% Lidocaine was used for local anesthesia. Under ultrasound guidance a 6 Fr Safe-T-Centesis catheter was introduced. Thoracentesis was performed. The catheter was removed and a dressing applied. FINDINGS: A total of approximately 350 mL of amber colored fluid was removed. Samples were  sent to the laboratory as requested by the clinical team. Patient unable to tolerate additional fluid removal and per patient request the procedure was terminated. IMPRESSION: Successful ultrasound guided therapeutic and diagnostic right-sided thoracentesis yielding 350 mL of pleural fluid. Read by: Anders Grant, NP Electronically Signed   By: Simonne Come M.D.   On: 09/30/2020 12:54    ECHOCARDIOGRAM REPORT       Patient Name:  KENDRA WOOLFORD Date of Exam: 09/30/2020  Medical Rec #: [id number]    Height:    67.0 in  Accession #:  [id number]   Weight:    88.3 lb  Date of Birth: 07/16/1932    BSA:     1.429 m  Patient Age:  88 years    BP:      87/50 mmHg  Patient Gender: M        HR:      76 bpm.  Exam Location: [facility]   Procedure: 2D Echo, Color Doppler and Cardiac Doppler   Indications:   R06.00 Dyspnea    History:     Patient has no prior history of Echocardiogram  examinations.          Arrythmias:Paroxymal atrial fibrillation,          Signs/Symptoms:Shortness of Breath; Risk  Factors:Hypertension.    Sonographer:   Humphrey Rolls RDCS (AE)  Referring Phys: [id number] Patrick Mccormick Patrick Mccormick  Diagnosing Phys: Harold Hedge Patrick Mccormick     Sonographer Comments: TDS due to bony thorax.  IMPRESSIONS    1. Left ventricular ejection fraction, by estimation, is 65 to 70%. The  left ventricle has normal function. The left ventricle has no regional  wall motion abnormalities. Left ventricular diastolic parameters were  normal.  2. Right ventricular systolic function is normal. The right ventricular  size is mildly enlarged.  3. Left atrial size was mildly dilated.  4. Right atrial size was mildly dilated.  5. The mitral valve is grossly normal. Mild to moderate mitral valve  regurgitation.  6. The aortic valve is calcified. Aortic valve regurgitation is trivial.  Mild to moderate aortic valve sclerosis/calcification  is present, without  any evidence of aortic stenosis.   FINDINGS  Left Ventricle: Left ventricular ejection fraction, by estimation, is 65  to 70%. The left ventricle has normal function. The left ventricle has no  regional wall motion abnormalities. The left ventricular internal cavity  size was normal in size. There is  borderline left ventricular hypertrophy. Left ventricular diastolic  parameters were normal.   Right Ventricle: The right ventricular size is mildly enlarged. No  increase in right ventricular wall thickness. Right ventricular systolic  function is normal.   Left Atrium: Left atrial size was mildly dilated.   Right Atrium: Right atrial size was mildly dilated.   Pericardium: There is no evidence of pericardial effusion.   Mitral Valve: The mitral valve is grossly normal. Mild to moderate mitral  valve regurgitation. MV peak gradient, 3.1 mmHg. The mean mitral valve  gradient is 1.0 mmHg.   Tricuspid Valve: The tricuspid valve is not well visualized. Tricuspid  valve regurgitation is trivial.   Aortic Valve: The aortic valve is calcified. Aortic valve  regurgitation is  trivial. Mild to moderate aortic valve sclerosis/calcification is present,  without any evidence of aortic stenosis. Aortic valve mean gradient  measures 3.0 mmHg. Aortic valve peak  gradient measures 5.9 mmHg. Aortic valve area, by VTI measures 1.71 cm.   Pulmonic Valve: The pulmonic valve was not well visualized. Pulmonic valve  regurgitation is trivial.   Aorta: The aortic root is normal in size and structure.   IAS/Shunts: No atrial level shunt detected by color flow Doppler.   Additional Comments: A pacer wire is visualized.     LEFT VENTRICLE  PLAX 2D  LVIDd:     3.50 cm  LVIDs:     2.10 cm  LV PW:     1.10 cm  LV IVS:    0.80 cm  LVOT diam:   2.20 cm  LV SV:     38  LV SV Index:  26  LVOT Area:   3.80 cm     RIGHT VENTRICLE  RV Basal diam:  4.80 cm   LEFT ATRIUM       Index    RIGHT ATRIUM      Index  LA diam:    4.10 cm 2.87 cm/m RA Area:   30.00 cm  LA Vol (A2C):  40.4 ml 28.28 ml/m RA Volume:  112.00 ml 78.39 ml/m  LA Vol (A4C):  92.3 ml 64.61 ml/m  LA Biplane Vol: 65.3 ml 45.71 ml/m  AORTIC VALVE          PULMONIC VALVE  AV Area (Vmax):  1.97 cm  PV Vmax:    0.68 m/s  AV Area (Vmean):  1.92 cm  PV Vmean:   42.900 cm/s  AV Area (VTI):   1.71 cm  PV VTI:    0.128 m  AV Vmax:      121.00 cm/s PV Peak grad: 1.9 mmHg  AV Vmean:     79.000 cm/s PV Mean grad: 1.0 mmHg  AV VTI:      0.220 m  AV Peak Grad:   5.9 mmHg  AV Mean Grad:   3.0 mmHg  LVOT Vmax:     62.70 cm/s  LVOT Vmean:    40.000 cm/s  LVOT VTI:     0.099 m  LVOT/AV VTI ratio: 0.45    AORTA  Ao Root diam: 3.50 cm   MITRAL VALVE  MV Area (PHT): 6.96 cm  SHUNTS  MV Area VTI:  2.36 cm  Systemic VTI: 0.10 m  MV Peak grad: 3.1 mmHg  Systemic Diam: 2.20 cm  MV Mean grad: 1.0 mmHg  MV Vmax:    0.88 m/s  MV Vmean:   47.8 cm/s  MV Decel Time: 109 msec  MV E velocity: 75.80 cm/s  MV A velocity: 36.80 cm/s  MV E/A ratio: 2.06   Harold Hedge Patrick Mccormick  Electronically signed by Harold Hedge Patrick Mccormick  Signature Date/Time: 09/30/2020/11:36:34 AM       ASSESSMENT/PLAN    Chronic pulmonary effusions -Patient is not in heart failure per most recent TTE -will obtain US liver due to history of EtOH use  -he has mild aki today buy in general his GFR is within reference range when corrected for age.  -will place patient on midordrine and lasix today and hold revatio for now -Na is elevated likely due to liver dz with third spaced fluid in pleural space -thus far pleural fluid showing borderline exudate lymphocyte predominant which likely reflects chronic effusion.  The remainder of fluid studies including  cytology is in process. 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    Patient/Family are satisfied with care plan and all questions have been answered.  This document was prepared using Dragon voice recognition software and may include unintentional dictation errors.     Vida Rigger, M.D.  Division of Pulmonary & Critical Care Medicine  [facility]

## 2020-10-02 NOTE — Progress Notes (Signed)
PROGRESS NOTE    Patrick Mccormick  GBT:[id number] DOB: Jun 13, 1932 DOA: 09/29/2020 PCP: Conan Bowens., MD   Brief Narrative: Taken from H&P. Patrick Mccormick is a 87 y.o. male with medical history significant for pulmonary hypertension, hypertension, history of SIADH, unintended weight loss, history of gastrectomy, persistent atrial fibrillation, iron deficiency anemia, presented to the emergency department for chief concerns of shortness of breath.   At baseline, patient is not on home oxygen.  He endorses shortness of breath x one week. He has unintentionally lost about 40 lbs in 1.5 years (150ish to 110) and extreme exhaustion and does not get hungry. He reports poor appetite.   Found to have bilateral pleural effusions, history of thoracentesis done twice. And hypoxic requiring supplemental oxygen.  Labs pertinent for hyponatremia with sodium of 122.  Patient recently moved from daughter home and most of his care was at [facility]. He wants to get established in [place] to avoid traveling.  Subjective: Patient has no new complaint today.  Sitting in bed comfortably.  Son at bedside.  Continues to feel overall weak.  Assessment & Plan:   Principal Problem:   Respiratory distress Active Problems:   Pleural effusion due to congestive heart failure (HCC)   Pulmonary hypertension (HCC)   Atrial fibrillation, chronic (HCC)   History of partial gastrectomy   Protein-calorie malnutrition, severe  Acute hypoxic respiratory failure/bilateral pleural effusion/PE.  CTA positive for PE but this seems chronic.  PE involving left main pulmonary artery and extending into the lingular branches may reflect chronic pulmonary embolism with acute pulmonary embolism less favored however there was antidependent incomplete opacification of the left lower lobe pulmonary arteries could reflect laminar flow artifact however underlying pulmonary embolism cannot be excluded there is evidence of right heart  strain. Also had bilateral pleural effusions s/p right-sided thoracentesis with removal of 350 cc of fluid, further removal was discontinued at patient's request. History of recurrent pleural effusions and had thoracentesis twice in the past 2 years.  Questionable pleural calcification which will need further investigation. Currently saturating on room air. Echocardiogram with normal EF and pulmonary hypertension.  Elevated BNP. CT abdomen is concerning for anasarca and hepatic congestion more consistent with right-sided heart failure. Thoracentesis labs so far borderline exudative with lymphocytic predominant,  Cultures negative, cytology pending. -Increase the dose of Eliquis to full for treatment. -Pulmonary was consulted-appreciate their recommendations. -Patient was started on midodrine with Lasix by pulmonary, resulted in an increase in creatinine today, we will hold Lasix today and give him some gentle fluid. -Liver ultrasound Doppler-with increased liver echogenicity, no surface changes, dilated hepatic vein and IVC which were more consistent with right heart failure.  Most likely have cor pulmonale then.  Anasarca.  CT abdomen and pelvis with concern of anasarca and concern of right-sided heart failure.  Patient uses Lasix 20 mg twice daily. Repeat echocardiogram with normal EF, no wall motion abnormalities, normal biventricular systolic and diastolic function, no comment on pulmonary pressure.  Lasix with resulted increase in creatinine. -Giving some gentle IV fluid -Encourage p.o. hydration as we would like to avoid volume depletion. -Closely monitor volume status. -Daily BMP and weight -Strict intake and output  Unintentional weight loss.  By looking at her providers notes in care everywhere it looks like weight loss started after getting partial gastrectomy in May 2020 secondary to [facility] ulcer.  CT chest with some concern of calcifications at left lung base.  CT abdomen and pelvis  was done today which was negative for  any concerning mass but did show extensive atherosclerosis. TSH borderline high with normal free T4 and low T3.  Vitamin B12 elevated, copper levels pending which were checked as he has partial gastrectomy. -We will start him on Synthroid-will need a repeat check in 4 weeks and a close follow-up with PCP for optimization of dose. -Dietitian consult to improve nutrition. -We will need further work-up as an outpatient  AKI with CKD stage IIIb.  Some increase in creatinine and BUN.  Difficult case as he needs diuresis.  Some evidence of anasarca on imaging but clinically does not appear that volume up.  Dilated hepatic vein and IVC on liver Dopplers. -Nephrology was consulted. -Giving some gentle IV fluid -Continue to monitor  Hyponatremia.  Worsening with sodium at 120 today  Patient has an history of SIADH per chart review.  Sodium was within normal limit when last time checked in November 2021. Hyponatremia labs with low serum osmolality low serum osmolality and urinary sodium.  Some concern of secondary to liver disease as patient drinks alcohol regularly. -Nephrology was consulted-appreciate their help. -Monitor sodium  -Give some gentle IV fluid with normal saline.  Severe protein caloric malnutrition.  Most likely secondary to partial gastrectomy. -Dietitian consult.  Pulmonary hypertension.  Patient followed up with pulmonary at [facility] and wants to establish locally. -Pulmonary consult -Pulmonary hold sildenafil.  Persistent atrial fibrillation/sick sinus s/p permanent pacemaker. Continue home dose of atenolol Increase the dose of Eliquis from 2.5 to full dose for treatment of PE  Diarrhea.  Resolved.  Generalized weakness. PT is recommending SNF. -TOC for placement  Objective: Vitals:   10/02/20 0157 10/02/20 0344 10/02/20 0756 10/02/20 1300  BP: (!) 77/47 (!) 87/54 (!) 81/53 (!) 89/50  Pulse: (!) 59 60 60 67  Resp:  Temp:   98.4 F (36.9 C) 97.8 F (36.6 C) 98.1 F (36.7 C)  TempSrc:  Oral Oral Oral  SpO2:  100% 100%   Weight:  38.7 kg    Height:        Intake/Output Summary (Last 24 hours) at 10/02/2020 1416 Last data filed at 10/02/2020 0900 Gross per 24 hour  Intake 360 ml  Output 0 ml  Net 360 ml   Filed Weights   09/30/20 0415 10/01/20 0413 10/02/20 0344  Weight: 40.1 kg 38.8 kg 38.7 kg    Examination:  General.  Frail and cachectic elderly man, in no acute distress. Pulmonary.  Lungs clear bilaterally, normal respiratory effort. CV.  Regular rate and rhythm, no JVD, rub or murmur. Abdomen.  Soft, nontender, nondistended, BS positive. CNS.  Alert and oriented x3.  No focal neurologic deficit. Extremities.  No edema, no cyanosis, pulses intact and symmetrical. Psychiatry.  Judgment and insight appears normal.  DVT prophylaxis: Eliquis Code Status: Partial Family Communication: Son was updated at bedside Disposition Plan:  Status is: Inpatient  Remains inpatient appropriate because:Inpatient level of care appropriate due to severity of illness   Dispo: The patient is from: Home              Anticipated d/c is to: SNF              Anticipated d/c date is: 2 days              Patient currently is not medically stable to d/c.   Difficult to place patient No              Level of care: Progressive Cardiac  Consultants:  Vascular surgery  Pulmonology  Procedures:  Antimicrobials:   Data Reviewed: I have personally reviewed following labs and imaging studies  CBC: Recent Labs  Lab 09/29/20 1102 09/30/20 0432 10/01/20 0432 10/02/20 0444  WBC 4.0 3.8* 4.7 5.2  NEUTROABS 2.8  --   --   --   HGB 11.4* 10.2* 9.8* 9.0*  HCT 33.1* 28.4* 28.8* 25.9*  MCV 93.8 91.0 94.4 94.9  PLT 157 128* 130* 134*   Basic Metabolic Panel: Recent Labs  Lab 09/30/20 0432 09/30/20 1614 10/01/20 0432 10/01/20 1645 10/02/20 0444  NA 122* 120* 122* 121* 120*  K 4.1 4.0 4.3 4.5 4.7  CL 89*  88* 89* 90* 89*  CO2 21* 22 24 23 24   GLUCOSE 45* 104* 89 94 148*  BUN 35* 34* 40* 46* 51*  CREATININE 1.24 1.24 1.71* 1.81* 2.28*  CALCIUM 8.9 8.7* 9.0 9.0 9.1   GFR: Estimated Creatinine Clearance: 12.7 mL/min (A) (by C-G formula based on SCr of 2.28 mg/dL (H)). Liver Function Tests: Recent Labs  Lab 09/29/20 1102 10/02/20 0444  AST 33 34  ALT 14 16  ALKPHOS 137* 105  BILITOT 1.5* 1.0  PROT 7.0 6.0*  ALBUMIN 3.5 3.2*   No results for input(s): LIPASE, AMYLASE in the last 168 hours. No results for input(s): AMMONIA in the last 168 hours. Coagulation Profile: Recent Labs  Lab 09/29/20 1102  INR 1.6*   Cardiac Enzymes: No results for input(s): CKTOTAL, CKMB, CKMBINDEX, TROPONINI in the last 168 hours. BNP (last 3 results) No results for input(s): PROBNP in the last 8760 hours. HbA1C: No results for input(s): HGBA1C in the last 72 hours. CBG: Recent Labs  Lab 09/30/20 0909 09/30/20 0957 09/30/20 1126 10/01/20 2123  GLUCAP 37* 119* 93 217*   Lipid Profile: No results for input(s): CHOL, HDL, LDLCALC, TRIG, CHOLHDL, LDLDIRECT in the last 72 hours. Thyroid Function Tests: Recent Labs    09/30/20 1152  FREET4 1.32*   Anemia Panel: Recent Labs    10/01/20 0432  VITAMINB12 1,616*   Sepsis Labs: Recent Labs  Lab 09/29/20 1310  PROCALCITON 0.16    Recent Results (from the past 240 hour(s))  Resp Panel by RT-PCR (Flu A&B, Covid) Nasopharyngeal Swab     Status: None   Collection Time: 09/29/20 11:02 AM   Specimen: Nasopharyngeal Swab; Nasopharyngeal(NP) swabs in vial transport medium  Result Value Ref Range Status   SARS Coronavirus 2 by RT PCR NEGATIVE NEGATIVE Final    Comment: (NOTE) SARS-CoV-2 target nucleic acids are NOT DETECTED.  The SARS-CoV-2 RNA is generally detectable in upper respiratory specimens during the acute phase of infection. The lowest concentration of SARS-CoV-2 viral copies this assay can detect is 138 copies/mL. A negative  result does not preclude SARS-Cov-2 infection and should not be used as the sole basis for treatment or other patient management decisions. A negative result may occur with  improper specimen collection/handling, submission of specimen other than nasopharyngeal swab, presence of viral mutation(s) within the areas targeted by this assay, and inadequate number of viral copies(<138 copies/mL). A negative result must be combined with clinical observations, patient history, and epidemiological information. The expected result is Negative.  Fact Sheet for Patients:  11/27/20  Fact Sheet for Healthcare Providers:  BloggerCourse.com  This test is no t yet approved or cleared by the [place] FDA and  has been authorized for detection and/or diagnosis of SARS-CoV-2 by FDA under an Emergency Use Authorization (EUA). This EUA will remain  in effect (  meaning this test can be used) for the duration of the COVID-19 declaration under Section 564(b)(1) of the Act, 21 U.S.C.section 360bbb-3(b)(1), unless the authorization is terminated  or revoked sooner.       Influenza A by PCR NEGATIVE NEGATIVE Final   Influenza B by PCR NEGATIVE NEGATIVE Final    Comment: (NOTE) The Xpert Xpress SARS-CoV-2/FLU/RSV plus assay is intended as an aid in the diagnosis of influenza from Nasopharyngeal swab specimens and should not be used as a sole basis for treatment. Nasal washings and aspirates are unacceptable for Xpert Xpress SARS-CoV-2/FLU/RSV testing.  Fact Sheet for Patients: BloggerCourse.comhttps://www.fda.gov/media/152166/download  Fact Sheet for Healthcare Providers: SeriousBroker.ithttps://www.fda.gov/media/152162/download  This test is not yet approved or cleared by the [place] FDA and has been authorized for detection and/or diagnosis of SARS-CoV-2 by FDA under an Emergency Use Authorization (EUA). This EUA will remain in effect (meaning this test can be used)  for the duration of the COVID-19 declaration under Section 564(b)(1) of the Act, 21 U.S.C. section 360bbb-3(b)(1), unless the authorization is terminated or revoked.  Performed at [facility], [address] [place], [place] [address]   MRSA PCR Screening     Status: None   Collection Time: 09/29/20  6:41 PM   Specimen: Nasopharyngeal  Result Value Ref Range Status   MRSA by PCR NEGATIVE NEGATIVE Final    Comment:        The GeneXpert MRSA Assay (FDA approved for NASAL specimens only), is one component of a comprehensive MRSA colonization surveillance program. It is not intended to diagnose MRSA infection nor to guide or monitor treatment for MRSA infections. Performed at [facility], [address] [place], [place] [address]   Body fluid culture     Status: None (Preliminary result)   Collection Time: 09/30/20 12:00 PM   Specimen: PATH Cytology Pleural fluid  Result Value Ref Range Status   Specimen Description   Final    PLEURAL Performed at [facility], [address] [place], [place] [address]    Special Requests   Final    NONE Performed at [facility], [address] [place], [place] [address]    Gram Stain NO WBC SEEN NO ORGANISMS SEEN   Final   Culture   Final    NO GROWTH 2 DAYS Performed at [facility], [address] [place], [place] [address]    Report Status PENDING  Incomplete  Acid Fast Smear (AFB)     Status: None   Collection Time: 09/30/20 12:00 PM   Specimen: PATH Cytology Pleural fluid  Result Value Ref Range Status   AFB Specimen Processing Concentration  Final   Acid Fast Smear Negative  Final    Comment: (NOTE) Performed At: [facility] [address] [place], [place] [id number] Jolene SchimkeNagendra Sanjai MD ON:[id number]:[phone]    Source (AFB) PLEURAL  Final    Comment: Performed at [facility], [address] [place], [place] [address]     Radiology Studies: US LIVER  DOPPLER  Result Date: 10/02/2020 CLINICAL DATA:  Hepatic cirrhosis EXAM: DUPLEX ULTRASOUND OF LIVER TECHNIQUE: Color and duplex Doppler ultrasound was performed to evaluate the hepatic in-flow and out-flow vessels. COMPARISON:  09/30/2020 CT with contrast FINDINGS: Liver: Increased hepatic echogenicity. No definite contour abnormality or significant surface nodularity. No focal lesion, mass or intrahepatic biliary ductal dilatation. Main Portal Vein size: 0.9 cm Portal Vein Velocities Main Prox:  21 cm/sec Main Mid: 40 cm/sec Main Dist:  36 cm/sec Right: 32 cm/sec Left: 24 cm/sec Hepatic Vein Velocities Right:  51 cm/sec Middle:  58 cm/sec Left:  51 cm/sec IVC: Present and patent with normal respiratory phasicity. Hepatic Artery Velocity:  71 cm/sec Splenic Vein Velocity:  14 cm/sec Spleen: 4 cm x 4 cm x 6 cm with a total volume of 62 cm^3 (411 cm^3 is upper limit normal) Portal Vein Occlusion/Thrombus: No Splenic Vein Occlusion/Thrombus: No Ascites: None Varices: None IMPRESSION: Hepatic veins and IVC are dilated suggesting component of right heart failure. Portal, hepatic and splenic veins are all patent with normal directional flow. No veno-occlusive process. Electronically Signed   By: Judie Petit.  Shick M.D.   On: 10/02/2020 11:19    Scheduled Meds: . allopurinol  100 mg Oral Daily  . apixaban  10 mg Oral BID   Followed by  . [START ON 10/07/2020] apixaban  5 mg Oral BID  . vitamin C  250 mg Oral BID  . atorvastatin  20 mg Oral Daily  . feeding supplement  237 mL Oral TID BM  . furosemide  40 mg Intravenous Once  . hydrocortisone sod succinate (SOLU-CORTEF) inj  50 mg Intravenous Q6H  . midodrine  10 mg Oral QID  . mirtazapine  7.5 mg Oral QHS  . multivitamin with minerals  1 tablet Oral Daily  . sildenafil  20 mg Oral TID   Continuous Infusions: . sodium chloride 75 mL/hr at 10/02/20 0910     LOS: 2 days   Time spent: 25 minutes.  [name], MD [facility]  If 7PM-7AM, please  contact night-coverage [email or web address]  10/02/2020, 2:16 PM   This record has been created using Conservation officer, historic buildings. Errors have been sought and corrected,but may not always be located. Such creation errors do not reflect on the standard of care.

## 2020-10-02 NOTE — Consult Note (Signed)
[facility]  CONSULT NOTE    Date: 10/02/2020                  Patient Name:  Patrick Mccormick  MRN: [id number]  DOB: 1931-11-03  Age / Sex: 87 y.o., male         PCP: Conan Bowens., MD                 Service Requesting Consult: Dr. Nelson Chimes                 Reason for Consult: Hyponatremia and acute kidney injury            History of Present Illness: Patrick Mccormick was admitted to [facility] for shortness of breath and oxygen requirement. History taken with assistance of son who is at bedside. Patient was found to have worsening creatinine, hyponatremia and interstitial edema with pleural effusions.   Patient states he drinks 4 glasses of wine per day. Patient reports he does not eat much. He has been started on mirtazepine which patient states does not help with his appetite.   Sodium on admission of 122. Baseline seems to be 126-135.    Medications: Outpatient medications: Medications Prior to Admission  Medication Sig Dispense Refill Last Dose  . allopurinol (ZYLOPRIM) 100 MG tablet Take 100 mg by mouth daily.     Marland Kitchen atenolol (TENORMIN) 50 MG tablet Take 50 mg by mouth daily.   09/27/2020 at PM  . atorvastatin (LIPITOR) 20 MG tablet Take 20 mg by mouth daily.   09/27/2020 at PM  . ELIQUIS 2.5 MG TABS tablet Take 2.5 mg by mouth daily.   09/27/2020 at PM  . furosemide (LASIX) 20 MG tablet Take 20 mg by mouth 2 (two) times daily.   09/27/2020 at PM  . mirtazapine (REMERON) 7.5 MG tablet Take 7.5 mg by mouth at bedtime.   09/27/2020 at PM  . sildenafil (REVATIO) 20 MG tablet Take 20 mg by mouth 3 (three) times daily.   09/27/2020 at PM    Current medications: Current Facility-Administered Medications  Medication Dose Route Frequency Provider Last Rate Last Admin  . 0.9 %  sodium chloride infusion   Intravenous Continuous [name], MD 75 mL/hr at 10/02/20 0910 New Bag at 10/02/20 0910  . acetaminophen (TYLENOL) tablet 325 mg  325 mg Oral Q6H PRN Cox, Amy N,  DO       Or  . acetaminophen (TYLENOL) suppository 325 mg  325 mg Rectal Q6H PRN Cox, Amy N, DO      . allopurinol (ZYLOPRIM) tablet 100 mg  100 mg Oral Daily Cox, Amy N, DO   100 mg at 10/02/20 0911  . apixaban (ELIQUIS) tablet 10 mg  10 mg Oral BID [name], MD   10 mg at 10/02/20 0911   Followed by  . [START ON 10/07/2020] apixaban (ELIQUIS) tablet 5 mg  5 mg Oral BID [name], MD      . ascorbic acid (VITAMIN C) tablet 250 mg  250 mg Oral BID [name], MD   250 mg at 10/02/20 0911  . atenolol (TENORMIN) tablet 50 mg  50 mg Oral Daily Cox, Amy N, DO   50 mg at 10/01/20 1044  . atorvastatin (LIPITOR) tablet 20 mg  20 mg Oral Daily Cox, Amy N, DO   20 mg at 10/02/20 0914  . feeding supplement (ENSURE ENLIVE / ENSURE PLUS) liquid 237 mL  237 mL Oral TID BM  [name], MD   237 mL at 10/02/20 0914  . furosemide (LASIX) injection 40 mg  40 mg Intravenous Once [name], MD      . hydrocortisone sodium succinate (SOLU-CORTEF) 100 MG injection 50 mg  50 mg Intravenous Q6H Vida Rigger, MD   50 mg at 10/02/20 0915  . metoprolol tartrate (LOPRESSOR) injection 5 mg  5 mg Intravenous Q4H PRN Cox, Amy N, DO      . midodrine (PROAMATINE) tablet 10 mg  10 mg Oral QID Manuela Schwartz, NP   10 mg at 10/02/20 0911  . mirtazapine (REMERON) tablet 7.5 mg  7.5 mg Oral QHS Cox, Amy N, DO   7.5 mg at 09/30/20 2121  . multivitamin with minerals tablet 1 tablet  1 tablet Oral Daily [name], MD   1 tablet at 10/02/20 0911  . ondansetron (ZOFRAN) tablet 4 mg  4 mg Oral Q6H PRN Cox, Amy N, DO       Or  . ondansetron (ZOFRAN) injection 4 mg  4 mg Intravenous Q6H PRN Cox, Amy N, DO      . sildenafil (REVATIO) tablet 20 mg  20 mg Oral TID Cox, Amy N, DO   20 mg at 10/01/20 1730  . sodium chloride (OCEAN) 0.65 % nasal spray 1 spray  1 spray Each Nare PRN [name], MD          Allergies: No Known Allergies    Past Medical History: History reviewed. No pertinent past medical  history.   Past Surgical History: History reviewed. No pertinent surgical history.   Family History: History reviewed. No pertinent family history.   Social History: Social History   Socioeconomic History  . Marital status: Married    Spouse name: Not on file  . Number of children: Not on file  . Years of education: Not on file  . Highest education level: Not on file  Occupational History  . Not on file  Tobacco Use  . Smoking status: Never Smoker  . Smokeless tobacco: Never Used  Substance and Sexual Activity  . Alcohol use: Not on file  . Drug use: Not on file  . Sexual activity: Not on file  Other Topics Concern  . Not on file  Social History Narrative  . Not on file   Social Determinants of Health   Financial Resource Strain: Not on file  Food Insecurity: Not on file  Transportation Needs: Not on file  Physical Activity: Not on file  Stress: Not on file  Social Connections: Not on file  Intimate Partner Violence: Not on file     Review of Systems: ROS  Vital Signs: Blood pressure (!) 81/53, pulse 60, temperature 97.8 F (36.6 C), temperature source Oral, resp. rate 16, height  (1.702 m), weight 38.7 kg, SpO2 100 %.  Weight trends: Filed Weights   09/30/20 0415 10/01/20 0413 10/02/20 0344  Weight: 40.1 kg 38.8 kg 38.7 kg    Physical Exam: General: Cachectic. Laying in bed  Head: Normocephalic, atraumatic. Dry oral mucosal membranes  Eyes: Anicteric, PERRL  Neck: Supple, trachea midline  Lungs:  Bilateral rhonchi and +wheezes  On 2 L [place] O2  Heart: Regular rate and rhythm  Abdomen:  Soft, nontender,   Extremities: no peripheral edema.  Neurologic: Nonfocal, moving all four extremities  Skin: No lesions         Lab results: Basic Metabolic Panel: Recent Labs  Lab 10/01/20 0432 10/01/20 1645 10/02/20 0444  NA 122* 121*  120*  K 4.3 4.5 4.7  CL 89* 90* 89*  CO2 24 23 24   GLUCOSE 89 94 148*  BUN 40* 46* 51*  CREATININE 1.71*  1.81* 2.28*  CALCIUM 9.0 9.0 9.1    Liver Function Tests: Recent Labs  Lab 09/29/20 1102  AST 33  ALT 14  ALKPHOS 137*  BILITOT 1.5*  PROT 7.0  ALBUMIN 3.5   No results for input(s): LIPASE, AMYLASE in the last 168 hours. No results for input(s): AMMONIA in the last 168 hours.  CBC: Recent Labs  Lab 09/29/20 1102 09/30/20 0432 10/01/20 0432 10/02/20 0444  WBC 4.0 3.8* 4.7 5.2  NEUTROABS 2.8  --   --   --   HGB 11.4* 10.2* 9.8* 9.0*  HCT 33.1* 28.4* 28.8* 25.9*  MCV 93.8 91.0 94.4 94.9  PLT 157 128* 130* 134*    Cardiac Enzymes: No results for input(s): CKTOTAL, CKMB, CKMBINDEX, TROPONINI in the last 168 hours.  BNP: Invalid input(s): POCBNP  CBG: Recent Labs  Lab 09/30/20 0909 09/30/20 0957 09/30/20 1126 10/01/20 2123  GLUCAP 37* 119* 93 217*    Microbiology: Results for orders placed or performed during the hospital encounter of 09/29/20  Resp Panel by RT-PCR (Flu A&B, Covid) Nasopharyngeal Swab     Status: None   Collection Time: 09/29/20 11:02 AM   Specimen: Nasopharyngeal Swab; Nasopharyngeal(NP) swabs in vial transport medium  Result Value Ref Range Status   SARS Coronavirus 2 by RT PCR NEGATIVE NEGATIVE Final    Comment: (NOTE) SARS-CoV-2 target nucleic acids are NOT DETECTED.  The SARS-CoV-2 RNA is generally detectable in upper respiratory specimens during the acute phase of infection. The lowest concentration of SARS-CoV-2 viral copies this assay can detect is 138 copies/mL. A negative result does not preclude SARS-Cov-2 infection and should not be used as the sole basis for treatment or other patient management decisions. A negative result may occur with  improper specimen collection/handling, submission of specimen other than nasopharyngeal swab, presence of viral mutation(s) within the areas targeted by this assay, and inadequate number of viral copies(<138 copies/mL). A negative result must be combined with clinical observations,  patient history, and epidemiological information. The expected result is Negative.  Fact Sheet for Patients:  11/27/20  Fact Sheet for Healthcare Providers:  BloggerCourse.com  This test is no t yet approved or cleared by the [employer]ious[employer] FDA and  has been authorized for detection and/or diagnosis of SARS-CoV-2 by FDA under an Emergency Use Authorization (EUA). This EUA will remain  in effect (meaning this test can be used) for the duration of the COVID-19 declaration under Section 564(b)(1) of the Act, 21 U.S.C.section 360bbb-3(b)(1), unless the authorization is terminated  or revoked sooner.       Influenza A by PCR NEGATIVE NEGATIVE Final   Influenza B by PCR NEGATIVE NEGATIVE Final    Comment: (NOTE) The Xpert Xpress SARS-CoV-2/FLU/RSV plus assay is intended as an aid in the diagnosis of influenza from Nasopharyngeal swab specimens and should not be used as a sole basis for treatment. Nasal washings and aspirates are unacceptable for Xpert Xpress SARS-CoV-2/FLU/RSV testing.  Fact Sheet for Patients: [place]  Fact Sheet for Healthcare Providers: BloggerCourse.com  This test is not yet approved or cleared by the SeriousBroker.it FDA and has been authorized for detection and/or diagnosis of SARS-CoV-2 by FDA under an Emergency Use Authorization (EUA). This EUA will remain in effect (meaning this test can be used) for the duration of the COVID-19 declaration under Section  564(b)(1) of the Act, 21 U.S.C. section 360bbb-3(b)(1), unless the authorization is terminated or revoked.  Performed at [facility], [address] [place], [place] [address]   MRSA PCR Screening     Status: None   Collection Time: 09/29/20  6:41 PM   Specimen: Nasopharyngeal  Result Value Ref Range Status   MRSA by PCR NEGATIVE NEGATIVE Final    Comment:        The  GeneXpert MRSA Assay (FDA approved for NASAL specimens only), is one component of a comprehensive MRSA colonization surveillance program. It is not intended to diagnose MRSA infection nor to guide or monitor treatment for MRSA infections. Performed at [facility], [address] [place], [place] [address]   Body fluid culture     Status: None (Preliminary result)   Collection Time: 09/30/20 12:00 PM   Specimen: PATH Cytology Pleural fluid  Result Value Ref Range Status   Specimen Description   Final    PLEURAL Performed at [facility], [address] [place], [place] [address]    Special Requests   Final    NONE Performed at [facility], [address] [place], [place] [address]    Gram Stain NO WBC SEEN NO ORGANISMS SEEN   Final   Culture   Final    NO GROWTH < 24 HOURS Performed at [facility], [address] [place], [place] [address]    Report Status PENDING  Incomplete  Acid Fast Smear (AFB)     Status: None   Collection Time: 09/30/20 12:00 PM   Specimen: PATH Cytology Pleural fluid  Result Value Ref Range Status   AFB Specimen Processing Concentration  Final   Acid Fast Smear Negative  Final    Comment: (NOTE) Performed At: [facility] [address] [place], [place] [id number] Jolene Schimke MD ON:[id number]    Source (AFB) PLEURAL  Final    Comment: Performed at [facility], [address] [place], [place] [address]    Coagulation Studies: Recent Labs    09/29/20 1102  LABPROT 18.6*  INR 1.6*    Urinalysis: No results for input(s): COLORURINE, LABSPEC, PHURINE, GLUCOSEU, HGBUR, BILIRUBINUR, KETONESUR, PROTEINUR, UROBILINOGEN, NITRITE, LEUKOCYTESUR in the last 72 hours.  Invalid input(s): APPERANCEUR    Imaging: CT ABDOMEN PELVIS W CONTRAST  Result Date: 09/30/2020 CLINICAL DATA:  Unintentional weight loss EXAM: CT ABDOMEN AND PELVIS WITH CONTRAST TECHNIQUE: Multidetector CT imaging  of the abdomen and pelvis was performed using the standard protocol following bolus administration of intravenous contrast. CONTRAST:  60mL OMNIPAQUE IOHEXOL 300 MG/ML  SOLN COMPARISON:  None. FINDINGS: Lower chest: Complex moderate right pleural effusion is again identified with associated pleural thickening. Small focus of extrapleural gas within this collection likely relates to recent thoracentesis. There is associated compressive atelectasis of the right lung base. Partially loculated left pleural effusion largely within the left major fissure appears stable since prior CT examination of 09/29/2020. Dense pleural calcifications at the left lung base likely relate to remote trauma or inflammation. Mild global cardiomegaly with particular enlargement of the a right ventricle and right atrium are again identified. Pacemaker leads are seen within the right heart and left ventricular venous outflow. Extensive calcifications are noted within the coronary arteries. Hepatobiliary: Reflux of contrast into the hepatic venous system is in keeping with at least some degree of right heart failure. Tiny probable cyst within the inferior right hepatic lobe. Liver otherwise unremarkable. No intra or extrahepatic biliary  ductal dilation. Gallbladder unremarkable. Pancreas: Unremarkable Spleen: Unremarkable Adrenals/Urinary Tract: The adrenal glands are unremarkable. The kidneys are normal in position. There is mild-to-moderate bilateral renal cortical atrophy noted. Bilateral simple cortical cysts are identified. The kidneys are otherwise unremarkable. The bladder is unremarkable. Stomach/Bowel: Surgical changes of a partial gastrectomy are identified. Moderate sigmoid diverticulosis. The stomach, small bowel, and large bowel are otherwise unremarkable. No evidence of obstruction or focal inflammation. Appendix normal. No free intraperitoneal gas. Mild ascites is present. Vascular/Lymphatic: There is extensive aortoiliac  atherosclerotic calcification with particularly prominent atherosclerotic calcification at the origin of the a renal and mesenteric arterial vasculature almost certainly resulting in hemodynamically significant stenoses, not well characterized on this examination. Short segment focal dissection within the infrarenal abdominal aorta, likely the result of a remote penetrating atherosclerotic ulcer results in mild ectasia of the infrarenal abdominal aorta without frank aneurysm formation, with a maximal transaxial dimension of 2.6 cm. The common iliac arteries are ectatic bilaterally, left greater than right, without frank aneurysm formation. Extensive atherosclerotic calcification is noted within the lower extremity arterial inflow and visualized outflow. Reproductive: Prostate is unremarkable. Other: There is extensive subcutaneous edema noted throughout the body wall as well as retroperitoneal edema in keeping with changes of anasarca. Musculoskeletal: Degenerative changes are seen within the lumbar spine. No suspicious lytic or blastic bone lesions are seen. Degenerative changes are noted within the hips bilaterally. No acute bone abnormality. IMPRESSION: Complex right hydropneumothorax with small gaseous component likely related to recent thoracentesis. Cardiomegaly and extensive coronary artery calcification. Large mint of the right heart and reflux of contrast into the hepatic venous system in keeping with at least some degree of right heart failure. Dense calcification at the left lung base likely result of remote trauma or inflammation. Diffuse body wall subcutaneous edema, mild ascites, and retroperitoneal edema most in keeping with moderate anasarca, possibly the result of cardiogenic failure. Peripheral vascular disease with extensive atherosclerotic calcification at the origin of the mesenteric and renal vasculature almost certainly resulting in hemodynamically significant stenosis. Clinical correlation  for signs and symptoms of chronic mesenteric ischemia and/or significant renal artery stenosis is recommended. Aortic Atherosclerosis (ICD10-I70.0). Electronically Signed   By: Helyn Numbers MD   On: 09/30/2020 13:09   DG Chest Port 1 View  Result Date: 09/30/2020 CLINICAL DATA:  Status post right-sided thoracentesis. EXAM: PORTABLE CHEST 1 VIEW COMPARISON:  CT a chest in chest x-ray from yesterday. FINDINGS: Unchanged left chest wall pacemaker. Stable cardiomegaly and pulmonary artery enlargement. Loculated bilateral pleural effusions again noted, slightly decreased on the right status post thoracentesis. No pneumothorax. Unchanged pleural calcification at the lung bases and bilateral lower lobe atelectasis. No acute osseous abnormality. IMPRESSION: 1. Loculated bilateral pleural effusions, slightly decreased on the right status post thoracentesis. No pneumothorax. Electronically Signed   By: Obie Dredge M.D.   On: 09/30/2020 12:34   ECHOCARDIOGRAM COMPLETE  Result Date: 09/30/2020    ECHOCARDIOGRAM REPORT   Patient Name:   IRELAND CHAGNON Date of Exam: 09/30/2020 Medical Rec #:  [id number]       Height:       67.0 in Accession #:    [id number]      Weight:       88.3 lb Date of Birth:  [birth year]       BSA:          1.429 m Patient Age:    88 years        BP:  87/50 mmHg Patient Gender: M               HR:           76 bpm. Exam Location:  [facility] Procedure: 2D Echo, Color Doppler and Cardiac Doppler Indications:     R06.00 Dyspnea  History:         Patient has no prior history of Echocardiogram examinations.                  Arrythmias:Paroxymal atrial fibrillation,                  Signs/Symptoms:Shortness of Breath; Risk Factors:Hypertension.  Sonographer:     Humphrey Rolls RDCS (AE) Referring Phys:  [id number] AMY N COX Diagnosing Phys: Harold Hedge MD  Sonographer Comments: TDS due to bony thorax. IMPRESSIONS  1. Left ventricular ejection fraction, by estimation, is 65 to 70%. The left ventricle  has normal function. The left ventricle has no regional wall motion abnormalities. Left ventricular diastolic parameters were normal.  2. Right ventricular systolic function is normal. The right ventricular size is mildly enlarged.  3. Left atrial size was mildly dilated.  4. Right atrial size was mildly dilated.  5. The mitral valve is grossly normal. Mild to moderate mitral valve regurgitation.  6. The aortic valve is calcified. Aortic valve regurgitation is trivial. Mild to moderate aortic valve sclerosis/calcification is present, without any evidence of aortic stenosis. FINDINGS  Left Ventricle: Left ventricular ejection fraction, by estimation, is 65 to 70%. The left ventricle has normal function. The left ventricle has no regional wall motion abnormalities. The left ventricular internal cavity size was normal in size. There is  borderline left ventricular hypertrophy. Left ventricular diastolic parameters were normal. Right Ventricle: The right ventricular size is mildly enlarged. No increase in right ventricular wall thickness. Right ventricular systolic function is normal. Left Atrium: Left atrial size was mildly dilated. Right Atrium: Right atrial size was mildly dilated. Pericardium: There is no evidence of pericardial effusion. Mitral Valve: The mitral valve is grossly normal. Mild to moderate mitral valve regurgitation. MV peak gradient, 3.1 mmHg. The mean mitral valve gradient is 1.0 mmHg. Tricuspid Valve: The tricuspid valve is not well visualized. Tricuspid valve regurgitation is trivial. Aortic Valve: The aortic valve is calcified. Aortic valve regurgitation is trivial. Mild to moderate aortic valve sclerosis/calcification is present, without any evidence of aortic stenosis. Aortic valve mean gradient measures 3.0 mmHg. Aortic valve peak  gradient measures 5.9 mmHg. Aortic valve area, by VTI measures 1.71 cm. Pulmonic Valve: The pulmonic valve was not well visualized. Pulmonic valve regurgitation is  trivial. Aorta: The aortic root is normal in size and structure. IAS/Shunts: No atrial level shunt detected by color flow Doppler. Additional Comments: A pacer wire is visualized.  LEFT VENTRICLE PLAX 2D LVIDd:         3.50 cm LVIDs:         2.10 cm LV PW:         1.10 cm LV IVS:        0.80 cm LVOT diam:     2.20 cm LV SV:         38 LV SV Index:   26 LVOT Area:     3.80 cm  RIGHT VENTRICLE RV Basal diam:  4.80 cm LEFT ATRIUM             Index       RIGHT ATRIUM  Index LA diam:        4.10 cm 2.87 cm/m  RA Area:     30.00 cm LA Vol (A2C):   40.4 ml 28.28 ml/m RA Volume:   112.00 ml 78.39 ml/m LA Vol (A4C):   92.3 ml 64.61 ml/m LA Biplane Vol: 65.3 ml 45.71 ml/m  AORTIC VALVE                   PULMONIC VALVE AV Area (Vmax):    1.97 cm    PV Vmax:       0.68 m/s AV Area (Vmean):   1.92 cm    PV Vmean:      42.900 cm/s AV Area (VTI):     1.71 cm    PV VTI:        0.128 m AV Vmax:           121.00 cm/s PV Peak grad:  1.9 mmHg AV Vmean:          79.000 cm/s PV Mean grad:  1.0 mmHg AV VTI:            0.220 m AV Peak Grad:      5.9 mmHg AV Mean Grad:      3.0 mmHg LVOT Vmax:         62.70 cm/s LVOT Vmean:        40.000 cm/s LVOT VTI:          0.099 m LVOT/AV VTI ratio: 0.45  AORTA Ao Root diam: 3.50 cm MITRAL VALVE MV Area (PHT): 6.96 cm    SHUNTS MV Area VTI:   2.36 cm    Systemic VTI:  0.10 m MV Peak grad:  3.1 mmHg    Systemic Diam: 2.20 cm MV Mean grad:  1.0 mmHg MV Vmax:       0.88 m/s MV Vmean:      47.8 cm/s MV Decel Time: 109 msec MV E velocity: 75.80 cm/s MV A velocity: 36.80 cm/s MV E/A ratio:  2.06 Harold HedgeKenneth Fath MD Electronically signed by Harold HedgeKenneth Fath MD Signature Date/Time: 09/30/2020/11:36:34 AM    Final    US THORACENTESIS ASP PLEURAL SPACE W/IMG GUIDE  Result Date: 09/30/2020 INDICATION: Patient with history of pulmonary hypertension and persistent AFib. Found to have bilateral pleural effusions after presenting to the emergency department with shortness of breath. Team is requesting  therapeutic and diagnostic thoracentesis EXAM: ULTRASOUND GUIDED RIGHT-SIDED THERAPEUTIC AND DIAGNOSTIC THORACENTESIS MEDICATIONS: Lidocaine 1% 10 mL COMPLICATIONS: None immediate. PROCEDURE: An ultrasound guided thoracentesis was thoroughly discussed with the patient and questions answered. The benefits, risks, alternatives and complications were also discussed. The patient understands and wishes to proceed with the procedure. Written consent was obtained. Ultrasound was performed to localize and mark an adequate pocket of fluid in the right chest. The area was then prepped and draped in the normal sterile fashion. 1% Lidocaine was used for local anesthesia. Under ultrasound guidance a 6 Fr Safe-T-Centesis catheter was introduced. Thoracentesis was performed. The catheter was removed and a dressing applied. FINDINGS: A total of approximately 350 mL of amber colored fluid was removed. Samples were sent to the laboratory as requested by the clinical team. Patient unable to tolerate additional fluid removal and per patient request the procedure was terminated. IMPRESSION: Successful ultrasound guided therapeutic and diagnostic right-sided thoracentesis yielding 350 mL of pleural fluid. Read by: Anders GrantJennifer Omohundro, NP Electronically Signed   By: Simonne ComeJohn  Watts M.D.   On: 09/30/2020 12:54  Assessment & Plan: Mr. Ellard Nan is a 87 y.o. white male with pulmonary hypertension, essential hypertension, gout, hyperlipidemia, history of gastrectomy, pacemaker placement, atrial fibrillation, coronary artery disease, aortic dissection, PE/DVT, who was admitted to [facility] 09/[facility]/2020 for Shortness of breath [R06.02] Hyponatremia [E87.1] Pleural effusion [J90] Pleural effusion due to congestive heart failure (HCC) [I50.9] Acute respiratory failure with hypoxia (HCC) [J96.01]  1. Hyponatremia: acute on chronic. Baseline seems to be 126-135. With documented history of SIADH and history of severe  hyponatremia Chronic pulmonary effusions bilaterally.  Vaptans of little use with patient's with liver disease (alcoholic liver disease with elevated INR and thrombocytopenia) - Check serum albumin level - Fluid restriction - will hold furosemide due to hypotension - Continue normal saline infusion - monitor volume status and respiratory status.   2. Acute kidney injury: on chronic kidney disease stage IIIB with baseline creatinine of 1.48 in 06/22/2020.  CT contrast exposure on 2/10 No obstruction on CT abd/pelvis - NS at 29mL/hr  3. Anemia with kidney failure: hemoglobin 9. With thrombocytopenia. History of iron deficiency.   4. Hypotension: currently on atenolol and midodrine - midodrine started - discontinue atenolol  LOS: 2 Basil Blakesley 2/13/20229:19 AM

## 2020-10-02 NOTE — Progress Notes (Signed)
OT Cancellation Note  Patient Details Name: Patrick Mccormick MRN: [id number] DOB: March 04, 1932   Cancelled Treatment:    Reason Eval/Treat Not Completed: Patient declined, no reason specified. Orders received and chart reviewed. Upon arrival to room, pt in bed, with son at bedside, preparing to eat lunch. Pt c/o fatigue and declined therapy this date. OT provided education on the benefits of OOB mobility, with pt continuing to decline therapy. OT to re-attempt at later date/time.  Matthew Folks, OTR/L [facility] [phone]

## 2020-10-03 ENCOUNTER — Inpatient Hospital Stay: Payer: Medicare HMO

## 2020-10-03 DIAGNOSIS — R0603 Acute respiratory distress: Secondary | ICD-10-CM | POA: Diagnosis not present

## 2020-10-03 LAB — CBC
HCT: 27.4 % — ABNORMAL LOW (ref 39.0–52.0)
Hemoglobin: 9.6 g/dL — ABNORMAL LOW (ref 13.0–17.0)
MCH: 33.1 pg (ref 26.0–34.0)
MCHC: 35 g/dL (ref 30.0–36.0)
MCV: 94.5 fL (ref 80.0–100.0)
Platelets: 148 10*3/uL — ABNORMAL LOW (ref 150–400)
RBC: 2.9 MIL/uL — ABNORMAL LOW (ref 4.22–5.81)
RDW: 14.6 % (ref 11.5–15.5)
WBC: 7.7 10*3/uL (ref 4.0–10.5)
nRBC: 0 % (ref 0.0–0.2)

## 2020-10-03 LAB — GLUCOSE, PLEURAL OR PERITONEAL FLUID: Glucose, Fluid: 132 mg/dL

## 2020-10-03 LAB — COMPREHENSIVE METABOLIC PANEL
ALT: 13 U/L (ref 0–44)
AST: 25 U/L (ref 15–41)
Albumin: 3.3 g/dL — ABNORMAL LOW (ref 3.5–5.0)
Alkaline Phosphatase: 109 U/L (ref 38–126)
Anion gap: 9 (ref 5–15)
BUN: 70 mg/dL — ABNORMAL HIGH (ref 8–23)
CO2: 22 mmol/L (ref 22–32)
Calcium: 9.5 mg/dL (ref 8.9–10.3)
Chloride: 89 mmol/L — ABNORMAL LOW (ref 98–111)
Creatinine, Ser: 3.02 mg/dL — ABNORMAL HIGH (ref 0.61–1.24)
GFR, Estimated: 19 mL/min — ABNORMAL LOW (ref >60)
Glucose, Bld: 133 mg/dL — ABNORMAL HIGH (ref 70–99)
Potassium: 5.2 mmol/L — ABNORMAL HIGH (ref 3.5–5.1)
Sodium: 120 mmol/L — ABNORMAL LOW (ref 135–145)
Total Bilirubin: 0.6 mg/dL (ref 0.3–1.2)
Total Protein: 6.2 g/dL — ABNORMAL LOW (ref 6.5–8.1)

## 2020-10-03 LAB — BODY FLUID CULTURE
Culture: NO GROWTH
Gram Stain: NONE SEEN

## 2020-10-03 LAB — CYTOLOGY - NON PAP

## 2020-10-03 LAB — PH, BODY FLUID: pH, Body Fluid: 7.6

## 2020-10-03 LAB — LACTATE DEHYDROGENASE, PLEURAL OR PERITONEAL FLUID: LD, Fluid: 65 U/L — ABNORMAL HIGH (ref 3–23)

## 2020-10-03 MED ORDER — SODIUM ZIRCONIUM CYCLOSILICATE 10 G PO PACK
10.0000 g | PACK | Freq: Every day | ORAL | Status: DC
  Administered 2020-10-03: 10 g via ORAL
  Filled 2020-10-03 (×2): qty 1

## 2020-10-03 NOTE — Procedures (Signed)
Ultrasound-guided diagnostic and therapeutic right thoracentesis performed yielding 350 mililiters of amber colored fluid. No immediate complications.   Diagnostic fluid was sent to the lab for further analysis. Follow-up chest x-ray pending. EBL is < 2 ml.

## 2020-10-03 NOTE — Progress Notes (Signed)
PROGRESS NOTE    Patrick ScarletFred Mccormick  JWJ:[id number] DOB: 11-04-1931 DOA: 09/29/2020 PCP: Conan BowensWinslow, [place] R., MD   Brief Narrative: Taken from H&P. Patrick Mccormick is a 87 y.o. male with medical history significant for pulmonary hypertension, hypertension, history of SIADH, unintended weight loss, history of gastrectomy, persistent atrial fibrillation, iron deficiency anemia, presented to the emergency department for chief concerns of shortness of breath.   At baseline, patient is not on home oxygen.  He endorses shortness of breath x one week. He has unintentionally lost about 40 lbs in 1.5 years (150ish to 110) and extreme exhaustion and does not get hungry. He reports poor appetite.   Found to have bilateral pleural effusions, history of thoracentesis done twice. And hypoxic requiring supplemental oxygen.  Labs pertinent for hyponatremia with sodium of 122.  Patient recently moved from daughter home and most of his care was at [facility]. He wants to get established in [place] to avoid traveling.  Subjective: Patient appears more lethargic when seen today, stating that he could not sleep last night.  Denies any pain.  No appetite. Wife at bedside.  Assessment & Plan:   Principal Problem:   Respiratory distress Active Problems:   Pleural effusion due to congestive heart failure (HCC)   Pulmonary hypertension (HCC)   Atrial fibrillation, chronic (HCC)   History of partial gastrectomy   Protein-calorie malnutrition, severe  Acute hypoxic respiratory failure/bilateral pleural effusion/PE/cor pulmonale.  CTA positive for PE but this seems chronic.  PE involving left main pulmonary artery and extending into the lingular branches may reflect chronic pulmonary embolism with acute pulmonary embolism less favored however there was antidependent incomplete opacification of the left lower lobe pulmonary arteries could reflect laminar flow artifact however underlying pulmonary embolism cannot be  excluded there is evidence of right heart strain. Also had bilateral pleural effusions s/p right-sided thoracentesis with removal of 350 cc of fluid, further removal was discontinued at patient's request. History of recurrent pleural effusions and had thoracentesis twice in the past 2 years.  Questionable pleural calcification which will need further investigation. Currently saturating on room air. Echocardiogram with normal EF and pulmonary hypertension.  Elevated BNP. CT abdomen is concerning for anasarca and hepatic congestion more consistent with right-sided heart failure. Thoracentesis labs so far borderline exudative with lymphocytic predominant,  Cultures negative, cytology pending. -Increase the dose of Eliquis to full for treatment. -Pulmonary was consulted-appreciate their recommendations. -Patient was started on midodrine with Lasix by pulmonary, resulted in an increase in creatinine, Lasix was held and he was given some gentle IV fluid with worsening renal function. -Liver ultrasound Doppler-with increased liver echogenicity, no surface changes, dilated hepatic vein and IVC which were more consistent with right heart failure.  Most likely have cor pulmonale then. -Repeat chest x-ray with worsening right-sided pleural effusion. -Repeat thoracentesis ordered by pulmonology today. -Patient had cor pulmonale with right-sided heart failure. -Palliative care consult  Anasarca.  CT abdomen and pelvis with concern of anasarca and concern of right-sided heart failure.  Patient uses Lasix 20 mg twice daily. Repeat echocardiogram with normal EF, no wall motion abnormalities, normal biventricular systolic and diastolic function, no comment on pulmonary pressure.  Lasix with resulted increase in creatinine.  Function continued to get worse despite giving some IV fluid. -Encourage p.o. hydration as we would like to avoid volume overload. -Closely monitor volume status. -Daily BMP and  weight -Strict intake and output  Unintentional weight loss.  By looking at her providers notes in care everywhere it looks  like weight loss started after getting partial gastrectomy in May 2020 secondary to [facility] ulcer.  CT chest with some concern of calcifications at left lung base.  CT abdomen and pelvis was done today which was negative for any concerning mass but did show extensive atherosclerosis. TSH borderline high with normal free T4 and low T3.  Vitamin B12 elevated, copper levels pending which were checked as he has partial gastrectomy. -We will start him on Synthroid-will need a repeat check in 4 weeks and a close follow-up with PCP for optimization of dose. -Dietitian consult to improve nutrition. -We will need further work-up as an outpatient  AKI with CKD stage IIIb.  Worsening renal function not responding to IV fluid or diuresis.  Some evidence of anasarca on imaging but clinically does not appear that volume up.  Dilated hepatic vein and IVC on liver Dopplers. -Nephrology was consulted-they are concerned about multiorgan failure and he will not be a good candidate for dialysis. -Giving some gentle IV fluid -Continue to monitor  Hyponatremia.  Sodium remains at 120. Patient has an history of SIADH per chart review.  Sodium was within normal limit when last time checked in November 2021. Hyponatremia labs with low serum osmolality low serum osmolality and urinary sodium.  Some concern of secondary to liver disease as patient drinks alcohol regularly. -Nephrology was consulted-appreciate their help. -Monitor sodium   Severe protein caloric malnutrition.  Most likely secondary to partial gastrectomy. -Dietitian consult.  Pulmonary hypertension.  Patient followed up with pulmonary at [facility] and wants to establish locally. -Pulmonary consult -Pulmonary hold sildenafil.  Persistent atrial fibrillation/sick sinus s/p permanent pacemaker. Continue home dose of atenolol Increase  the dose of Eliquis from 2.5 to full dose for treatment of PE  Diarrhea.  Resolved.  Generalized weakness. PT is recommending SNF. -TOC for placement  Palliative care consult.  Patient with multiorgan failure, concern of hepatorenal and cardiorenal syndrome.  Worsening cor pulmonale with right heart failure although echo did not show any right ventricular dysfunction. Palliative care was consulted. Patient is very high risk for deterioration and death.  Objective: Vitals:   Oct 26, 2020 0431 October 26, 2020 0501 10-26-2020 0758 Oct 26, 2020 1114  BP: (!) 97/56  (!) 103/58 98/60  Pulse: (!) 59 60 60 (!) 59  Resp: Temp: 97.8 F (36.6 C)  97.6 F (36.4 C) 97.9 F (36.6 C)  TempSrc: Oral  Oral   SpO2: 100% 100% 100% 100%  Weight:  39.1 kg    Height:        Intake/Output Summary (Last 24 hours) at 10-26-2020 1520 Last data filed at 10/26/20 1013 Gross per 24 hour  Intake 1338.63 ml  Output 400 ml  Net 938.63 ml   Filed Weights   10/01/20 0413 10/02/20 0344 10-26-2020 0501  Weight: 38.8 kg 38.7 kg 39.1 kg    Examination:  General.  Chronically ill-appearing, cachectic elderly man, in no acute distress. Pulmonary.  Decreased breath sounds at bases, normal respiratory effort. CV.  Regular rate and rhythm, no JVD, rub or murmur. Abdomen.  Soft, nontender, nondistended, BS positive. CNS.  Alert and oriented x3.  No focal neurologic deficit. Extremities.  No edema, no cyanosis, pulses intact and symmetrical. Psychiatry.  Judgment and insight appears normal.  DVT prophylaxis: Eliquis Code Status: Partial Family Communication: Wife was updated at bedside Disposition Plan:  Status is: Inpatient  Remains inpatient appropriate because:Inpatient level of care appropriate due to severity of illness   Dispo: The patient is from:  Home              Anticipated d/c is to: SNF              Anticipated d/c date is: 2 days              Patient currently is not medically stable to d/c.    Difficult to place patient No              Level of care: Progressive Cardiac  Consultants:   Vascular surgery  Pulmonology  Nephrology  Procedures:  Antimicrobials:   Data Reviewed: I have personally reviewed following labs and imaging studies  CBC: Recent Labs  Lab 09/29/20 1102 09/30/20 0432 10/01/20 0432 10/02/20 0444 10/03/20 0409  WBC 4.0 3.8* 4.7 5.2 7.7  NEUTROABS 2.8  --   --   --   --   HGB 11.4* 10.2* 9.8* 9.0* 9.6*  HCT 33.1* 28.4* 28.8* 25.9* 27.4*  MCV 93.8 91.0 94.4 94.9 94.5  PLT 157 128* 130* 134* 148*   Basic Metabolic Panel: Recent Labs  Lab 09/30/20 1614 10/01/20 0432 10/01/20 1645 10/02/20 0444 10/03/20 0409  NA 120* 122* 121* 120* 120*  K 4.0 4.3 4.5 4.7 5.2*  CL 88* 89* 90* 89* 89*  CO2 22 24 23 24 22   GLUCOSE 104* 89 94 148* 133*  BUN 34* 40* 46* 51* 70*  CREATININE 1.24 1.71* 1.81* 2.28* 3.02*  CALCIUM 8.7* 9.0 9.0 9.1 9.5   GFR: Estimated Creatinine Clearance: 9.7 mL/min (A) (by C-G formula based on SCr of 3.02 mg/dL (H)). Liver Function Tests: Recent Labs  Lab 09/29/20 1102 10/02/20 0444 10/03/20 0409  AST 33 34 25  ALT 14 16 13   ALKPHOS 137* 105 109  BILITOT 1.5* 1.0 0.6  PROT 7.0 6.0* 6.2*  ALBUMIN 3.5 3.2* 3.3*   No results for input(s): LIPASE, AMYLASE in the last 168 hours. No results for input(s): AMMONIA in the last 168 hours. Coagulation Profile: Recent Labs  Lab 09/29/20 1102  INR 1.6*   Cardiac Enzymes: No results for input(s): CKTOTAL, CKMB, CKMBINDEX, TROPONINI in the last 168 hours. BNP (last 3 results) No results for input(s): PROBNP in the last 8760 hours. HbA1C: No results for input(s): HGBA1C in the last 72 hours. CBG: Recent Labs  Lab 09/30/20 0909 09/30/20 0957 09/30/20 1126 10/01/20 2123  GLUCAP 37* 119* 93 217*   Lipid Profile: No results for input(s): CHOL, HDL, LDLCALC, TRIG, CHOLHDL, LDLDIRECT in the last 72 hours. Thyroid Function Tests: No results for input(s): TSH, T4TOTAL,  FREET4, T3FREE, THYROIDAB in the last 72 hours. Anemia Panel: Recent Labs    10/01/20 0432  VITAMINB12 1,616*   Sepsis Labs: Recent Labs  Lab 09/29/20 1310 10/02/20 1816 10/02/20 2125  PROCALCITON 0.16  --   --   LATICACIDVEN  --  1.5 1.7    Recent Results (from the past 240 hour(s))  Resp Panel by RT-PCR (Flu A&B, Covid) Nasopharyngeal Swab     Status: None   Collection Time: 09/29/20 11:02 AM   Specimen: Nasopharyngeal Swab; Nasopharyngeal(NP) swabs in vial transport medium  Result Value Ref Range Status   SARS Coronavirus 2 by RT PCR NEGATIVE NEGATIVE Final    Comment: (NOTE) SARS-CoV-2 target nucleic acids are NOT DETECTED.  The SARS-CoV-2 RNA is generally detectable in upper respiratory specimens during the acute phase of infection. The lowest concentration of SARS-CoV-2 viral copies this assay can detect is 138 copies/mL. A negative result does not preclude SARS-Cov-2  infection and should not be used as the sole basis for treatment or other patient management decisions. A negative result may occur with  improper specimen collection/handling, submission of specimen other than nasopharyngeal swab, presence of viral mutation(s) within the areas targeted by this assay, and inadequate number of viral copies(<138 copies/mL). A negative result must be combined with clinical observations, patient history, and epidemiological information. The expected result is Negative.  Fact Sheet for Patients:  BloggerCourse.com  Fact Sheet for Healthcare Providers:  SeriousBroker.it  This test is no t yet approved or cleared by the [place] FDA and  has been authorized for detection and/or diagnosis of SARS-CoV-2 by FDA under an Emergency Use Authorization (EUA). This EUA will remain  in effect (meaning this test can be used) for the duration of the COVID-19 declaration under Section 564(b)(1) of the Act, 21 U.S.C.section  360bbb-3(b)(1), unless the authorization is terminated  or revoked sooner.       Influenza A by PCR NEGATIVE NEGATIVE Final   Influenza B by PCR NEGATIVE NEGATIVE Final    Comment: (NOTE) The Xpert Xpress SARS-CoV-2/FLU/RSV plus assay is intended as an aid in the diagnosis of influenza from Nasopharyngeal swab specimens and should not be used as a sole basis for treatment. Nasal washings and aspirates are unacceptable for Xpert Xpress SARS-CoV-2/FLU/RSV testing.  Fact Sheet for Patients: BloggerCourse.com  Fact Sheet for Healthcare Providers: SeriousBroker.it  This test is not yet approved or cleared by the [place] FDA and has been authorized for detection and/or diagnosis of SARS-CoV-2 by FDA under an Emergency Use Authorization (EUA). This EUA will remain in effect (meaning this test can be used) for the duration of the COVID-19 declaration under Section 564(b)(1) of the Act, 21 U.S.C. section 360bbb-3(b)(1), unless the authorization is terminated or revoked.  Performed at [facility], [address] [place], [place] [address]   MRSA PCR Screening     Status: None   Collection Time: 09/29/20  6:41 PM   Specimen: Nasopharyngeal  Result Value Ref Range Status   MRSA by PCR NEGATIVE NEGATIVE Final    Comment:        The GeneXpert MRSA Assay (FDA approved for NASAL specimens only), is one component of a comprehensive MRSA colonization surveillance program. It is not intended to diagnose MRSA infection nor to guide or monitor treatment for MRSA infections. Performed at [facility], [address] [place], [place] [address]   Body fluid culture     Status: None   Collection Time: 09/30/20 12:00 PM   Specimen: PATH Cytology Pleural fluid  Result Value Ref Range Status   Specimen Description   Final    PLEURAL Performed at [facility], [address] [place], [place] [address]     Special Requests   Final    NONE Performed at [facility], [address] [place], [place] [address]    Gram Stain NO WBC SEEN NO ORGANISMS SEEN   Final   Culture   Final    NO GROWTH 3 DAYS Performed at [facility], [address], [place], [place] [address]    Report Status 10/03/2020 FINAL  Final  Acid Fast Smear (AFB)     Status: None   Collection Time: 09/30/20 12:00 PM   Specimen: PATH Cytology Pleural fluid  Result Value Ref Range Status   AFB Specimen Processing Concentration  Final   Acid Fast Smear Negative  Final    Comment: (NOTE) Performed  At: [facility] [address] [place], [place] [id number] Jolene Schimke MD JG:[id number]    Source (AFB) PLEURAL  Final    Comment: Performed at [facility], [address] [place] [place], [place] [address]     Radiology Studies: DG Chest 2 View  Result Date: 10/03/2020 CLINICAL DATA:  Acute renal injury EXAM: CHEST - 2 VIEW COMPARISON:  10/02/2020 FINDINGS: LEFT-sided pacemaker overlies stable cardiac silhouette. Large bilateral loculated pleural effusions. The LEFT effusion appears slightly decreased. No pneumothorax. Calcified pleural plaque over the LEFT hemidiaphragm. IMPRESSION: 1. Bilateral large loculated pleural effusions. 2. LEFT effusion may be slightly reduced. 3. No pulmonary edema or pneumothorax. Electronically Signed   By: Genevive Bi M.D.   On: 10/03/2020 10:23   US RENAL  Result Date: 10/03/2020 CLINICAL DATA:  Acute kidney injury in a 87 year old male EXAM: RENAL / URINARY TRACT ULTRASOUND COMPLETE COMPARISON:  Abdomen and pelvis CT from 2022, February 2022 FINDINGS: Right Kidney: Renal measurements: 9.0 x 4.1 x 5.7 cm = volume: 110 mL. Slight increased echogenicity of renal cortex. Cyst in the interpolar aspect of the RIGHT kidney 2.5 x 2.4 x 1.4 cm. No hydronephrosis. Left Kidney: Renal measurements: 9.6 x 4.8 x 4.8 cm = volume: 114 mL. Mild increased echogenicity the  without hydronephrosis or focal lesion aside from a small cyst in the lower pole. Bladder: Under distended urinary bladder with small volume fluid in the pelvis, small volume fluid in the pelvis seen on prior imaging Other: Small volume ascites in the pelvis as discussed. IMPRESSION: Mild increased cortical echogenicity as can be seen in the setting of medical renal disease. No hydronephrosis. Small volume pelvic ascites. Electronically Signed   By: Donzetta Kohut M.D.   On: 10/03/2020 10:52   DG Chest Port 1 View  Result Date: 10/02/2020 CLINICAL DATA:  Fatigue EXAM: PORTABLE CHEST 1 VIEW COMPARISON:  09/30/2020 FINDINGS: There are persistent bilateral loculated pleural effusions which appear to have increased on the right but are stable on the left. Pleural base calcifications are again noted. There is no pneumothorax. The heart size is stable. Aortic calcifications are noted. There is a biventricular pacemaker in place. There is no acute osseous abnormality. IMPRESSION: Persistent bilateral loculated pleural effusions, increased on the right but stable on the left. Electronically Signed   By: Katherine Mantle M.D.   On: 10/02/2020 18:43   US LIVER DOPPLER  Result Date: 10/02/2020 CLINICAL DATA:  Hepatic cirrhosis EXAM: DUPLEX ULTRASOUND OF LIVER TECHNIQUE: Color and duplex Doppler ultrasound was performed to evaluate the hepatic in-flow and out-flow vessels. COMPARISON:  09/30/2020 CT with contrast FINDINGS: Liver: Increased hepatic echogenicity. No definite contour abnormality or significant surface nodularity. No focal lesion, mass or intrahepatic biliary ductal dilatation. Main Portal Vein size: 0.9 cm Portal Vein Velocities Main Prox:  21 cm/sec Main Mid: 40 cm/sec Main Dist:  36 cm/sec Right: 32 cm/sec Left: 24 cm/sec Hepatic Vein Velocities Right:  51 cm/sec Middle:  58 cm/sec Left:  51 cm/sec IVC: Present and patent with normal respiratory phasicity. Hepatic Artery Velocity:  71 cm/sec Splenic  Vein Velocity:  14 cm/sec Spleen: 4 cm x 4 cm x 6 cm with a total volume of 62 cm^3 (411 cm^3 is upper limit normal) Portal Vein Occlusion/Thrombus: No Splenic Vein Occlusion/Thrombus: No Ascites: None Varices: None IMPRESSION: Hepatic veins and IVC are dilated suggesting component of right heart failure. Portal, hepatic and splenic veins are all patent with normal directional flow. No veno-occlusive process. Electronically Signed  By: Osvaldo Shipper M.D.   On: 10/02/2020 11:19    Scheduled Meds: . allopurinol  100 mg Oral Daily  . apixaban  10 mg Oral BID   Followed by  . [START ON 10/07/2020] apixaban  5 mg Oral BID  . vitamin C  250 mg Oral BID  . atorvastatin  20 mg Oral Daily  . feeding supplement  237 mL Oral TID BM  . hydrocortisone sod succinate (SOLU-CORTEF) inj  50 mg Intravenous Q6H  . levothyroxine  25 mcg Oral Q0600  . midodrine  10 mg Oral QID  . mirtazapine  7.5 mg Oral QHS  . multivitamin with minerals  1 tablet Oral Daily  . sodium zirconium cyclosilicate  10 g Oral Daily   Continuous Infusions:    LOS: 3 days   Time spent: 35 minutes.  [name], MD [facility]  If 7PM-7AM, please contact night-coverage [email or web address]  10/03/2020, 3:20 PM   This record has been created using Conservation officer, historic buildings. Errors have been sought and corrected,but may not always be located. Such creation errors do not reflect on the standard of care.

## 2020-10-03 NOTE — Progress Notes (Signed)
I have personally reviewed the patient's CT angio of the chest.  Clearly with respect to pulmonary compromise is massive bilateral pleural effusions are much greater issue.  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  Also it appears to be a weblike lesion which is nonocclusive and does not appear to be flow-limiting.  Given the chronic nature as well as the nonocclusive aspect I do not believe that pulmonary thrombectomy would be of great benefit.  Thank you for asking [place] to see the patient and allowing [place] to participate in his care.  We can reassess his pulmonary arteries if needed.

## 2020-10-03 NOTE — Progress Notes (Signed)
[facility] [place], [place] 10/03/20  Subjective:   Patrick Mccormick is a 87 y.o. male with PMHX of pulmonary HTN, HTN, hx of SIADH, persistent atrial fibrillation, iron deficiency anemia and gastrectomy. He presented to the ED with complaints of shortness of breath.   He was admitted with respiratory failure including pleural effusion vs pulmonary embolism vs heart failure exacerbation.   Patient is seen today resting in bed, chronically ill-appearing Currently on 2L O2 Denies current shortness of breath and chest pain.  He is arouseable and able to open his eyes to verbal stimuli for short periods His wife is at the bedside   Objective:  Vital signs in last 24 hours:  Temp:  [97.6 F (36.4 C)-98.4 F (36.9 C)] 97.9 F (36.6 C) (02/14 1114) Pulse Rate:  [59-83] 59 (02/14 1114) Resp:  [17-20] 18 (02/14 1114) BP: (83-103)/(54-62) 98/60 (02/14 1114) SpO2:  [96 %-100 %] 100 % (02/14 1114) Weight:  [39.1 kg] 39.1 kg (02/14 0501)  Weight change: 0.445 kg Filed Weights   10/01/20 0413 10/02/20 0344 10/03/20 0501  Weight: 38.8 kg 38.7 kg 39.1 kg    Intake/Output:    Intake/Output Summary (Last 24 hours) at 10/03/2020 1534 Last data filed at 10/03/2020 1013 Gross per 24 hour  Intake 1338.63 ml  Output 400 ml  Net 938.63 ml   Physical Exam: General: Cachectic. Laying in bed  Head: Normocephalic, atraumatic. Dry oral mucosal membranes  Eyes: Anicteric  Neck: Supple, trachea midline  Lungs:  Bilateral rhonchi and +wheezes  On 2 L [place] O2  Heart: Regular rate and rhythm  Abdomen:  Soft, nontender,   Extremities: no peripheral edema.  Neurologic: Nonfocal, moving all four extremities  Skin: No lesions    Basic Metabolic Panel:  Recent Labs  Lab 09/30/20 1614 10/01/20 0432 10/01/20 1645 10/02/20 0444 10/03/20 0409  NA 120* 122* 121* 120* 120*  K 4.0 4.3 4.5 4.7 5.2*  CL 88* 89* 90* 89* 89*  CO2 22 24 23 24 22   GLUCOSE 104* 89 94 148* 133*  BUN  34* 40* 46* 51* 70*  CREATININE 1.24 1.71* 1.81* 2.28* 3.02*  CALCIUM 8.7* 9.0 9.0 9.1 9.5     CBC: Recent Labs  Lab 09/29/20 1102 09/30/20 0432 10/01/20 0432 10/02/20 0444 10/03/20 0409  WBC 4.0 3.8* 4.7 5.2 7.7  NEUTROABS 2.8  --   --   --   --   HGB 11.4* 10.2* 9.8* 9.0* 9.6*  HCT 33.1* 28.4* 28.8* 25.9* 27.4*  MCV 93.8 91.0 94.4 94.9 94.5  PLT 157 128* 130* 134* 148*     No results found for: HEPBSAG, HEPBSAB, HEPBIGM    Microbiology:  Recent Results (from the past 240 hour(s))  Resp Panel by RT-PCR (Flu A&B, Covid) Nasopharyngeal Swab     Status: None   Collection Time: 09/29/20 11:02 AM   Specimen: Nasopharyngeal Swab; Nasopharyngeal(NP) swabs in vial transport medium  Result Value Ref Range Status   SARS Coronavirus 2 by RT PCR NEGATIVE NEGATIVE Final    Comment: (NOTE) SARS-CoV-2 target nucleic acids are NOT DETECTED.  The SARS-CoV-2 RNA is generally detectable in upper respiratory specimens during the acute phase of infection. The lowest concentration of SARS-CoV-2 viral copies this assay can detect is 138 copies/mL. A negative result does not preclude SARS-Cov-2 infection and should not be used as the sole basis for treatment or other patient management decisions. A negative result may occur with  improper specimen collection/handling, submission of specimen other than nasopharyngeal  swab, presence of viral mutation(s) within the areas targeted by this assay, and inadequate number of viral copies(<138 copies/mL). A negative result must be combined with clinical observations, patient history, and epidemiological information. The expected result is Negative.  Fact Sheet for Patients:  BloggerCourse.com  Fact Sheet for Healthcare Providers:  SeriousBroker.it  This test is no t yet approved or cleared by the [place] FDA and  has been authorized for detection and/or diagnosis of SARS-CoV-2 by FDA  under an Emergency Use Authorization (EUA). This EUA will remain  in effect (meaning this test can be used) for the duration of the COVID-19 declaration under Section 564(b)(1) of the Act, 21 U.S.C.section 360bbb-3(b)(1), unless the authorization is terminated  or revoked sooner.       Influenza A by PCR NEGATIVE NEGATIVE Final   Influenza B by PCR NEGATIVE NEGATIVE Final    Comment: (NOTE) The Xpert Xpress SARS-CoV-2/FLU/RSV plus assay is intended as an aid in the diagnosis of influenza from Nasopharyngeal swab specimens and should not be used as a sole basis for treatment. Nasal washings and aspirates are unacceptable for Xpert Xpress SARS-CoV-2/FLU/RSV testing.  Fact Sheet for Patients: BloggerCourse.com  Fact Sheet for Healthcare Providers: SeriousBroker.it  This test is not yet approved or cleared by the [place] FDA and has been authorized for detection and/or diagnosis of SARS-CoV-2 by FDA under an Emergency Use Authorization (EUA). This EUA will remain in effect (meaning this test can be used) for the duration of the COVID-19 declaration under Section 564(b)(1) of the Act, 21 U.S.C. section 360bbb-3(b)(1), unless the authorization is terminated or revoked.  Performed at [facility], [address] [place], [place] [address]   MRSA PCR Screening     Status: None   Collection Time: 09/29/20  6:41 PM   Specimen: Nasopharyngeal  Result Value Ref Range Status   MRSA by PCR NEGATIVE NEGATIVE Final    Comment:        The GeneXpert MRSA Assay (FDA approved for NASAL specimens only), is one component of a comprehensive MRSA colonization surveillance program. It is not intended to diagnose MRSA infection nor to guide or monitor treatment for MRSA infections. Performed at [facility], [address] [place], [place] [address]   Body fluid culture     Status: None   Collection Time: 09/30/20  12:00 PM   Specimen: PATH Cytology Pleural fluid  Result Value Ref Range Status   Specimen Description   Final    PLEURAL Performed at [facility], [address], [place], [place] [address]    Special Requests   Final    NONE Performed at [facility], [address] [place], [place] [address]    Gram Stain NO WBC SEEN NO ORGANISMS SEEN   Final   Culture   Final    NO GROWTH 3 DAYS Performed at [facility], [address], [place], [place] [address]    Report Status 10/03/2020 FINAL  Final  Acid Fast Smear (AFB)     Status: None   Collection Time: 09/30/20 12:00 PM   Specimen: PATH Cytology Pleural fluid  Result Value Ref Range Status   AFB Specimen Processing Concentration  Final   Acid Fast Smear Negative  Final    Comment: (NOTE) Performed At: [facility] [address] [place], [place] [id number] Jolene Schimke MD YY:[id number]    Source (AFB) PLEURAL  Final    Comment: Performed at [facility], [address] [place], [place] [address]    Coagulation Studies: No results for input(s): LABPROT, INR in the last 72 hours.  Urinalysis: No results for input(s): COLORURINE, LABSPEC, PHURINE, GLUCOSEU, HGBUR, BILIRUBINUR, KETONESUR, PROTEINUR, UROBILINOGEN, NITRITE, LEUKOCYTESUR in the last 72 hours.  Invalid input(s): APPERANCEUR    Imaging: DG Chest 2 View  Result Date: 10/03/2020 CLINICAL DATA:  Acute renal injury EXAM: CHEST - 2 VIEW COMPARISON:  10/02/2020 FINDINGS: LEFT-sided pacemaker overlies stable cardiac silhouette. Large bilateral loculated pleural effusions. The LEFT effusion appears slightly decreased. No pneumothorax. Calcified pleural plaque over the LEFT hemidiaphragm. IMPRESSION: 1. Bilateral large loculated pleural effusions. 2. LEFT effusion may be slightly reduced. 3. No pulmonary edema or pneumothorax. Electronically Signed   By: Genevive Bi M.D.   On: 10/03/2020 10:23   US RENAL  Result Date:  10/03/2020 CLINICAL DATA:  Acute kidney injury in a 87 year old male EXAM: RENAL / URINARY TRACT ULTRASOUND COMPLETE COMPARISON:  Abdomen and pelvis CT from 2022, February 2022 FINDINGS: Right Kidney: Renal measurements: 9.0 x 4.1 x 5.7 cm = volume: 110 mL. Slight increased echogenicity of renal cortex. Cyst in the interpolar aspect of the RIGHT kidney 2.5 x 2.4 x 1.4 cm. No hydronephrosis. Left Kidney: Renal measurements: 9.6 x 4.8 x 4.8 cm = volume: 114 mL. Mild increased echogenicity the without hydronephrosis or focal lesion aside from a small cyst in the lower pole. Bladder: Under distended urinary bladder with small volume fluid in the pelvis, small volume fluid in the pelvis seen on prior imaging Other: Small volume ascites in the pelvis as discussed. IMPRESSION: Mild increased cortical echogenicity as can be seen in the setting of medical renal disease. No hydronephrosis. Small volume pelvic ascites. Electronically Signed   By: Donzetta Kohut M.D.   On: 10/03/2020 10:52   DG Chest Port 1 View  Result Date: 10/02/2020 CLINICAL DATA:  Fatigue EXAM: PORTABLE CHEST 1 VIEW COMPARISON:  09/30/2020 FINDINGS: There are persistent bilateral loculated pleural effusions which appear to have increased on the right but are stable on the left. Pleural base calcifications are again noted. There is no pneumothorax. The heart size is stable. Aortic calcifications are noted. There is a biventricular pacemaker in place. There is no acute osseous abnormality. IMPRESSION: Persistent bilateral loculated pleural effusions, increased on the right but stable on the left. Electronically Signed   By: Katherine Mantle M.D.   On: 10/02/2020 18:43   US LIVER DOPPLER  Result Date: 10/02/2020 CLINICAL DATA:  Hepatic cirrhosis EXAM: DUPLEX ULTRASOUND OF LIVER TECHNIQUE: Color and duplex Doppler ultrasound was performed to evaluate the hepatic in-flow and out-flow vessels. COMPARISON:  09/30/2020 CT with contrast FINDINGS: Liver:  Increased hepatic echogenicity. No definite contour abnormality or significant surface nodularity. No focal lesion, mass or intrahepatic biliary ductal dilatation. Main Portal Vein size: 0.9 cm Portal Vein Velocities Main Prox:  21 cm/sec Main Mid: 40 cm/sec Main Dist:  36 cm/sec Right: 32 cm/sec Left: 24 cm/sec Hepatic Vein Velocities Right:  51 cm/sec Middle:  58 cm/sec Left:  51 cm/sec IVC: Present and patent with normal respiratory phasicity. Hepatic Artery Velocity:  71 cm/sec Splenic Vein Velocity:  14 cm/sec Spleen: 4 cm x 4 cm x 6 cm with a total volume of 62 cm^3 (411 cm^3 is upper limit normal) Portal Vein Occlusion/Thrombus: No Splenic Vein Occlusion/Thrombus: No Ascites: None Varices: None IMPRESSION: Hepatic veins and IVC are dilated suggesting component of right heart failure. Portal, hepatic and splenic veins are all patent with normal directional flow.  No veno-occlusive process. Electronically Signed   By: Judie Petit.  Shick M.D.   On: 10/02/2020 11:19     Medications:    . allopurinol  100 mg Oral Daily  . apixaban  10 mg Oral BID   Followed by  . [START ON 10/07/2020] apixaban  5 mg Oral BID  . vitamin C  250 mg Oral BID  . atorvastatin  20 mg Oral Daily  . feeding supplement  237 mL Oral TID BM  . hydrocortisone sod succinate (SOLU-CORTEF) inj  50 mg Intravenous Q6H  . levothyroxine  25 mcg Oral Q0600  . midodrine  10 mg Oral QID  . mirtazapine  7.5 mg Oral QHS  . multivitamin with minerals  1 tablet Oral Daily  . sodium zirconium cyclosilicate  10 g Oral Daily   metoprolol tartrate, ondansetron **OR** ondansetron (ZOFRAN) IV, sodium chloride  Assessment/ Plan:  87 y.o. male with PMHX of pulmonary HTN, HTN, hx of SIADH, persistent atrial fibrillation, iron deficiency anemia and gastrectomy. was admitted on 09/29/2020   Principal Problem:   Respiratory distress Active Problems:   Pleural effusion due to congestive heart failure (HCC)   Pulmonary hypertension (HCC)   Atrial  fibrillation, chronic (HCC)   History of partial gastrectomy   Protein-calorie malnutrition, severe  Shortness of breath [R06.02] Hyponatremia [E87.1] Pleural effusion [J90] Pleural effusion due to congestive heart failure (HCC) [I50.9] Acute respiratory failure with hypoxia (HCC) [J96.01]  #. Hyponatremia -Baseline appears to be 126-135 -positive history of SIADH and hyponatremia -Current Na level-120 -Albumin -3.3 -Continue 1.2L fluid restriction and hold diuretics -Lokelma ordered daily -Renal US-mildly increased cortical echgenicity   #. Anemia of CKD  Lab Results  Component Value Date   HGB 9.6 (L) 10/03/2020   Hgb improved to 9.6 from 9.0.  Will continue to monitor  #. Acute kidney injury on CKD stage 3B -baseline creatinine 1.48 on 06/22/20. -No obstruction on CT abd/pelvis  # Hypotension Midodrine started on 10/01/20 BP stable 90s/50s to 100s/60s   LOS: 3 Wendee Beavers, NP 2/14/20223:34 PM  [facility] [place], [place] [phone]

## 2020-10-03 NOTE — Plan of Care (Signed)
Pt Axox4. Calm and cooperative and able to voice his needs. Pt currently on 1 L [place]. O2 sat 100%. This RN found pt laying in bed with small amount of blood on pillow and nose packed with small piece of paper towel. Pt reports putting his finger up to his nose and he started bleeding. Bleeding had stopped by the time this RN in the room. Vitals being monitored closely. Safety measures in place. Will continue to monitor.  Problem: Education: Goal: Knowledge of General Education information will improve Description: Including pain rating scale, medication(s)/side effects and non-pharmacologic comfort measures Outcome: Progressing   Problem: Health Behavior/Discharge Planning: Goal: Ability to manage health-related needs will improve Outcome: Progressing   Problem: Clinical Measurements: Goal: Ability to maintain clinical measurements within normal limits will improve Outcome: Progressing Goal: Will remain free from infection Outcome: Progressing Goal: Diagnostic test results will improve Outcome: Progressing Goal: Respiratory complications will improve Outcome: Progressing Goal: Cardiovascular complication will be avoided Outcome: Progressing   Problem: Activity: Goal: Risk for activity intolerance will decrease Outcome: Progressing   Problem: Activity: Goal: Risk for activity intolerance will decrease Outcome: Progressing   Problem: Nutrition: Goal: Adequate nutrition will be maintained Outcome: Progressing   Problem: Coping: Goal: Level of anxiety will decrease Outcome: Progressing   Problem: Elimination: Goal: Will not experience complications related to bowel motility Outcome: Progressing Goal: Will not experience complications related to urinary retention Outcome: Progressing   Problem: Pain Managment: Goal: General experience of comfort will improve Outcome: Progressing   Problem: Safety: Goal: Ability to remain free from injury will improve Outcome: Progressing    Problem: Skin Integrity: Goal: Risk for impaired skin integrity will decrease Outcome: Progressing

## 2020-10-03 NOTE — Care Management Important Message (Signed)
Important Message  Patient Details  Name: Patrick Mccormick MRN: [id number] Date of Birth: 09-10-1931   Medicare Important Message Given:  Yes     [name] 10/03/2020, 12:10 PM

## 2020-10-03 NOTE — Progress Notes (Signed)
OT Cancellation Note  Patient Details Name: Patrick Mccormick MRN: [id number] DOB: 1931-12-04   Cancelled Treatment:    Reason Eval/Treat Not Completed: Medical issues which prohibited therapy. Chart reviewed - pt noted to have K+ critically high at 5.2, Na+ critically low at 120; contraindicated for exertional activity at this time. Will continue to follow and initiate services as pt medically appropriate to participate in therapy.   Kathie Dike, M.S. OTR/L  10/03/20, 9:18 AM  ascom [phone]

## 2020-10-03 NOTE — Progress Notes (Addendum)
PT Cancellation Note  Patient Details Name: Patrick Mccormick MRN: [id number] DOB: January 30, 1932   Cancelled Treatment:    Reason Eval/Treat Not Completed: Medical issues which prohibited therapy. Patient with high potassium level, sodium of 120, palliative consult pending. PT will hold on exertional activity today and follow up as appropriate.   Donna Bernard, PT, MPT  Ina Homes 10/03/2020, 5:07 PM

## 2020-10-03 NOTE — Progress Notes (Signed)
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  Admits to wt loss>40lbs.  He further endorses new watery diarrhea, three times per day for one week. He denies blood. He endorses history of persistent right pleural effusion status post thoracentesis in which he had about two liters removed from only his right lungs about two years ago.  : He is married and lives with his spouse of 50 years. 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 He has never been a tobacco user. He drinks about two glasses of wine per night. PCCM consultation for pulmnary hypertension.     Patient had thoracentesis donex2 in 2020 and "was trying to control that",  He had one thoracentesis after admission here at [facility] and was able to get 300cc thoracentesis done but asked for procedure to be stopped half way throughout.    [name] is Patrick Mccormick is present today. She shares he is a chronic drinker of alcohol 1 day prior to admission he drank 3 glasses of wine.  He had TTE which was essentially normal.   -10/02/20-patient weaned to 2L/min [place].  He had episodes of hypotension despite midordrine.  He developed worsening aki overnight and renal consultation has been placed.  He has not diuresed well.  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  Vascular surgery has been  consulted.  Na levels are still low 120.  He remains without leukocytosis or fevers. Patient has been on lasix , he has IV fluids ordered today, I can dc lasix for now. Will repeat CXR today.  If patient is not surgical candidate he will need riociguat for CTEPH.   10/03/2020- patient is stable this am.  I called his son and reviewed hospital course and differential. He has significant aki and we reviewed case today.   PAST MEDICAL HISTORY   History reviewed. No pertinent past medical history.   SURGICAL HISTORY   History reviewed. No pertinent surgical history.   FAMILY HISTORY   History reviewed. No pertinent family history.   SOCIAL HISTORY   Social History   Tobacco Use  . Smoking status: Never Smoker  . Smokeless tobacco: Never Used     MEDICATIONS    Home Medication:    Current Medication:  Current Facility-Administered Medications:  .  allopurinol (ZYLOPRIM) tablet 100 mg, 100 mg, Oral, Daily, Cox, Amy N, DO, 100 mg at 10/03/20 0857 .  apixaban (ELIQUIS) tablet 10 mg, 10 mg, Oral, BID, 10 mg at 10/03/20 0857 **FOLLOWED BY** [START ON 10/07/2020] apixaban (ELIQUIS) tablet 5 mg, 5 mg, Oral, BID, [name], [name], MD .  ascorbic acid (VITAMIN C) tablet 250 mg, 250 mg, Oral, BID, [name], MD, 250 mg at 10/03/20 0857 .  atorvastatin (LIPITOR) tablet 20 mg, 20  mg, Oral, Daily, Cox, Amy N, DO, 20 mg at 10/03/20 0857 .  feeding supplement (ENSURE ENLIVE / ENSURE PLUS) liquid 237 mL, 237 mL, Oral, TID BM, [name], [name], MD, 237 mL at 10/03/20 0859 .  hydrocortisone sodium succinate (SOLU-CORTEF) 100 MG injection 50 mg, 50 mg, Intravenous, Q6H, Karna ChristmasAleskerov, Atira Borello, MD, 50 mg at 10/03/20 0857 .  levothyroxine (SYNTHROID) tablet 25 mcg, 25 mcg, Oral, Q0600, Arnetha CourserAmin, [name], MD, 25 mcg at 10/03/20 0431 .  metoprolol tartrate (LOPRESSOR) injection 5 mg, 5 mg, Intravenous, Q4H PRN, Cox, Amy N, DO .  midodrine (PROAMATINE) tablet 10 mg, 10 mg, Oral, QID, Manuela SchwartzMorrison, Brenda, NP, 10 mg at  10/03/20 0857 .  mirtazapine (REMERON) tablet 7.5 mg, 7.5 mg, Oral, QHS, Cox, Amy N, DO, 7.5 mg at 10/02/20 2123 .  multivitamin with minerals tablet 1 tablet, 1 tablet, Oral, Daily, Arnetha CourserAmin, [name], MD, 1 tablet at 10/03/20 0857 .  ondansetron (ZOFRAN) tablet 4 mg, 4 mg, Oral, Q6H PRN **OR** ondansetron (ZOFRAN) injection 4 mg, 4 mg, Intravenous, Q6H PRN, Cox, Amy N, DO .  sildenafil (REVATIO) tablet 20 mg, 20 mg, Oral, TID, Cox, Amy N, DO, 20 mg at 10/03/20 0857 .  sodium chloride (OCEAN) 0.65 % nasal spray 1 spray, 1 spray, Each Nare, PRN, Arnetha CourserAmin, [name], MD .  sodium zirconium cyclosilicate (LOKELMA) packet 10 g, 10 g, Oral, Daily, Arnetha CourserAmin, [name], MD, 10 g at 10/03/20 1115    ALLERGIES   Patient has no known allergies.     REVIEW OF SYSTEMS    Review of Systems:  Gen:  Denies  fever, sweats, chills weigh loss  HEENT: Denies blurred vision, double vision, ear pain, eye pain, hearing loss, nose bleeds, sore throat Cardiac:  No dizziness, chest pain or heaviness, chest tightness,edema Resp:   Denies cough or sputum porduction, shortness of breath,wheezing, hemoptysis,  Gi: Denies swallowing difficulty, stomach pain, nausea or vomiting, diarrhea, constipation, bowel incontinence Gu:  Denies bladder incontinence, burning urine Ext:   Denies Joint pain, stiffness or swelling Skin: Denies  skin rash, easy bruising or bleeding or hives Endoc:  Denies polyuria, polydipsia , polyphagia or weight change Psych:   Denies depression, insomnia or hallucinations   Other:  All other systems negative   VS: BP 98/60 (BP Location: Left Arm)   Pulse (!) 59   Temp 97.9 F (36.6 C)   Resp 18   Ht 5\' 7"  (1.702 m)   Wt 39.1 kg   SpO2 100%   BMI 13.52 kg/m      PHYSICAL EXAM    GENERAL:NAD, no fevers, chills, no weakness no fatigue HEAD: Normocephalic, atraumatic.  EYES: Pupils equal, round, reactive to light. Extraocular muscles intact. No scleral icterus.  MOUTH: Moist mucosal  membrane. Dentition intact. No abscess noted.  EAR, NOSE, THROAT: Clear without exudates. No external lesions.  NECK: Supple. No thyromegaly. No nodules. No JVD.  PULMONARY: Decreased breath sounds bilaterally  CARDIOVASCULAR: S1 and S2. Regular rate and rhythm. No murmurs, rubs, or gallops. No edema. Pedal pulses 2+ bilaterally.  GASTROINTESTINAL: Soft, nontender, nondistended. No masses. Positive bowel sounds. No hepatosplenomegaly.  MUSCULOSKELETAL: No swelling, clubbing, or edema. Range of motion full in all extremities.  NEUROLOGIC: Cranial nerves II through XII are intact. No gross focal neurological deficits. Sensation intact. Reflexes intact.  SKIN: No ulceration, lesions, rashes, or cyanosis. Skin warm and dry. Turgor intact.  PSYCHIATRIC: Mood, affect within normal limits. The patient is awake, alert and oriented x 3. Insight, judgment intact.  IMAGING    DG Chest 2 View  Result Date: 10/03/2020 CLINICAL DATA:  Acute renal injury EXAM: CHEST - 2 VIEW COMPARISON:  10/02/2020 FINDINGS: LEFT-sided pacemaker overlies stable cardiac silhouette. Large bilateral loculated pleural effusions. The LEFT effusion appears slightly decreased. No pneumothorax. Calcified pleural plaque over the LEFT hemidiaphragm. IMPRESSION: 1. Bilateral large loculated pleural effusions. 2. LEFT effusion may be slightly reduced. 3. No pulmonary edema or pneumothorax. Electronically Signed   By: Genevive Bi M.D.   On: 10/03/2020 10:23   CT Angio Chest PE W and/or Wo Contrast  Addendum Date: 09/29/2020   ADDENDUM REPORT: 09/29/2020 15:35 ADDENDUM: These results were called by telephone at the time of interpretation on 09/29/2020 at 3:35 pm to provider AMY COX , who verbally acknowledged these results. Electronically Signed   By: Kreg Shropshire M.D.   On: 09/29/2020 15:35   Result Date: 09/29/2020 CLINICAL DATA:  Dyspnea EXAM: CT ANGIOGRAPHY CHEST WITH CONTRAST TECHNIQUE: Multidetector CT imaging of the  chest was performed using the standard protocol during bolus administration of intravenous contrast. Multiplanar CT image reconstructions and MIPs were obtained to evaluate the vascular anatomy. CONTRAST:  75mL OMNIPAQUE IOHEXOL 350 MG/ML SOLN COMPARISON:  Radiograph 09/29/2020 FINDINGS: Cardiovascular: Satisfactory opacification of pulmonary arteries. Some thin linear web-like bands of hypoattenuation in the distal left main pulmonary artery and lingular branches. Some more anti dependent hypoattenuation within the segmental pulmonary arteries of the left lower lobe have an appearance favored to be laminar flow and mixing artifact though a more subacute to remote pulmonary embolism is not fully excluded. Central pulmonary arteries are enlarged. There is marked cardiomegaly with four-chamber enlargement. Three-vessel coronary artery atherosclerosis is present. Left chest wall pacer pack is noted with leads at the right atrium, coronary sinus and cardiac apex. Trace pericardial effusion. Suboptimal opacification of the aorta for luminal assessment. Atherosclerotic plaque within the normal caliber aorta. Normal 3 vessel branching of the aortic arch. Reflux of contrast in the hepatic veins and IVC. Mediastinum/Nodes: Diffuse edematous changes throughout the mediastinum. Scattered low-attenuation mediastinal and hilar lymph nodes favored to be reactive/edematous. No concerning axillary adenopathy. Fluid-filled, patulous thoracic esophagus. Trachea is unremarkable. No concerning thyroid abnormality though poorly evaluated with superimposed streak artifact. Lungs/Pleura: Complex, bilateral loculated pleural effusions which are predominantly low-attenuation. There are regions of dense basilar pleural calcification in both lungs. A more diffuse pleural thickening is noted in the right lung as well. There are areas of adjacent passive atelectasis with additional more focal nodular atelectatic changes in the right lung  (6/64). Could reflect some rounded atelectasis though underlying infection or disease is not fully excluded. Additional areas of bandlike reticular opacity with architectural distortion and scarring. Diffuse airways thickening and scattered secretions are noted as well. No pneumothorax. Upper Abdomen: Diffuse edematous changes noted throughout the upper abdomen and mesentery with small volume ascites. Extensive atherosclerotic plaque in the upper abdominal aorta. Calcified septation in the infrarenal abdominal aorta may reflect sequela of prior dissection or lamellar calcification of the aorta itself, incompletely assessed on this exam. Postsurgical changes from prior gastric surgery. Musculoskeletal: The osseous structures appear diffusely demineralized which may limit detection of small or nondisplaced fractures. No acute osseous abnormality or suspicious osseous lesion. Degenerative changes are present in the imaged spine and shoulders. Diffuse body wall edema. Review of the MIP images confirms the above findings. IMPRESSION: 1. Web-like filling defects in the distal left main pulmonary artery and extending into the lingular branches may reflect subacute to chronic pulmonary embolism with  acute pulmonary embolism less favored. More antidependent incomplete opacification of the left lower lobe pulmonary arteries could reflect laminar flow artifact though underlying embolism is not excluded. Evaluation for right heart strain is precluded in the setting of diffuse cardiomegaly and four-chamber cardiac enlargement though there is evidence of elevated right heart pressures and right-sided dysfunction with enlarged pulmonary arteries and refluxing contrast into the hepatic veins and IVC. Consider further characterization with echocardiography as clinically warranted. 2. Complex, bilateral loculated pleural effusions which are predominantly low-attenuation. Associated regions of dense basilar pleural calcification in  both lungs. Findings can be on the spectrum of underlying asbestos related pleural disease though the sterility of these collections cannot be fully ascertained on an imaging basis. Extensive areas of passive atelectasis. More focal nodular atelectatic changes in the right lung as well. Could reflect some rounded atelectasis though underlying infection or disease is not fully excluded. Recommend attention on follow-up imaging. 3. Features of anasarca/volume overload with diffuse edematous changes throughout the mediastinum, mediastinum, upper abdomen, mesentery and body wall with small volume abdominal ascites. 4. Aortic Atherosclerosis (ICD10-I70.0). Currently attempting to contact the ordering provider with a critical value result. Addendum will be submitted upon case discussion. Electronically Signed: By: Kreg Shropshire M.D. On: 09/29/2020 15:28   CT ABDOMEN PELVIS W CONTRAST  Result Date: 09/30/2020 CLINICAL DATA:  Unintentional weight loss EXAM: CT ABDOMEN AND PELVIS WITH CONTRAST TECHNIQUE: Multidetector CT imaging of the abdomen and pelvis was performed using the standard protocol following bolus administration of intravenous contrast. CONTRAST:  60mL OMNIPAQUE IOHEXOL 300 MG/ML  SOLN COMPARISON:  None. FINDINGS: Lower chest: Complex moderate right pleural effusion is again identified with associated pleural thickening. Small focus of extrapleural gas within this collection likely relates to recent thoracentesis. There is associated compressive atelectasis of the right lung base. Partially loculated left pleural effusion largely within the left major fissure appears stable since prior CT examination of 09/29/2020. Dense pleural calcifications at the left lung base likely relate to remote trauma or inflammation. Mild global cardiomegaly with particular enlargement of the a right ventricle and right atrium are again identified. Pacemaker leads are seen within the right heart and left ventricular venous  outflow. Extensive calcifications are noted within the coronary arteries. Hepatobiliary: Reflux of contrast into the hepatic venous system is in keeping with at least some degree of right heart failure. Tiny probable cyst within the inferior right hepatic lobe. Liver otherwise unremarkable. No intra or extrahepatic biliary ductal dilation. Gallbladder unremarkable. Pancreas: Unremarkable Spleen: Unremarkable Adrenals/Urinary Tract: The adrenal glands are unremarkable. The kidneys are normal in position. There is mild-to-moderate bilateral renal cortical atrophy noted. Bilateral simple cortical cysts are identified. The kidneys are otherwise unremarkable. The bladder is unremarkable. Stomach/Bowel: Surgical changes of a partial gastrectomy are identified. Moderate sigmoid diverticulosis. The stomach, small bowel, and large bowel are otherwise unremarkable. No evidence of obstruction or focal inflammation. Appendix normal. No free intraperitoneal gas. Mild ascites is present. Vascular/Lymphatic: There is extensive aortoiliac atherosclerotic calcification with particularly prominent atherosclerotic calcification at the origin of the a renal and mesenteric arterial vasculature almost certainly resulting in hemodynamically significant stenoses, not well characterized on this examination. Short segment focal dissection within the infrarenal abdominal aorta, likely the result of a remote penetrating atherosclerotic ulcer results in mild ectasia of the infrarenal abdominal aorta without frank aneurysm formation, with a maximal transaxial dimension of 2.6 cm. The common iliac arteries are ectatic bilaterally, left greater than right, without frank aneurysm formation. Extensive atherosclerotic calcification is noted  within the lower extremity arterial inflow and visualized outflow. Reproductive: Prostate is unremarkable. Other: There is extensive subcutaneous edema noted throughout the body wall as well as retroperitoneal  edema in keeping with changes of anasarca. Musculoskeletal: Degenerative changes are seen within the lumbar spine. No suspicious lytic or blastic bone lesions are seen. Degenerative changes are noted within the hips bilaterally. No acute bone abnormality. IMPRESSION: Complex right hydropneumothorax with small gaseous component likely related to recent thoracentesis. Cardiomegaly and extensive coronary artery calcification. Large mint of the right heart and reflux of contrast into the hepatic venous system in keeping with at least some degree of right heart failure. Dense calcification at the left lung base likely result of remote trauma or inflammation. Diffuse body wall subcutaneous edema, mild ascites, and retroperitoneal edema most in keeping with moderate anasarca, possibly the result of cardiogenic failure. Peripheral vascular disease with extensive atherosclerotic calcification at the origin of the mesenteric and renal vasculature almost certainly resulting in hemodynamically significant stenosis. Clinical correlation for signs and symptoms of chronic mesenteric ischemia and/or significant renal artery stenosis is recommended. Aortic Atherosclerosis (ICD10-I70.0). Electronically Signed   By: Helyn Numbers MD   On: 09/30/2020 13:09   US RENAL  Result Date: 10/03/2020 CLINICAL DATA:  Acute kidney injury in a 87 year old male EXAM: RENAL / URINARY TRACT ULTRASOUND COMPLETE COMPARISON:  Abdomen and pelvis CT from 2022, February 2022 FINDINGS: Right Kidney: Renal measurements: 9.0 x 4.1 x 5.7 cm = volume: 110 mL. Slight increased echogenicity of renal cortex. Cyst in the interpolar aspect of the RIGHT kidney 2.5 x 2.4 x 1.4 cm. No hydronephrosis. Left Kidney: Renal measurements: 9.6 x 4.8 x 4.8 cm = volume: 114 mL. Mild increased echogenicity the without hydronephrosis or focal lesion aside from a small cyst in the lower pole. Bladder: Under distended urinary bladder with small volume fluid in the pelvis,  small volume fluid in the pelvis seen on prior imaging Other: Small volume ascites in the pelvis as discussed. IMPRESSION: Mild increased cortical echogenicity as can be seen in the setting of medical renal disease. No hydronephrosis. Small volume pelvic ascites. Electronically Signed   By: Donzetta Kohut M.D.   On: 10/03/2020 10:52   DG Chest Port 1 View  Result Date: 10/02/2020 CLINICAL DATA:  Fatigue EXAM: PORTABLE CHEST 1 VIEW COMPARISON:  09/30/2020 FINDINGS: There are persistent bilateral loculated pleural effusions which appear to have increased on the right but are stable on the left. Pleural base calcifications are again noted. There is no pneumothorax. The heart size is stable. Aortic calcifications are noted. There is a biventricular pacemaker in place. There is no acute osseous abnormality. IMPRESSION: Persistent bilateral loculated pleural effusions, increased on the right but stable on the left. Electronically Signed   By: Katherine Mantle M.D.   On: 10/02/2020 18:43   DG Chest Port 1 View  Result Date: 09/30/2020 CLINICAL DATA:  Status post right-sided thoracentesis. EXAM: PORTABLE CHEST 1 VIEW COMPARISON:  CT a chest in chest x-ray from yesterday. FINDINGS: Unchanged left chest wall pacemaker. Stable cardiomegaly and pulmonary artery enlargement. Loculated bilateral pleural effusions again noted, slightly decreased on the right status post thoracentesis. No pneumothorax. Unchanged pleural calcification at the lung bases and bilateral lower lobe atelectasis. No acute osseous abnormality. IMPRESSION: 1. Loculated bilateral pleural effusions, slightly decreased on the right status post thoracentesis. No pneumothorax. Electronically Signed   By: Obie Dredge M.D.   On: 09/30/2020 12:34   DG Chest Portable 1 View  Result Date:  09/29/2020 CLINICAL DATA:  Dyspnea EXAM: PORTABLE CHEST 1 VIEW COMPARISON:  None. FINDINGS: Bilateral basilar laterally loculated pleural effusions are present,  moderate on the right and small on the left. Left basilar pleural calcifications are identified. There is enlargement of the right hilum suggesting presence of right hilar mass or right hilar adenopathy. There is partial right lower lobe collapse. Opacity within the left perihilar region may represent fluid within the left major fissure. A left perihilar mass, however, is difficult to exclude. No pneumothorax. Mild cardiomegaly. Left subclavian 3 lead pacemaker in place. Vascular calcifications are seen within the abdominal aorta. The pulmonary vascularity is normal. No acute bone abnormality. IMPRESSION: Suspected right hilar mass or adenopathy with subtotal collapse of the right lower lobe possibly representing postobstructive collapse. Bilateral loculated pleural effusions, moderate on the right and small on the left. Coarse left basilar pleural calcification may relate to remote trauma or infection. Left perihilar opacity possibly representing fluid within the fissure or a left perihilar focal pulmonary mass. This would be better assessed with dedicated contrast enhanced CT imaging. Mild cardiomegaly. Electronically Signed   By: Helyn Numbers MD   On: 09/29/2020 11:08   ECHOCARDIOGRAM COMPLETE  Result Date: 09/30/2020    ECHOCARDIOGRAM REPORT   Patient Name:   Patrick Mccormick Date of Exam: 09/30/2020 Medical Rec #:  [id number]       Height:       67.0 in Accession #:    [id number]      Weight:       88.3 lb Date of Birth:  09/19/1931       BSA:          1.429 m Patient Age:    88 years        BP:           87/50 mmHg Patient Gender: M               HR:           76 bpm. Exam Location:  [facility] Procedure: 2D Echo, Color Doppler and Cardiac Doppler Indications:     R06.00 Dyspnea  History:         Patient has no prior history of Echocardiogram examinations.                  Arrythmias:Paroxymal atrial fibrillation,                  Signs/Symptoms:Shortness of Breath; Risk Factors:Hypertension.  Sonographer:      Humphrey Rolls RDCS (AE) Referring Phys:  [id number] AMY N COX Diagnosing Phys: Harold Hedge MD  Sonographer Comments: TDS due to bony thorax. IMPRESSIONS  1. Left ventricular ejection fraction, by estimation, is 65 to 70%. The left ventricle has normal function. The left ventricle has no regional wall motion abnormalities. Left ventricular diastolic parameters were normal.  2. Right ventricular systolic function is normal. The right ventricular size is mildly enlarged.  3. Left atrial size was mildly dilated.  4. Right atrial size was mildly dilated.  5. The mitral valve is grossly normal. Mild to moderate mitral valve regurgitation.  6. The aortic valve is calcified. Aortic valve regurgitation is trivial. Mild to moderate aortic valve sclerosis/calcification is present, without any evidence of aortic stenosis. FINDINGS  Left Ventricle: Left ventricular ejection fraction, by estimation, is 65 to 70%. The left ventricle has normal function. The left ventricle has no regional wall motion abnormalities. The left ventricular internal cavity size was normal in size. There  is  borderline left ventricular hypertrophy. Left ventricular diastolic parameters were normal. Right Ventricle: The right ventricular size is mildly enlarged. No increase in right ventricular wall thickness. Right ventricular systolic function is normal. Left Atrium: Left atrial size was mildly dilated. Right Atrium: Right atrial size was mildly dilated. Pericardium: There is no evidence of pericardial effusion. Mitral Valve: The mitral valve is grossly normal. Mild to moderate mitral valve regurgitation. MV peak gradient, 3.1 mmHg. The mean mitral valve gradient is 1.0 mmHg. Tricuspid Valve: The tricuspid valve is not well visualized. Tricuspid valve regurgitation is trivial. Aortic Valve: The aortic valve is calcified. Aortic valve regurgitation is trivial. Mild to moderate aortic valve sclerosis/calcification is present, without any evidence of aortic  stenosis. Aortic valve mean gradient measures 3.0 mmHg. Aortic valve peak  gradient measures 5.9 mmHg. Aortic valve area, by VTI measures 1.71 cm. Pulmonic Valve: The pulmonic valve was not well visualized. Pulmonic valve regurgitation is trivial. Aorta: The aortic root is normal in size and structure. IAS/Shunts: No atrial level shunt detected by color flow Doppler. Additional Comments: A pacer wire is visualized.  LEFT VENTRICLE PLAX 2D LVIDd:         3.50 cm LVIDs:         2.10 cm LV PW:         1.10 cm LV IVS:        0.80 cm LVOT diam:     2.20 cm LV SV:         38 LV SV Index:   26 LVOT Area:     3.80 cm  RIGHT VENTRICLE RV Basal diam:  4.80 cm LEFT ATRIUM             Index       RIGHT ATRIUM           Index LA diam:        4.10 cm 2.87 cm/m  RA Area:     30.00 cm LA Vol (A2C):   40.4 ml 28.28 ml/m RA Volume:   112.00 ml 78.39 ml/m LA Vol (A4C):   92.3 ml 64.61 ml/m LA Biplane Vol: 65.3 ml 45.71 ml/m  AORTIC VALVE                   PULMONIC VALVE AV Area (Vmax):    1.97 cm    PV Vmax:       0.68 m/s AV Area (Vmean):   1.92 cm    PV Vmean:      42.900 cm/s AV Area (VTI):     1.71 cm    PV VTI:        0.128 m AV Vmax:           121.00 cm/s PV Peak grad:  1.9 mmHg AV Vmean:          79.000 cm/s PV Mean grad:  1.0 mmHg AV VTI:            0.220 m AV Peak Grad:      5.9 mmHg AV Mean Grad:      3.0 mmHg LVOT Vmax:         62.70 cm/s LVOT Vmean:        40.000 cm/s LVOT VTI:          0.099 m LVOT/AV VTI ratio: 0.45  AORTA Ao Root diam: 3.50 cm MITRAL VALVE MV Area (PHT): 6.96 cm    SHUNTS MV Area VTI:   2.36 cm    Systemic  VTI:  0.10 m MV Peak grad:  3.1 mmHg    Systemic Diam: 2.20 cm MV Mean grad:  1.0 mmHg MV Vmax:       0.88 m/s MV Vmean:      47.8 cm/s MV Decel Time: 109 msec MV E velocity: 75.80 cm/s MV A velocity: 36.80 cm/s MV E/A ratio:  2.06 Harold Hedge MD Electronically signed by Harold Hedge MD Signature Date/Time: 09/30/2020/11:36:34 AM    Final    US LIVER DOPPLER  Result Date:  10/02/2020 CLINICAL DATA:  Hepatic cirrhosis EXAM: DUPLEX ULTRASOUND OF LIVER TECHNIQUE: Color and duplex Doppler ultrasound was performed to evaluate the hepatic in-flow and out-flow vessels. COMPARISON:  09/30/2020 CT with contrast FINDINGS: Liver: Increased hepatic echogenicity. No definite contour abnormality or significant surface nodularity. No focal lesion, mass or intrahepatic biliary ductal dilatation. Main Portal Vein size: 0.9 cm Portal Vein Velocities Main Prox:  21 cm/sec Main Mid: 40 cm/sec Main Dist:  36 cm/sec Right: 32 cm/sec Left: 24 cm/sec Hepatic Vein Velocities Right:  51 cm/sec Middle:  58 cm/sec Left:  51 cm/sec IVC: Present and patent with normal respiratory phasicity. Hepatic Artery Velocity:  71 cm/sec Splenic Vein Velocity:  14 cm/sec Spleen: 4 cm x 4 cm x 6 cm with a total volume of 62 cm^3 (411 cm^3 is upper limit normal) Portal Vein Occlusion/Thrombus: No Splenic Vein Occlusion/Thrombus: No Ascites: None Varices: None IMPRESSION: Hepatic veins and IVC are dilated suggesting component of right heart failure. Portal, hepatic and splenic veins are all patent with normal directional flow. No veno-occlusive process. Electronically Signed   By: Judie Petit.  Shick M.D.   On: 10/02/2020 11:19   US THORACENTESIS ASP PLEURAL SPACE W/IMG GUIDE  Result Date: 09/30/2020 INDICATION: Patient with history of pulmonary hypertension and persistent AFib. Found to have bilateral pleural effusions after presenting to the emergency department with shortness of breath. Team is requesting therapeutic and diagnostic thoracentesis EXAM: ULTRASOUND GUIDED RIGHT-SIDED THERAPEUTIC AND DIAGNOSTIC THORACENTESIS MEDICATIONS: Lidocaine 1% 10 mL COMPLICATIONS: None immediate. PROCEDURE: An ultrasound guided thoracentesis was thoroughly discussed with the patient and questions answered. The benefits, risks, alternatives and complications were also discussed. The patient understands and wishes to proceed with the procedure.  Written consent was obtained. Ultrasound was performed to localize and mark an adequate pocket of fluid in the right chest. The area was then prepped and draped in the normal sterile fashion. 1% Lidocaine was used for local anesthesia. Under ultrasound guidance a 6 Fr Safe-T-Centesis catheter was introduced. Thoracentesis was performed. The catheter was removed and a dressing applied. FINDINGS: A total of approximately 350 mL of amber colored fluid was removed. Samples were sent to the laboratory as requested by the clinical team. Patient unable to tolerate additional fluid removal and per patient request the procedure was terminated. IMPRESSION: Successful ultrasound guided therapeutic and diagnostic right-sided thoracentesis yielding 350 mL of pleural fluid. Read by: Anders Grant, NP Electronically Signed   By: Simonne Come M.D.   On: 09/30/2020 12:54    ECHOCARDIOGRAM REPORT       Patient Name:  Patrick Mccormick Date of Exam: 09/30/2020  Medical Rec #: [id number]    Height:    67.0 in  Accession #:  [id number]   Weight:    88.3 lb  Date of Birth: 03/24/1932    BSA:     1.429 m  Patient Age:  88 years    BP:      87/50 mmHg  Patient Gender: M  HR:      76 bpm.  Exam Location: [facility]   Procedure: 2D Echo, Color Doppler and Cardiac Doppler   Indications:   R06.00 Dyspnea    History:     Patient has no prior history of Echocardiogram  examinations.          Arrythmias:Paroxymal atrial fibrillation,          Signs/Symptoms:Shortness of Breath; Risk  Factors:Hypertension.    Sonographer:   Humphrey Rolls RDCS (AE)  Referring Phys: [id number] AMY N COX  Diagnosing Phys: Harold Hedge MD     Sonographer Comments: TDS due to bony thorax.  IMPRESSIONS    1. Left ventricular ejection fraction, by estimation, is 65 to 70%. The  left ventricle has normal function. The left ventricle has no regional  wall motion  abnormalities. Left ventricular diastolic parameters were  normal.  2. Right ventricular systolic function is normal. The right ventricular  size is mildly enlarged.  3. Left atrial size was mildly dilated.  4. Right atrial size was mildly dilated.  5. The mitral valve is grossly normal. Mild to moderate mitral valve  regurgitation.  6. The aortic valve is calcified. Aortic valve regurgitation is trivial.  Mild to moderate aortic valve sclerosis/calcification is present, without  any evidence of aortic stenosis.   FINDINGS  Left Ventricle: Left ventricular ejection fraction, by estimation, is 65  to 70%. The left ventricle has normal function. The left ventricle has no  regional wall motion abnormalities. The left ventricular internal cavity  size was normal in size. There is  borderline left ventricular hypertrophy. Left ventricular diastolic  parameters were normal.   Right Ventricle: The right ventricular size is mildly enlarged. No  increase in right ventricular wall thickness. Right ventricular systolic  function is normal.   Left Atrium: Left atrial size was mildly dilated.   Right Atrium: Right atrial size was mildly dilated.   Pericardium: There is no evidence of pericardial effusion.   Mitral Valve: The mitral valve is grossly normal. Mild to moderate mitral  valve regurgitation. MV peak gradient, 3.1 mmHg. The mean mitral valve  gradient is 1.0 mmHg.   Tricuspid Valve: The tricuspid valve is not well visualized. Tricuspid  valve regurgitation is trivial.   Aortic Valve: The aortic valve is calcified. Aortic valve regurgitation is  trivial. Mild to moderate aortic valve sclerosis/calcification is present,  without any evidence of aortic stenosis. Aortic valve mean gradient  measures 3.0 mmHg. Aortic valve peak  gradient measures 5.9 mmHg. Aortic valve area, by VTI measures 1.71 cm.   Pulmonic Valve: The pulmonic valve was not well visualized. Pulmonic  valve  regurgitation is trivial.   Aorta: The aortic root is normal in size and structure.   IAS/Shunts: No atrial level shunt detected by color flow Doppler.   Additional Comments: A pacer wire is visualized.     LEFT VENTRICLE  PLAX 2D  LVIDd:     3.50 cm  LVIDs:     2.10 cm  LV PW:     1.10 cm  LV IVS:    0.80 cm  LVOT diam:   2.20 cm  LV SV:     38  LV SV Index:  26  LVOT Area:   3.80 cm     RIGHT VENTRICLE  RV Basal diam: 4.80 cm   LEFT ATRIUM       Index    RIGHT ATRIUM      Index  LA diam:  4.10 cm 2.87 cm/m RA Area:   30.00 cm  LA Vol (A2C):  40.4 ml 28.28 ml/m RA Volume:  112.00 ml 78.39 ml/m  LA Vol (A4C):  92.3 ml 64.61 ml/m  LA Biplane Vol: 65.3 ml 45.71 ml/m  AORTIC VALVE          PULMONIC VALVE  AV Area (Vmax):  1.97 cm  PV Vmax:    0.68 m/s  AV Area (Vmean):  1.92 cm  PV Vmean:   42.900 cm/s  AV Area (VTI):   1.71 cm  PV VTI:    0.128 m  AV Vmax:      121.00 cm/s PV Peak grad: 1.9 mmHg  AV Vmean:     79.000 cm/s PV Mean grad: 1.0 mmHg  AV VTI:      0.220 m  AV Peak Grad:   5.9 mmHg  AV Mean Grad:   3.0 mmHg  LVOT Vmax:     62.70 cm/s  LVOT Vmean:    40.000 cm/s  LVOT VTI:     0.099 m  LVOT/AV VTI ratio: 0.45    AORTA  Ao Root diam: 3.50 cm   MITRAL VALVE  MV Area (PHT): 6.96 cm  SHUNTS  MV Area VTI:  2.36 cm  Systemic VTI: 0.10 m  MV Peak grad: 3.1 mmHg  Systemic Diam: 2.20 cm  MV Mean grad: 1.0 mmHg  MV Vmax:    0.88 m/s  MV Vmean:   47.8 cm/s  MV Decel Time: 109 msec  MV E velocity: 75.80 cm/s  MV A velocity: 36.80 cm/s  MV E/A ratio: 2.06   Harold Hedge MD  Electronically signed by Harold Hedge MD  Signature Date/Time: 09/30/2020/11:36:34 AM       ASSESSMENT/PLAN    Chronic pulmonary effusions -Patient is not in heart failure per most recent TTE -will obtain US liver due to  history of EtOH use  -he has mild aki today buy in general his GFR is within reference range when corrected for age.  -will place patient on midordrine and lasix today and hold revatio for now -Na is elevated likely due to liver dz with third spaced fluid in pleural space -thus far pleural fluid showing borderline exudate lymphocyte predominant which likely reflects chronic effusion.  The remainder of fluid studies including cytology is in process. -repeat CXR - 10/02/2020 10/03/2020- repeat thoracentesis today.   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    Patient/Family are satisfied with care plan and all questions have been answered.  This document was prepared using Dragon voice recognition software and may include unintentional dictation errors.     Vida Rigger, M.D.  Division of Pulmonary & Critical Care Medicine  [facility]

## 2020-10-04 ENCOUNTER — Encounter: Payer: Self-pay | Admitting: Internal Medicine

## 2020-10-04 ENCOUNTER — Inpatient Hospital Stay: Payer: Medicare HMO

## 2020-10-04 DIAGNOSIS — J90 Pleural effusion, not elsewhere classified: Secondary | ICD-10-CM

## 2020-10-04 DIAGNOSIS — R0603 Acute respiratory distress: Secondary | ICD-10-CM | POA: Diagnosis not present

## 2020-10-04 DIAGNOSIS — J9601 Acute respiratory failure with hypoxia: Secondary | ICD-10-CM

## 2020-10-04 DIAGNOSIS — N179 Acute kidney failure, unspecified: Secondary | ICD-10-CM

## 2020-10-04 DIAGNOSIS — E43 Unspecified severe protein-calorie malnutrition: Secondary | ICD-10-CM | POA: Diagnosis not present

## 2020-10-04 DIAGNOSIS — I509 Heart failure, unspecified: Secondary | ICD-10-CM | POA: Diagnosis not present

## 2020-10-04 DIAGNOSIS — Z7189 Other specified counseling: Secondary | ICD-10-CM | POA: Diagnosis not present

## 2020-10-04 DIAGNOSIS — Z515 Encounter for palliative care: Secondary | ICD-10-CM

## 2020-10-04 LAB — TRIGLYCERIDES, BODY FLUIDS: Triglycerides, Fluid: <9 mg/dL

## 2020-10-04 LAB — COMPREHENSIVE METABOLIC PANEL
ALT: 17 U/L (ref 0–44)
AST: 28 U/L (ref 15–41)
Albumin: 3.3 g/dL — ABNORMAL LOW (ref 3.5–5.0)
Alkaline Phosphatase: 100 U/L (ref 38–126)
Anion gap: 10 (ref 5–15)
BUN: 87 mg/dL — ABNORMAL HIGH (ref 8–23)
CO2: 25 mmol/L (ref 22–32)
Calcium: 10.2 mg/dL (ref 8.9–10.3)
Chloride: 88 mmol/L — ABNORMAL LOW (ref 98–111)
Creatinine, Ser: 3.77 mg/dL — ABNORMAL HIGH (ref 0.61–1.24)
GFR, Estimated: 15 mL/min — ABNORMAL LOW (ref >60)
Glucose, Bld: 122 mg/dL — ABNORMAL HIGH (ref 70–99)
Potassium: 5.6 mmol/L — ABNORMAL HIGH (ref 3.5–5.1)
Sodium: 123 mmol/L — ABNORMAL LOW (ref 135–145)
Total Bilirubin: 0.6 mg/dL (ref 0.3–1.2)
Total Protein: 6.3 g/dL — ABNORMAL LOW (ref 6.5–8.1)

## 2020-10-04 LAB — URINALYSIS, ROUTINE W REFLEX MICROSCOPIC
Bacteria, UA: NONE SEEN
Bilirubin Urine: NEGATIVE
Glucose, UA: NEGATIVE mg/dL
Hgb urine dipstick: NEGATIVE
Ketones, ur: NEGATIVE mg/dL
Leukocytes,Ua: NEGATIVE
Nitrite: NEGATIVE
Protein, ur: 30 mg/dL — AB
Specific Gravity, Urine: 1.024 (ref 1.005–1.030)
Squamous Epithelial / HPF: NONE SEEN (ref 0–5)
pH: 5 (ref 5.0–8.0)

## 2020-10-04 LAB — POTASSIUM: Potassium: 5.3 mmol/L — ABNORMAL HIGH (ref 3.5–5.1)

## 2020-10-04 LAB — PROTEIN / CREATININE RATIO, URINE
Creatinine, Urine: 78 mg/dL
Protein Creatinine Ratio: 0.68 mg/mg{Cre} — ABNORMAL HIGH (ref 0.00–0.15)
Total Protein, Urine: 53 mg/dL

## 2020-10-04 MED ORDER — HYDROCORTISONE NA SUCCINATE PF 100 MG IJ SOLR
50.0000 mg | Freq: Four times a day (QID) | INTRAMUSCULAR | Status: DC
  Administered 2020-10-05 (×2): 50 mg via INTRAVENOUS
  Filled 2020-10-04 (×4): qty 1

## 2020-10-04 MED ORDER — SODIUM ZIRCONIUM CYCLOSILICATE 10 G PO PACK
10.0000 g | PACK | Freq: Two times a day (BID) | ORAL | Status: DC
  Administered 2020-10-04 – 2020-10-06 (×5): 10 g via ORAL
  Filled 2020-10-04 (×7): qty 1

## 2020-10-04 MED ORDER — FUROSEMIDE 10 MG/ML IJ SOLN
4.0000 mg/h | INTRAVENOUS | Status: DC
  Administered 2020-10-04: 4 mg/h via INTRAVENOUS
  Filled 2020-10-04: qty 20

## 2020-10-04 MED ORDER — MILRINONE LACTATE IN DEXTROSE 20-5 MG/100ML-% IV SOLN
0.2500 ug/kg/min | INTRAVENOUS | Status: DC
  Filled 2020-10-04: qty 100

## 2020-10-04 MED ORDER — HYDROCORTISONE NA SUCCINATE PF 250 MG IJ SOLR
50.0000 mg | Freq: Four times a day (QID) | INTRAMUSCULAR | Status: DC
  Administered 2020-10-04 (×4): 50 mg via INTRAVENOUS
  Filled 2020-10-04 (×6): qty 50

## 2020-10-04 NOTE — Progress Notes (Signed)
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  Admits to wt loss>40lbs.  He further endorses new watery diarrhea, three times per day for one week. He denies blood. He endorses history of persistent right pleural effusion status post thoracentesis in which he had about two liters removed from only his right lungs about two years ago.  : He is married and lives with his spouse of 50 years. 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 He has never been a tobacco user. He drinks about two glasses of wine per night. PCCM consultation for pulmnary hypertension.     Patient had thoracentesis donex2 in 2020 and "was trying to control that",  He had one thoracentesis after admission here at [facility] and was able to get 300cc thoracentesis done but asked for procedure to be stopped half way throughout.    [name] is [name] is present today. She shares he is a chronic drinker of alcohol 1 day prior to admission he drank 3 glasses of wine.  He had TTE which was essentially normal.   -10/02/20-patient weaned to 2L/min [place].  He had episodes of hypotension despite midordrine.  He developed worsening aki overnight and renal consultation has been placed.  He has not diuresed well.  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  Vascular surgery has been  consulted.  Na levels are still low 120.  He remains without leukocytosis or fevers. Patient has been on lasix , he has IV fluids ordered today, I can dc lasix for now. Will repeat CXR today.  If patient is not surgical candidate he will need riociguat for CTEPH.   10/03/2020- patient is stable this am.  I called his son and reviewed hospital course and differential. He has significant aki and we reviewed case today.   10/04/2020- patient had repeat thoracentesis - removed 340cc from right pleural space. He is breathing better now, he was weaned down from 2L/min [place] to 1L/min [place].  He is oliguric with worsening AKI, however mentating well and is pleasant.  He is with severe senile hearing loss. Fluid with negative cytology for atypia 09/30/20 specimen.  Reviewed careplan with renal and primary team today.   PAST MEDICAL HISTORY   History reviewed. No pertinent past medical history.   SURGICAL HISTORY   History reviewed. No pertinent surgical history.   FAMILY HISTORY   History reviewed. No pertinent family history.   SOCIAL HISTORY   Social History   Tobacco Use  . Smoking status: Never Smoker  . Smokeless tobacco: Never Used     MEDICATIONS    Home Medication:    Current Medication:  Current Facility-Administered Medications:  .  allopurinol (ZYLOPRIM) tablet 100 mg, 100 mg, Oral, Daily, Cox, Amy N,  DO, 100 mg at 10/04/20 0909 .  apixaban (ELIQUIS) tablet 10 mg, 10 mg, Oral, BID, 10 mg at 10/04/20 0909 **FOLLOWED BY** [START ON 10/07/2020] apixaban (ELIQUIS) tablet 5 mg, 5 mg, Oral, BID, [name], [name], MD .  ascorbic acid (VITAMIN C) tablet 250 mg, 250 mg, Oral, BID, [name], MD, 250 mg at 10/04/20 0909 .  atorvastatin (LIPITOR) tablet 20 mg, 20 mg, Oral, Daily, Cox, Amy N, DO, 20 mg at 10/04/20 0909 .  feeding supplement (ENSURE ENLIVE / ENSURE PLUS) liquid 237 mL, 237 mL, Oral, TID BM, [name], [name], MD, 237 mL at 10/04/20 0910 .  hydrocortisone sodium  succinate (SOLU-CORTEF) injection 50 mg, 50 mg, Intravenous, Q6H, [name], MD, 50 mg at 10/04/20 0526 .  levothyroxine (SYNTHROID) tablet 25 mcg, 25 mcg, Oral, Q0600, [name], MD, 25 mcg at 10/04/20 0526 .  metoprolol tartrate (LOPRESSOR) injection 5 mg, 5 mg, Intravenous, Q4H PRN, Cox, Amy N, DO .  midodrine (PROAMATINE) tablet 10 mg, 10 mg, Oral, QID, Manuela Schwartz, NP, 10 mg at 10/04/20 0909 .  mirtazapine (REMERON) tablet 7.5 mg, 7.5 mg, Oral, QHS, Cox, Amy N, DO, 7.5 mg at 10/03/20 2019 .  multivitamin with minerals tablet 1 tablet, 1 tablet, Oral, Daily, [name], MD, 1 tablet at 10/04/20 0910 .  ondansetron (ZOFRAN) tablet 4 mg, 4 mg, Oral, Q6H PRN **OR** ondansetron (ZOFRAN) injection 4 mg, 4 mg, Intravenous, Q6H PRN, Cox, Amy N, DO, 4 mg at 10/03/20 1633 .  sodium chloride (OCEAN) 0.65 % nasal spray 1 spray, 1 spray, Each Nare, PRN, [name], MD .  sodium zirconium cyclosilicate (LOKELMA) packet 10 g, 10 g, Oral, BID, [name], MD, 10 g at 10/04/20 0909    ALLERGIES   Patient has no known allergies.     REVIEW OF SYSTEMS    Review of Systems:  Gen:  Denies  fever, sweats, chills weigh loss  HEENT: Denies blurred vision, double vision, ear pain, eye pain, hearing loss, nose bleeds, sore throat Cardiac:  No dizziness, chest pain or heaviness, chest tightness,edema Resp:   Denies cough or sputum porduction, shortness of breath,wheezing, hemoptysis,  Gi: Denies swallowing difficulty, stomach pain, nausea or vomiting, diarrhea, constipation, bowel incontinence Gu:  Denies bladder incontinence, burning urine Ext:   Denies Joint pain, stiffness or swelling Skin: Denies  skin rash, easy bruising or bleeding or hives Endoc:  Denies polyuria, polydipsia , polyphagia or weight change Psych:   Denies depression, insomnia or hallucinations   Other:  All other systems negative   VS: BP 104/64 (BP Location: Left Arm)   Pulse 60   Temp 99.4 F (37.4 C)  (Oral)   Resp 16   Ht 5\' 7"  (1.702 m)   Wt 39.4 kg   SpO2 100%   BMI 13.59 kg/m      PHYSICAL EXAM    GENERAL:NAD, no fevers, chills, no weakness no fatigue HEAD: Normocephalic, atraumatic.  EYES: Pupils equal, round, reactive to light. Extraocular muscles intact. No scleral icterus.  MOUTH: Moist mucosal membrane. Dentition intact. No abscess noted.  EAR, NOSE, THROAT: Clear without exudates. No external lesions.  NECK: Supple. No thyromegaly. No nodules. No JVD.  PULMONARY: Decreased breath sounds bilaterally  CARDIOVASCULAR: S1 and S2. Regular rate and rhythm. No murmurs, rubs, or gallops. No edema. Pedal pulses 2+ bilaterally.  GASTROINTESTINAL: Soft, nontender, nondistended. No masses. Positive bowel sounds. No hepatosplenomegaly.  MUSCULOSKELETAL: No swelling, clubbing, or edema. Range of motion full in all extremities.  NEUROLOGIC: Cranial  nerves II through XII are intact. No gross focal neurological deficits. Sensation intact. Reflexes intact.  SKIN: No ulceration, lesions, rashes, or cyanosis. Skin warm and dry. Turgor intact.  PSYCHIATRIC: Mood, affect within normal limits. The patient is awake, alert and oriented x 3. Insight, judgment intact.       IMAGING    DG Chest 2 View  Result Date: 10/03/2020 CLINICAL DATA:  Acute renal injury EXAM: CHEST - 2 VIEW COMPARISON:  10/02/2020 FINDINGS: LEFT-sided pacemaker overlies stable cardiac silhouette. Large bilateral loculated pleural effusions. The LEFT effusion appears slightly decreased. No pneumothorax. Calcified pleural plaque over the LEFT hemidiaphragm. IMPRESSION: 1. Bilateral large loculated pleural effusions. 2. LEFT effusion may be slightly reduced. 3. No pulmonary edema or pneumothorax. Electronically Signed   By: Genevive Bi M.D.   On: 10/03/2020 10:23   CT Angio Chest PE W and/or Wo Contrast  Addendum Date: 09/29/2020   ADDENDUM REPORT: 09/29/2020 15:35 ADDENDUM: These results were called by telephone at  the time of interpretation on 09/29/2020 at 3:35 pm to provider AMY COX , who verbally acknowledged these results. Electronically Signed   By: Kreg Shropshire M.D.   On: 09/29/2020 15:35   Result Date: 09/29/2020 CLINICAL DATA:  Dyspnea EXAM: CT ANGIOGRAPHY CHEST WITH CONTRAST TECHNIQUE: Multidetector CT imaging of the chest was performed using the standard protocol during bolus administration of intravenous contrast. Multiplanar CT image reconstructions and MIPs were obtained to evaluate the vascular anatomy. CONTRAST:  75mL OMNIPAQUE IOHEXOL 350 MG/ML SOLN COMPARISON:  Radiograph 09/29/2020 FINDINGS: Cardiovascular: Satisfactory opacification of pulmonary arteries. Some thin linear web-like bands of hypoattenuation in the distal left main pulmonary artery and lingular branches. Some more anti dependent hypoattenuation within the segmental pulmonary arteries of the left lower lobe have an appearance favored to be laminar flow and mixing artifact though a more subacute to remote pulmonary embolism is not fully excluded. Central pulmonary arteries are enlarged. There is marked cardiomegaly with four-chamber enlargement. Three-vessel coronary artery atherosclerosis is present. Left chest wall pacer pack is noted with leads at the right atrium, coronary sinus and cardiac apex. Trace pericardial effusion. Suboptimal opacification of the aorta for luminal assessment. Atherosclerotic plaque within the normal caliber aorta. Normal 3 vessel branching of the aortic arch. Reflux of contrast in the hepatic veins and IVC. Mediastinum/Nodes: Diffuse edematous changes throughout the mediastinum. Scattered low-attenuation mediastinal and hilar lymph nodes favored to be reactive/edematous. No concerning axillary adenopathy. Fluid-filled, patulous thoracic esophagus. Trachea is unremarkable. No concerning thyroid abnormality though poorly evaluated with superimposed streak artifact. Lungs/Pleura: Complex, bilateral loculated pleural  effusions which are predominantly low-attenuation. There are regions of dense basilar pleural calcification in both lungs. A more diffuse pleural thickening is noted in the right lung as well. There are areas of adjacent passive atelectasis with additional more focal nodular atelectatic changes in the right lung (6/64). Could reflect some rounded atelectasis though underlying infection or disease is not fully excluded. Additional areas of bandlike reticular opacity with architectural distortion and scarring. Diffuse airways thickening and scattered secretions are noted as well. No pneumothorax. Upper Abdomen: Diffuse edematous changes noted throughout the upper abdomen and mesentery with small volume ascites. Extensive atherosclerotic plaque in the upper abdominal aorta. Calcified septation in the infrarenal abdominal aorta may reflect sequela of prior dissection or lamellar calcification of the aorta itself, incompletely assessed on this exam. Postsurgical changes from prior gastric surgery. Musculoskeletal: The osseous structures appear diffusely demineralized which may limit detection of small or nondisplaced fractures. No acute osseous  abnormality or suspicious osseous lesion. Degenerative changes are present in the imaged spine and shoulders. Diffuse body wall edema. Review of the MIP images confirms the above findings. IMPRESSION: 1. Web-like filling defects in the distal left main pulmonary artery and extending into the lingular branches may reflect subacute to chronic pulmonary embolism with acute pulmonary embolism less favored. More antidependent incomplete opacification of the left lower lobe pulmonary arteries could reflect laminar flow artifact though underlying embolism is not excluded. Evaluation for right heart strain is precluded in the setting of diffuse cardiomegaly and four-chamber cardiac enlargement though there is evidence of elevated right heart pressures and right-sided dysfunction with  enlarged pulmonary arteries and refluxing contrast into the hepatic veins and IVC. Consider further characterization with echocardiography as clinically warranted. 2. Complex, bilateral loculated pleural effusions which are predominantly low-attenuation. Associated regions of dense basilar pleural calcification in both lungs. Findings can be on the spectrum of underlying asbestos related pleural disease though the sterility of these collections cannot be fully ascertained on an imaging basis. Extensive areas of passive atelectasis. More focal nodular atelectatic changes in the right lung as well. Could reflect some rounded atelectasis though underlying infection or disease is not fully excluded. Recommend attention on follow-up imaging. 3. Features of anasarca/volume overload with diffuse edematous changes throughout the mediastinum, mediastinum, upper abdomen, mesentery and body wall with small volume abdominal ascites. 4. Aortic Atherosclerosis (ICD10-I70.0). Currently attempting to contact the ordering provider with a critical value result. Addendum will be submitted upon case discussion. Electronically Signed: By: Kreg Shropshire M.D. On: 09/29/2020 15:28   CT ABDOMEN PELVIS W CONTRAST  Result Date: 09/30/2020 CLINICAL DATA:  Unintentional weight loss EXAM: CT ABDOMEN AND PELVIS WITH CONTRAST TECHNIQUE: Multidetector CT imaging of the abdomen and pelvis was performed using the standard protocol following bolus administration of intravenous contrast. CONTRAST:  60mL OMNIPAQUE IOHEXOL 300 MG/ML  SOLN COMPARISON:  None. FINDINGS: Lower chest: Complex moderate right pleural effusion is again identified with associated pleural thickening. Small focus of extrapleural gas within this collection likely relates to recent thoracentesis. There is associated compressive atelectasis of the right lung base. Partially loculated left pleural effusion largely within the left major fissure appears stable since prior CT  examination of 09/29/2020. Dense pleural calcifications at the left lung base likely relate to remote trauma or inflammation. Mild global cardiomegaly with particular enlargement of the a right ventricle and right atrium are again identified. Pacemaker leads are seen within the right heart and left ventricular venous outflow. Extensive calcifications are noted within the coronary arteries. Hepatobiliary: Reflux of contrast into the hepatic venous system is in keeping with at least some degree of right heart failure. Tiny probable cyst within the inferior right hepatic lobe. Liver otherwise unremarkable. No intra or extrahepatic biliary ductal dilation. Gallbladder unremarkable. Pancreas: Unremarkable Spleen: Unremarkable Adrenals/Urinary Tract: The adrenal glands are unremarkable. The kidneys are normal in position. There is mild-to-moderate bilateral renal cortical atrophy noted. Bilateral simple cortical cysts are identified. The kidneys are otherwise unremarkable. The bladder is unremarkable. Stomach/Bowel: Surgical changes of a partial gastrectomy are identified. Moderate sigmoid diverticulosis. The stomach, small bowel, and large bowel are otherwise unremarkable. No evidence of obstruction or focal inflammation. Appendix normal. No free intraperitoneal gas. Mild ascites is present. Vascular/Lymphatic: There is extensive aortoiliac atherosclerotic calcification with particularly prominent atherosclerotic calcification at the origin of the a renal and mesenteric arterial vasculature almost certainly resulting in hemodynamically significant stenoses, not well characterized on this examination. Short segment focal dissection within  the infrarenal abdominal aorta, likely the result of a remote penetrating atherosclerotic ulcer results in mild ectasia of the infrarenal abdominal aorta without frank aneurysm formation, with a maximal transaxial dimension of 2.6 cm. The common iliac arteries are ectatic bilaterally,  left greater than right, without frank aneurysm formation. Extensive atherosclerotic calcification is noted within the lower extremity arterial inflow and visualized outflow. Reproductive: Prostate is unremarkable. Other: There is extensive subcutaneous edema noted throughout the body wall as well as retroperitoneal edema in keeping with changes of anasarca. Musculoskeletal: Degenerative changes are seen within the lumbar spine. No suspicious lytic or blastic bone lesions are seen. Degenerative changes are noted within the hips bilaterally. No acute bone abnormality. IMPRESSION: Complex right hydropneumothorax with small gaseous component likely related to recent thoracentesis. Cardiomegaly and extensive coronary artery calcification. Large mint of the right heart and reflux of contrast into the hepatic venous system in keeping with at least some degree of right heart failure. Dense calcification at the left lung base likely result of remote trauma or inflammation. Diffuse body wall subcutaneous edema, mild ascites, and retroperitoneal edema most in keeping with moderate anasarca, possibly the result of cardiogenic failure. Peripheral vascular disease with extensive atherosclerotic calcification at the origin of the mesenteric and renal vasculature almost certainly resulting in hemodynamically significant stenosis. Clinical correlation for signs and symptoms of chronic mesenteric ischemia and/or significant renal artery stenosis is recommended. Aortic Atherosclerosis (ICD10-I70.0). Electronically Signed   By: Helyn Numbers MD   On: 09/30/2020 13:09   US RENAL  Result Date: 10/03/2020 CLINICAL DATA:  Acute kidney injury in a 86 year old male EXAM: RENAL / URINARY TRACT ULTRASOUND COMPLETE COMPARISON:  Abdomen and pelvis CT from 2022, February 2022 FINDINGS: Right Kidney: Renal measurements: 9.0 x 4.1 x 5.7 cm = volume: 110 mL. Slight increased echogenicity of renal cortex. Cyst in the interpolar aspect of the  RIGHT kidney 2.5 x 2.4 x 1.4 cm. No hydronephrosis. Left Kidney: Renal measurements: 9.6 x 4.8 x 4.8 cm = volume: 114 mL. Mild increased echogenicity the without hydronephrosis or focal lesion aside from a small cyst in the lower pole. Bladder: Under distended urinary bladder with small volume fluid in the pelvis, small volume fluid in the pelvis seen on prior imaging Other: Small volume ascites in the pelvis as discussed. IMPRESSION: Mild increased cortical echogenicity as can be seen in the setting of medical renal disease. No hydronephrosis. Small volume pelvic ascites. Electronically Signed   By: Donzetta Kohut M.D.   On: 10/03/2020 10:52   DG Chest Port 1 View  Result Date: 10/04/2020 CLINICAL DATA:  Prior right thoracentesis. EXAM: PORTABLE CHEST 1 VIEW COMPARISON:  10/03/2020.  10/02/2020. FINDINGS: AICD noted stable position. Stable cardiomegaly. Bilateral interstitial infiltrates/edema cannot be excluded. Right pleural effusion is stable. Stable loculated left pleural effusion. Left base calcified pleural plaques again noted. No pneumothorax noted on today's exam. IMPRESSION: 1. Stable right pleural effusion. No pneumothorax noted on today's exam. Stable loculated left pleural effusion. 2. AICD noted stable position. Stable cardiomegaly. Bilateral interstitial edema/infiltrates cannot be excluded. Electronically Signed   By: Maisie Fus  Register   On: 10/04/2020 08:24   DG Chest Port 1 View  Result Date: 10/03/2020 CLINICAL DATA:  Status post thoracentesis. EXAM: PORTABLE CHEST 1 VIEW COMPARISON:  Same day. FINDINGS: Stable cardiomediastinal silhouette. Right pleural effusion is significantly smaller status post thoracentesis. Possible small right apical pneumothorax is noted which most likely represents ex vacuo pneumothorax. Stable right lung opacity is noted. IMPRESSION: Right pleural effusion  is significantly smaller status post thoracentesis. Possible small right apical pneumothorax is noted which  most likely represents ex vacuo pneumothorax. Stable right lung opacity. Electronically Signed   By: Lupita Raider M.D.   On: 10/03/2020 16:10   DG Chest Port 1 View  Result Date: 10/02/2020 CLINICAL DATA:  Fatigue EXAM: PORTABLE CHEST 1 VIEW COMPARISON:  09/30/2020 FINDINGS: There are persistent bilateral loculated pleural effusions which appear to have increased on the right but are stable on the left. Pleural base calcifications are again noted. There is no pneumothorax. The heart size is stable. Aortic calcifications are noted. There is a biventricular pacemaker in place. There is no acute osseous abnormality. IMPRESSION: Persistent bilateral loculated pleural effusions, increased on the right but stable on the left. Electronically Signed   By: Katherine Mantle M.D.   On: 10/02/2020 18:43   DG Chest Port 1 View  Result Date: 09/30/2020 CLINICAL DATA:  Status post right-sided thoracentesis. EXAM: PORTABLE CHEST 1 VIEW COMPARISON:  CT a chest in chest x-ray from yesterday. FINDINGS: Unchanged left chest wall pacemaker. Stable cardiomegaly and pulmonary artery enlargement. Loculated bilateral pleural effusions again noted, slightly decreased on the right status post thoracentesis. No pneumothorax. Unchanged pleural calcification at the lung bases and bilateral lower lobe atelectasis. No acute osseous abnormality. IMPRESSION: 1. Loculated bilateral pleural effusions, slightly decreased on the right status post thoracentesis. No pneumothorax. Electronically Signed   By: Obie Dredge M.D.   On: 09/30/2020 12:34   DG Chest Portable 1 View  Result Date: 09/29/2020 CLINICAL DATA:  Dyspnea EXAM: PORTABLE CHEST 1 VIEW COMPARISON:  None. FINDINGS: Bilateral basilar laterally loculated pleural effusions are present, moderate on the right and small on the left. Left basilar pleural calcifications are identified. There is enlargement of the right hilum suggesting presence of right hilar mass or right hilar  adenopathy. There is partial right lower lobe collapse. Opacity within the left perihilar region may represent fluid within the left major fissure. A left perihilar mass, however, is difficult to exclude. No pneumothorax. Mild cardiomegaly. Left subclavian 3 lead pacemaker in place. Vascular calcifications are seen within the abdominal aorta. The pulmonary vascularity is normal. No acute bone abnormality. IMPRESSION: Suspected right hilar mass or adenopathy with subtotal collapse of the right lower lobe possibly representing postobstructive collapse. Bilateral loculated pleural effusions, moderate on the right and small on the left. Coarse left basilar pleural calcification may relate to remote trauma or infection. Left perihilar opacity possibly representing fluid within the fissure or a left perihilar focal pulmonary mass. This would be better assessed with dedicated contrast enhanced CT imaging. Mild cardiomegaly. Electronically Signed   By: Helyn Numbers MD   On: 09/29/2020 11:08   ECHOCARDIOGRAM COMPLETE  Result Date: 09/30/2020    ECHOCARDIOGRAM REPORT   Patient Name:   DALEY GOSSE Date of Exam: 09/30/2020 Medical Rec #:  [id number]       Height:       67.0 in Accession #:    [id number]      Weight:       88.3 lb Date of Birth:  November 10, 1931       BSA:          1.429 m Patient Age:    88 years        BP:           87/50 mmHg Patient Gender: M               HR:  76 bpm. Exam Location:  [facility] Procedure: 2D Echo, Color Doppler and Cardiac Doppler Indications:     R06.00 Dyspnea  History:         Patient has no prior history of Echocardiogram examinations.                  Arrythmias:Paroxymal atrial fibrillation,                  Signs/Symptoms:Shortness of Breath; Risk Factors:Hypertension.  Sonographer:     Humphrey RollsJoan Heiss RDCS (AE) Referring Phys:  [id number] AMY N COX Diagnosing Phys: Harold HedgeKenneth Fath MD  Sonographer Comments: TDS due to bony thorax. IMPRESSIONS  1. Left ventricular ejection fraction, by  estimation, is 65 to 70%. The left ventricle has normal function. The left ventricle has no regional wall motion abnormalities. Left ventricular diastolic parameters were normal.  2. Right ventricular systolic function is normal. The right ventricular size is mildly enlarged.  3. Left atrial size was mildly dilated.  4. Right atrial size was mildly dilated.  5. The mitral valve is grossly normal. Mild to moderate mitral valve regurgitation.  6. The aortic valve is calcified. Aortic valve regurgitation is trivial. Mild to moderate aortic valve sclerosis/calcification is present, without any evidence of aortic stenosis. FINDINGS  Left Ventricle: Left ventricular ejection fraction, by estimation, is 65 to 70%. The left ventricle has normal function. The left ventricle has no regional wall motion abnormalities. The left ventricular internal cavity size was normal in size. There is  borderline left ventricular hypertrophy. Left ventricular diastolic parameters were normal. Right Ventricle: The right ventricular size is mildly enlarged. No increase in right ventricular wall thickness. Right ventricular systolic function is normal. Left Atrium: Left atrial size was mildly dilated. Right Atrium: Right atrial size was mildly dilated. Pericardium: There is no evidence of pericardial effusion. Mitral Valve: The mitral valve is grossly normal. Mild to moderate mitral valve regurgitation. MV peak gradient, 3.1 mmHg. The mean mitral valve gradient is 1.0 mmHg. Tricuspid Valve: The tricuspid valve is not well visualized. Tricuspid valve regurgitation is trivial. Aortic Valve: The aortic valve is calcified. Aortic valve regurgitation is trivial. Mild to moderate aortic valve sclerosis/calcification is present, without any evidence of aortic stenosis. Aortic valve mean gradient measures 3.0 mmHg. Aortic valve peak  gradient measures 5.9 mmHg. Aortic valve area, by VTI measures 1.71 cm. Pulmonic Valve: The pulmonic valve was not  well visualized. Pulmonic valve regurgitation is trivial. Aorta: The aortic root is normal in size and structure. IAS/Shunts: No atrial level shunt detected by color flow Doppler. Additional Comments: A pacer wire is visualized.  LEFT VENTRICLE PLAX 2D LVIDd:         3.50 cm LVIDs:         2.10 cm LV PW:         1.10 cm LV IVS:        0.80 cm LVOT diam:     2.20 cm LV SV:         38 LV SV Index:   26 LVOT Area:     3.80 cm  RIGHT VENTRICLE RV Basal diam:  4.80 cm LEFT ATRIUM             Index       RIGHT ATRIUM           Index LA diam:        4.10 cm 2.87 cm/m  RA Area:     30.00 cm LA Vol (A2C):  40.4 ml 28.28 ml/m RA Volume:   112.00 ml 78.39 ml/m LA Vol (A4C):   92.3 ml 64.61 ml/m LA Biplane Vol: 65.3 ml 45.71 ml/m  AORTIC VALVE                   PULMONIC VALVE AV Area (Vmax):    1.97 cm    PV Vmax:       0.68 m/s AV Area (Vmean):   1.92 cm    PV Vmean:      42.900 cm/s AV Area (VTI):     1.71 cm    PV VTI:        0.128 m AV Vmax:           121.00 cm/s PV Peak grad:  1.9 mmHg AV Vmean:          79.000 cm/s PV Mean grad:  1.0 mmHg AV VTI:            0.220 m AV Peak Grad:      5.9 mmHg AV Mean Grad:      3.0 mmHg LVOT Vmax:         62.70 cm/s LVOT Vmean:        40.000 cm/s LVOT VTI:          0.099 m LVOT/AV VTI ratio: 0.45  AORTA Ao Root diam: 3.50 cm MITRAL VALVE MV Area (PHT): 6.96 cm    SHUNTS MV Area VTI:   2.36 cm    Systemic VTI:  0.10 m MV Peak grad:  3.1 mmHg    Systemic Diam: 2.20 cm MV Mean grad:  1.0 mmHg MV Vmax:       0.88 m/s MV Vmean:      47.8 cm/s MV Decel Time: 109 msec MV E velocity: 75.80 cm/s MV A velocity: 36.80 cm/s MV E/A ratio:  2.06 Harold Hedge MD Electronically signed by Harold Hedge MD Signature Date/Time: 09/30/2020/11:36:34 AM    Final    US LIVER DOPPLER  Result Date: 10/02/2020 CLINICAL DATA:  Hepatic cirrhosis EXAM: DUPLEX ULTRASOUND OF LIVER TECHNIQUE: Color and duplex Doppler ultrasound was performed to evaluate the hepatic in-flow and out-flow vessels.  COMPARISON:  09/30/2020 CT with contrast FINDINGS: Liver: Increased hepatic echogenicity. No definite contour abnormality or significant surface nodularity. No focal lesion, mass or intrahepatic biliary ductal dilatation. Main Portal Vein size: 0.9 cm Portal Vein Velocities Main Prox:  21 cm/sec Main Mid: 40 cm/sec Main Dist:  36 cm/sec Right: 32 cm/sec Left: 24 cm/sec Hepatic Vein Velocities Right:  51 cm/sec Middle:  58 cm/sec Left:  51 cm/sec IVC: Present and patent with normal respiratory phasicity. Hepatic Artery Velocity:  71 cm/sec Splenic Vein Velocity:  14 cm/sec Spleen: 4 cm x 4 cm x 6 cm with a total volume of 62 cm^3 (411 cm^3 is upper limit normal) Portal Vein Occlusion/Thrombus: No Splenic Vein Occlusion/Thrombus: No Ascites: None Varices: None IMPRESSION: Hepatic veins and IVC are dilated suggesting component of right heart failure. Portal, hepatic and splenic veins are all patent with normal directional flow. No veno-occlusive process. Electronically Signed   By: Judie Petit.  Shick M.D.   On: 10/02/2020 11:19   US THORACENTESIS ASP PLEURAL SPACE W/IMG GUIDE  Result Date: 10/04/2020 INDICATION: Patient with history of pulmonary hypertension and persistent AFib. Found to have a recurrent right-sided pleural effusion. Team is requesting therapeutic and diagnostic thoracentesis EXAM: ULTRASOUND GUIDED THERAPEUTIC AND DIAGNOSTIC THORACENTESIS MEDICATIONS: Lidocaine 1% 10 mL COMPLICATIONS: None immediate. PROCEDURE: An ultrasound guided thoracentesis was  thoroughly discussed with the patient and questions answered. The benefits, risks, alternatives and complications were also discussed. The patient understands and wishes to proceed with the procedure. Written consent was obtained. Ultrasound was performed to localize and mark an adequate pocket of fluid in the right chest. The area was then prepped and draped in the normal sterile fashion. 1% Lidocaine was used for local anesthesia. Under ultrasound guidance a  6 Fr Safe-T-Centesis catheter was introduced. Thoracentesis was performed. The catheter was removed and a dressing applied. FINDINGS: A total of approximately 350 mL of amber colored fluid was removed. Samples were sent to the laboratory as requested by the clinical team. IMPRESSION: Successful ultrasound guided therapeutic and diagnostic right sided thoracentesis yielding 350 mL of pleural fluid. Read by: Anders Grant, NP Electronically Signed   By: Acquanetta Belling M.D.   On: 10/03/2020 16:35   US THORACENTESIS ASP PLEURAL SPACE W/IMG GUIDE  Result Date: 09/30/2020 INDICATION: Patient with history of pulmonary hypertension and persistent AFib. Found to have bilateral pleural effusions after presenting to the emergency department with shortness of breath. Team is requesting therapeutic and diagnostic thoracentesis EXAM: ULTRASOUND GUIDED RIGHT-SIDED THERAPEUTIC AND DIAGNOSTIC THORACENTESIS MEDICATIONS: Lidocaine 1% 10 mL COMPLICATIONS: None immediate. PROCEDURE: An ultrasound guided thoracentesis was thoroughly discussed with the patient and questions answered. The benefits, risks, alternatives and complications were also discussed. The patient understands and wishes to proceed with the procedure. Written consent was obtained. Ultrasound was performed to localize and mark an adequate pocket of fluid in the right chest. The area was then prepped and draped in the normal sterile fashion. 1% Lidocaine was used for local anesthesia. Under ultrasound guidance a 6 Fr Safe-T-Centesis catheter was introduced. Thoracentesis was performed. The catheter was removed and a dressing applied. FINDINGS: A total of approximately 350 mL of amber colored fluid was removed. Samples were sent to the laboratory as requested by the clinical team. Patient unable to tolerate additional fluid removal and per patient request the procedure was terminated. IMPRESSION: Successful ultrasound guided therapeutic and diagnostic right-sided  thoracentesis yielding 350 mL of pleural fluid. Read by: Anders Grant, NP Electronically Signed   By: Simonne Come M.D.   On: 09/30/2020 12:54    ECHOCARDIOGRAM REPORT       Patient Name:  ETHANJAMES FONTENOT Date of Exam: 09/30/2020  Medical Rec #: [id number]    Height:    67.0 in  Accession #:  [id number]   Weight:    88.3 lb  Date of Birth: 1931/12/28    BSA:     1.429 m  Patient Age:  88 years    BP:      87/50 mmHg  Patient Gender: M        HR:      76 bpm.  Exam Location: [facility]   Procedure: 2D Echo, Color Doppler and Cardiac Doppler   Indications:   R06.00 Dyspnea    History:     Patient has no prior history of Echocardiogram  examinations.          Arrythmias:Paroxymal atrial fibrillation,          Signs/Symptoms:Shortness of Breath; Risk  Factors:Hypertension.    Sonographer:   Humphrey Rolls RDCS (AE)  Referring Phys: [id number] AMY N COX  Diagnosing Phys: Harold Hedge MD     Sonographer Comments: TDS due to bony thorax.  IMPRESSIONS    1. Left ventricular ejection fraction, by estimation, is 65 to 70%. The  left ventricle has normal function. The left  ventricle has no regional  wall motion abnormalities. Left ventricular diastolic parameters were  normal.  2. Right ventricular systolic function is normal. The right ventricular  size is mildly enlarged.  3. Left atrial size was mildly dilated.  4. Right atrial size was mildly dilated.  5. The mitral valve is grossly normal. Mild to moderate mitral valve  regurgitation.  6. The aortic valve is calcified. Aortic valve regurgitation is trivial.  Mild to moderate aortic valve sclerosis/calcification is present, without  any evidence of aortic stenosis.   FINDINGS  Left Ventricle: Left ventricular ejection fraction, by estimation, is 65  to 70%. The left ventricle has normal function. The left ventricle has no  regional wall motion  abnormalities. The left ventricular internal cavity  size was normal in size. There is  borderline left ventricular hypertrophy. Left ventricular diastolic  parameters were normal.   Right Ventricle: The right ventricular size is mildly enlarged. No  increase in right ventricular wall thickness. Right ventricular systolic  function is normal.   Left Atrium: Left atrial size was mildly dilated.   Right Atrium: Right atrial size was mildly dilated.   Pericardium: There is no evidence of pericardial effusion.   Mitral Valve: The mitral valve is grossly normal. Mild to moderate mitral  valve regurgitation. MV peak gradient, 3.1 mmHg. The mean mitral valve  gradient is 1.0 mmHg.   Tricuspid Valve: The tricuspid valve is not well visualized. Tricuspid  valve regurgitation is trivial.   Aortic Valve: The aortic valve is calcified. Aortic valve regurgitation is  trivial. Mild to moderate aortic valve sclerosis/calcification is present,  without any evidence of aortic stenosis. Aortic valve mean gradient  measures 3.0 mmHg. Aortic valve peak  gradient measures 5.9 mmHg. Aortic valve area, by VTI measures 1.71 cm.   Pulmonic Valve: The pulmonic valve was not well visualized. Pulmonic valve  regurgitation is trivial.   Aorta: The aortic root is normal in size and structure.   IAS/Shunts: No atrial level shunt detected by color flow Doppler.   Additional Comments: A pacer wire is visualized.     LEFT VENTRICLE  PLAX 2D  LVIDd:     3.50 cm  LVIDs:     2.10 cm  LV PW:     1.10 cm  LV IVS:    0.80 cm  LVOT diam:   2.20 cm  LV SV:     38  LV SV Index:  26  LVOT Area:   3.80 cm     RIGHT VENTRICLE  RV Basal diam: 4.80 cm   LEFT ATRIUM       Index    RIGHT ATRIUM      Index  LA diam:    4.10 cm 2.87 cm/m RA Area:   30.00 cm  LA Vol (A2C):  40.4 ml 28.28 ml/m RA Volume:  112.00 ml 78.39 ml/m  LA Vol (A4C):  92.3 ml 64.61  ml/m  LA Biplane Vol: 65.3 ml 45.71 ml/m  AORTIC VALVE          PULMONIC VALVE  AV Area (Vmax):  1.97 cm  PV Vmax:    0.68 m/s  AV Area (Vmean):  1.92 cm  PV Vmean:   42.900 cm/s  AV Area (VTI):   1.71 cm  PV VTI:    0.128 m  AV Vmax:      121.00 cm/s PV Peak grad: 1.9 mmHg  AV Vmean:     79.000 cm/s PV Mean grad: 1.0 mmHg  AV VTI:      0.220 m  AV Peak Grad:   5.9 mmHg  AV Mean Grad:   3.0 mmHg  LVOT Vmax:     62.70 cm/s  LVOT Vmean:    40.000 cm/s  LVOT VTI:     0.099 m  LVOT/AV VTI ratio: 0.45    AORTA  Ao Root diam: 3.50 cm   MITRAL VALVE  MV Area (PHT): 6.96 cm  SHUNTS  MV Area VTI:  2.36 cm  Systemic VTI: 0.10 m  MV Peak grad: 3.1 mmHg  Systemic Diam: 2.20 cm  MV Mean grad: 1.0 mmHg  MV Vmax:    0.88 m/s  MV Vmean:   47.8 cm/s  MV Decel Time: 109 msec  MV E velocity: 75.80 cm/s  MV A velocity: 36.80 cm/s  MV E/A ratio: 2.06   Harold Hedge MD  Electronically signed by Harold Hedge MD  Signature Date/Time: 09/30/2020/11:36:34 AM       ASSESSMENT/PLAN    Chronic pulmonary effusions -Patient is not in heart failure per most recent TTE but he does have chronic cor pulmonale and anasarca per CT -US liver - RV failure -he has mild aki today buy in general his GFR is within reference range when corrected for age.  -Nephrology on case appreciate input - midodrine , lasix fluid balance -thus far pleural fluid showing borderline exudate lymphocyte predominant which likely reflects chronic effusion.  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    Patient/Family are satisfied with care plan and all questions have been answered.  This document was prepared using Dragon voice recognition software and may include unintentional dictation errors.     Vida Rigger, M.D.  Division of Pulmonary & Critical Care Medicine  [facility]

## 2020-10-04 NOTE — Progress Notes (Signed)
OT Cancellation Note  Patient Details Name: Patrick Mccormick MRN: [id number] DOB: [birth year]   Cancelled Treatment:    Reason Eval/Treat Not Completed: Medical issues which prohibited therapy.  Chart reviewed - pt noted to have K+ critically high at 5.6; contraindicated for exertional activity at this time. Palliative consult pending. Pt has been held x5 days for OT evaluation 2/2 medical issues. Will complete orders at this time. Please reconsult when pt is appropriate for therapeutic intervention. Thank you.   Kathie Dike, M.S. OTR/L  10/04/20, 8:46 AM  ascom [phone]

## 2020-10-04 NOTE — Plan of Care (Signed)
Pt Axox4. Calm and cooperative and able to voice his needs. Pt currently on 1 L [place]. O2 sat 100%. Pt bleeding at IV insertion site and large bruise noted at right [facility] area. Pt on Eliquis.  Vitals being monitored closely. Safety measures in place. Will continue to monitor.   Problem: Education: Goal: Knowledge of General Education information will improve Description: Including pain rating scale, medication(s)/side effects and non-pharmacologic comfort measures Outcome: Progressing   Problem: Health Behavior/Discharge Planning: Goal: Ability to manage health-related needs will improve Outcome: Progressing   Problem: Clinical Measurements: Goal: Ability to maintain clinical measurements within normal limits will improve Outcome: Progressing Goal: Will remain free from infection Outcome: Progressing Goal: Diagnostic test results will improve Outcome: Progressing Goal: Respiratory complications will improve Outcome: Progressing Goal: Cardiovascular complication will be avoided Outcome: Progressing   Problem: Activity: Goal: Risk for activity intolerance will decrease Outcome: Progressing   Problem: Nutrition: Goal: Adequate nutrition will be maintained Outcome: Progressing   Problem: Coping: Goal: Level of anxiety will decrease Outcome: Progressing   Problem: Elimination: Goal: Will not experience complications related to bowel motility Outcome: Progressing Goal: Will not experience complications related to urinary retention Outcome: Progressing   Problem: Pain Managment: Goal: General experience of comfort will improve Outcome: Progressing   Problem: Safety: Goal: Ability to remain free from injury will improve Outcome: Progressing   Problem: Skin Integrity: Goal: Risk for impaired skin integrity will decrease Outcome: Progressing

## 2020-10-04 NOTE — Progress Notes (Signed)
[facility] [place], [place] 10/04/20  Subjective:   Patrick Mccormick is a 87 y.o. male with PMHX of pulmonary HTN, HTN, hx of SIADH, persistent atrial fibrillation, iron deficiency anemia and gastrectomy. He presented to the ED with complaints of shortness of breath.   He was admitted with respiratory failure including pleural effusion vs pulmonary embolism vs heart failure exacerbation.   Patient seen this morning during breakfast He was resting quietly in bed Denies shortness of breath and chest pain Awake for short periods.     Objective:  Vital signs in last 24 hours:  Temp:  [97.4 F (36.3 C)-99.4 F (37.4 C)] 97.7 F (36.5 C) (02/15 1526) Pulse Rate:  [59-62] 60 (02/15 1526) Resp:  [15-18] 15 (02/15 1526) BP: (91-112)/(53-72) 112/72 (02/15 1526) SpO2:  [100 %] 100 % (02/15 1526) Weight:  [39.4 kg] 39.4 kg (02/15 0358)  Weight change: 0.227 kg Filed Weights   10/02/20 0344 10/03/20 0501 10/04/20 0358  Weight: 38.7 kg 39.1 kg 39.4 kg    Intake/Output:    Intake/Output Summary (Last 24 hours) at 10/04/2020 1529 Last data filed at 10/04/2020 1142 Gross per 24 hour  Intake 160 ml  Output 0 ml  Net 160 ml   Physical Exam: General: Cachectic. Laying in bed  Head: Normocephalic, atraumatic. Dry oral mucosal membranes  Eyes: Anicteric  Neck: Supple, trachea midline  Lungs:  Bilateral rhonchi and +wheezes  On 2 L [place] O2  Heart: Regular rate and rhythm  Abdomen:  Soft, nontender,   Extremities: no peripheral edema.  Neurologic: Nonfocal, moving all four extremities  Skin: No lesions    Basic Metabolic Panel:  Recent Labs  Lab 10/01/20 0432 10/01/20 1645 10/02/20 0444 10/03/20 0409 10/04/20 0502 10/04/20 1353  NA 122* 121* 120* 120* 123*  --   K 4.3 4.5 4.7 5.2* 5.6* 5.3*  CL 89* 90* 89* 89* 88*  --   CO2 24 23 24 22 25   --   GLUCOSE 89 94 148* 133* 122*  --   BUN 40* 46* 51* 70* 87*  --   CREATININE 1.71* 1.81* 2.28* 3.02* 3.77*  --    CALCIUM 9.0 9.0 9.1 9.5 10.2  --      CBC: Recent Labs  Lab 09/29/20 1102 09/30/20 0432 10/01/20 0432 10/02/20 0444 10/03/20 0409  WBC 4.0 3.8* 4.7 5.2 7.7  NEUTROABS 2.8  --   --   --   --   HGB 11.4* 10.2* 9.8* 9.0* 9.6*  HCT 33.1* 28.4* 28.8* 25.9* 27.4*  MCV 93.8 91.0 94.4 94.9 94.5  PLT 157 128* 130* 134* 148*     No results found for: HEPBSAG, HEPBSAB, HEPBIGM    Microbiology:  Recent Results (from the past 240 hour(s))  Resp Panel by RT-PCR (Flu A&B, Covid) Nasopharyngeal Swab     Status: None   Collection Time: 09/29/20 11:02 AM   Specimen: Nasopharyngeal Swab; Nasopharyngeal(NP) swabs in vial transport medium  Result Value Ref Range Status   SARS Coronavirus 2 by RT PCR NEGATIVE NEGATIVE Final    Comment: (NOTE) SARS-CoV-2 target nucleic acids are NOT DETECTED.  The SARS-CoV-2 RNA is generally detectable in upper respiratory specimens during the acute phase of infection. The lowest concentration of SARS-CoV-2 viral copies this assay can detect is 138 copies/mL. A negative result does not preclude SARS-Cov-2 infection and should not be used as the sole basis for treatment or other patient management decisions. A negative result may occur with  improper specimen collection/handling, submission  of specimen other than nasopharyngeal swab, presence of viral mutation(s) within the areas targeted by this assay, and inadequate number of viral copies(<138 copies/mL). A negative result must be combined with clinical observations, patient history, and epidemiological information. The expected result is Negative.  Fact Sheet for Patients:  BloggerCourse.com  Fact Sheet for Healthcare Providers:  SeriousBroker.it  This test is no t yet approved or cleared by the [place] FDA and  has been authorized for detection and/or diagnosis of SARS-CoV-2 by FDA under an Emergency Use Authorization (EUA). This EUA will  remain  in effect (meaning this test can be used) for the duration of the COVID-19 declaration under Section 564(b)(1) of the Act, 21 U.S.C.section 360bbb-3(b)(1), unless the authorization is terminated  or revoked sooner.       Influenza A by PCR NEGATIVE NEGATIVE Final   Influenza B by PCR NEGATIVE NEGATIVE Final    Comment: (NOTE) The Xpert Xpress SARS-CoV-2/FLU/RSV plus assay is intended as an aid in the diagnosis of influenza from Nasopharyngeal swab specimens and should not be used as a sole basis for treatment. Nasal washings and aspirates are unacceptable for Xpert Xpress SARS-CoV-2/FLU/RSV testing.  Fact Sheet for Patients: BloggerCourse.com  Fact Sheet for Healthcare Providers: SeriousBroker.it  This test is not yet approved or cleared by the [place] FDA and has been authorized for detection and/or diagnosis of SARS-CoV-2 by FDA under an Emergency Use Authorization (EUA). This EUA will remain in effect (meaning this test can be used) for the duration of the COVID-19 declaration under Section 564(b)(1) of the Act, 21 U.S.C. section 360bbb-3(b)(1), unless the authorization is terminated or revoked.  Performed at [facility], [address] [place], [place] [address]   MRSA PCR Screening     Status: None   Collection Time: 09/29/20  6:41 PM   Specimen: Nasopharyngeal  Result Value Ref Range Status   MRSA by PCR NEGATIVE NEGATIVE Final    Comment:        The GeneXpert MRSA Assay (FDA approved for NASAL specimens only), is one component of a comprehensive MRSA colonization surveillance program. It is not intended to diagnose MRSA infection nor to guide or monitor treatment for MRSA infections. Performed at [facility], [address] [place], [place] [address]   Body fluid culture     Status: None   Collection Time: 09/30/20 12:00 PM   Specimen: PATH Cytology Pleural fluid  Result  Value Ref Range Status   Specimen Description   Final    PLEURAL Performed at [facility], [address], [place], [place] [address]    Special Requests   Final    NONE Performed at [facility], [address] [place], [place] [address]    Gram Stain NO WBC SEEN NO ORGANISMS SEEN   Final   Culture   Final    NO GROWTH 3 DAYS Performed at [facility], [address] [place], [place] [address]    Report Status 10/03/2020 FINAL  Final  Fungus Culture With Stain     Status: None (Preliminary result)   Collection Time: 09/30/20 12:00 PM   Specimen: PATH Cytology Pleural fluid  Result Value Ref Range Status   Fungus Stain Final report  Final    Comment: (NOTE) Performed At: [facility] [address] [place], [place] [id number] Jolene Schimke MD HQ:[id number]    Fungus (Mycology) Culture PENDING  Incomplete   Fungal Source PLEURAL  Final    Comment:  Performed at [facility], [address] [place], [place] [address]  Acid Fast Smear (AFB)     Status: None   Collection Time: 09/30/20 12:00 PM   Specimen: PATH Cytology Pleural fluid  Result Value Ref Range Status   AFB Specimen Processing Concentration  Final   Acid Fast Smear Negative  Final    Comment: (NOTE) Performed At: [facility] [address] [place], [place] [id number] Jolene Schimke MD XB:[id number]    Source (AFB) PLEURAL  Final    Comment: Performed at [facility], [address] [place], [place] [address]  Fungus Culture Result     Status: None   Collection Time: 09/30/20 12:00 PM  Result Value Ref Range Status   Result 1 Comment  Final    Comment: (NOTE) KOH/Calcofluor preparation:  no fungus observed. Performed At: [facility] [address] [place], [place] [id number] Jolene Schimke MD MW:[id number]   Body fluid culture w Gram Stain     Status: None (Preliminary result)   Collection Time: 10/03/20  3:40 PM   Specimen:  PATH Cytology Pleural fluid  Result Value Ref Range Status   Specimen Description   Final    PLEURAL Performed at [facility], [address] [place], [place] [address]    Special Requests   Final    PLEURAL Performed at [facility], [address] [place], [place] [address]    Gram Stain   Final    NO WBC SEEN NO ORGANISMS SEEN Performed at [facility], [address] [place], [place] [address]    Culture PENDING  Incomplete   Report Status PENDING  Incomplete    Coagulation Studies: No results for input(s): LABPROT, INR in the last 72 hours.  Urinalysis: No results for input(s): COLORURINE, LABSPEC, PHURINE, GLUCOSEU, HGBUR, BILIRUBINUR, KETONESUR, PROTEINUR, UROBILINOGEN, NITRITE, LEUKOCYTESUR in the last 72 hours.  Invalid input(s): APPERANCEUR    Imaging: DG Chest 2 View  Result Date: 10/03/2020 CLINICAL DATA:  Acute renal injury EXAM: CHEST - 2 VIEW COMPARISON:  10/02/2020 FINDINGS: LEFT-sided pacemaker overlies stable cardiac silhouette. Large bilateral loculated pleural effusions. The LEFT effusion appears slightly decreased. No pneumothorax. Calcified pleural plaque over the LEFT hemidiaphragm. IMPRESSION: 1. Bilateral large loculated pleural effusions. 2. LEFT effusion may be slightly reduced. 3. No pulmonary edema or pneumothorax. Electronically Signed   By: Genevive Bi M.D.   On: 10/03/2020 10:23   US RENAL  Result Date: 10/03/2020 CLINICAL DATA:  Acute kidney injury in a 87 year old male EXAM: RENAL / URINARY TRACT ULTRASOUND COMPLETE COMPARISON:  Abdomen and pelvis CT from 2022, February 2022 FINDINGS: Right Kidney: Renal measurements: 9.0 x 4.1 x 5.7 cm = volume: 110 mL. Slight increased echogenicity of renal cortex. Cyst in the interpolar aspect of the RIGHT kidney 2.5 x 2.4 x 1.4 cm. No hydronephrosis. Left Kidney: Renal measurements: 9.6 x 4.8 x 4.8 cm = volume: 114 mL. Mild increased echogenicity the without hydronephrosis or  focal lesion aside from a small cyst in the lower pole. Bladder: Under distended urinary bladder with small volume fluid in the pelvis, small volume fluid in the pelvis seen on prior imaging Other: Small volume ascites in the pelvis as discussed. IMPRESSION: Mild increased cortical echogenicity as can be seen in the setting of medical renal disease. No hydronephrosis. Small volume pelvic ascites. Electronically Signed   By: Donzetta Kohut M.D.   On: 10/03/2020 10:52   DG Chest Port 1 View  Result Date:  10/04/2020 CLINICAL DATA:  Prior right thoracentesis. EXAM: PORTABLE CHEST 1 VIEW COMPARISON:  10/03/2020.  10/02/2020. FINDINGS: AICD noted stable position. Stable cardiomegaly. Bilateral interstitial infiltrates/edema cannot be excluded. Right pleural effusion is stable. Stable loculated left pleural effusion. Left base calcified pleural plaques again noted. No pneumothorax noted on today's exam. IMPRESSION: 1. Stable right pleural effusion. No pneumothorax noted on today's exam. Stable loculated left pleural effusion. 2. AICD noted stable position. Stable cardiomegaly. Bilateral interstitial edema/infiltrates cannot be excluded. Electronically Signed   By: Maisie Fus  Register   On: 10/04/2020 08:24   DG Chest Port 1 View  Result Date: 10/03/2020 CLINICAL DATA:  Status post thoracentesis. EXAM: PORTABLE CHEST 1 VIEW COMPARISON:  Same day. FINDINGS: Stable cardiomediastinal silhouette. Right pleural effusion is significantly smaller status post thoracentesis. Possible small right apical pneumothorax is noted which most likely represents ex vacuo pneumothorax. Stable right lung opacity is noted. IMPRESSION: Right pleural effusion is significantly smaller status post thoracentesis. Possible small right apical pneumothorax is noted which most likely represents ex vacuo pneumothorax. Stable right lung opacity. Electronically Signed   By: Lupita Raider M.D.   On: 10/03/2020 16:10   DG Chest Port 1 View  Result  Date: 10/02/2020 CLINICAL DATA:  Fatigue EXAM: PORTABLE CHEST 1 VIEW COMPARISON:  09/30/2020 FINDINGS: There are persistent bilateral loculated pleural effusions which appear to have increased on the right but are stable on the left. Pleural base calcifications are again noted. There is no pneumothorax. The heart size is stable. Aortic calcifications are noted. There is a biventricular pacemaker in place. There is no acute osseous abnormality. IMPRESSION: Persistent bilateral loculated pleural effusions, increased on the right but stable on the left. Electronically Signed   By: Katherine Mantle M.D.   On: 10/02/2020 18:43   US THORACENTESIS ASP PLEURAL SPACE W/IMG GUIDE  Result Date: 10/04/2020 INDICATION: Patient with history of pulmonary hypertension and persistent AFib. Found to have a recurrent right-sided pleural effusion. Team is requesting therapeutic and diagnostic thoracentesis EXAM: ULTRASOUND GUIDED THERAPEUTIC AND DIAGNOSTIC THORACENTESIS MEDICATIONS: Lidocaine 1% 10 mL COMPLICATIONS: None immediate. PROCEDURE: An ultrasound guided thoracentesis was thoroughly discussed with the patient and questions answered. The benefits, risks, alternatives and complications were also discussed. The patient understands and wishes to proceed with the procedure. Written consent was obtained. Ultrasound was performed to localize and mark an adequate pocket of fluid in the right chest. The area was then prepped and draped in the normal sterile fashion. 1% Lidocaine was used for local anesthesia. Under ultrasound guidance a 6 Fr Safe-T-Centesis catheter was introduced. Thoracentesis was performed. The catheter was removed and a dressing applied. FINDINGS: A total of approximately 350 mL of amber colored fluid was removed. Samples were sent to the laboratory as requested by the clinical team. IMPRESSION: Successful ultrasound guided therapeutic and diagnostic right sided thoracentesis yielding 350 mL of pleural fluid.  Read by: Anders Grant, NP Electronically Signed   By: Acquanetta Belling M.D.   On: 10/03/2020 16:35     Medications:   . furosemide (LASIX) 200 mg in dextrose 5% 100 mL (2mg /mL) infusion    . milrinone     . allopurinol  100 mg Oral Daily  . apixaban  10 mg Oral BID   Followed by  . [START ON 10/07/2020] apixaban  5 mg Oral BID  . vitamin C  250 mg Oral BID  . atorvastatin  20 mg Oral Daily  . feeding supplement  237 mL Oral TID BM  . hydrocortisone  sod succinate (SOLU-CORTEF) inj  50 mg Intravenous Q6H  . levothyroxine  25 mcg Oral Q0600  . midodrine  10 mg Oral QID  . mirtazapine  7.5 mg Oral QHS  . multivitamin with minerals  1 tablet Oral Daily  . sodium zirconium cyclosilicate  10 g Oral BID   metoprolol tartrate, ondansetron **OR** ondansetron (ZOFRAN) IV, sodium chloride  Assessment/ Plan:  87 y.o. male with PMHX of pulmonary HTN, HTN, hx of SIADH, persistent atrial fibrillation, iron deficiency anemia and gastrectomy. was admitted on 09/29/2020   Principal Problem:   Respiratory distress Active Problems:   Pleural effusion due to congestive heart failure (HCC)   Pulmonary hypertension (HCC)   Atrial fibrillation, chronic (HCC)   History of partial gastrectomy   Protein-calorie malnutrition, severe  Shortness of breath [R06.02] Hyponatremia [E87.1] Pleural effusion [J90] Pleural effusion due to congestive heart failure (HCC) [I50.9] Acute respiratory failure with hypoxia (HCC) [J96.01]  #. Hyponatremia -Baseline appears to be 126-135 -positive history of SIADH and hyponatremia -Improved Na level-123 -Continue 1.2L fluid restriction and hold diuretics -continue Lokelma ordered daily   #. Anemia of CKD  Lab Results  Component Value Date   HGB 9.6 (L) 10/03/2020    Will monitor for continued improvement  #. Acute kidney injury on CKD stage 3B -baseline creatinine 1.48 on 06/22/20. -No obstruction on CT abd/pelvis  # Hypotension Midodrine started on  10/01/20 BP stable 100s/60s   LOS: 4 Wendee BeaversShantelle Breeze, NP 2/15/20223:29 PM  [facility] [place], [place] [phone] [phone]

## 2020-10-04 NOTE — Consult Note (Signed)
[facility] Vascular Consult Note  MRN : [id number]  Patrick Mccormick is a 87 y.o. (August 24, 1931) male who presents with chief complaint of as of breath  History of Present Illness:  Patrick Mccormick is a 87 y.o. male with medical history significant for pulmonary hypertension, hypertension, history of SIADH, unintended weight loss, history of gastrectomy, persistent atrial fibrillation, iron deficiency anemia, presented to the emergency department for chief concerns of shortness of breath.   At baseline, patient is not on home oxygen. He endorses shortness of breath x one week. He has unintentionally lost about 40 lbs in 1.5 years (150ish to 110) and extreme exhaustion and does not get hungry. He reports poor appetite.  He further endorses new watery diarrhea, three times per day for one week. He denies blood. He endorses history of persistent right pleural effusion status post thoracentesis in which he had about two liters removed from only his right lungs about two years ago.   CTA Chest (09/29/20): Web-like filling defects in the distal left main pulmonary artery and extending into the lingular branches may reflect subacute to chronic pulmonary embolism with acute pulmonary embolism less favored. More antidependent incomplete opacification of the left lower lobe pulmonary arteries could reflect laminar flow artifact though underlying embolism is not excluded. Evaluation for right heart strain is precluded in the setting of diffuse cardiomegaly and four-chamber cardiac enlargement though there is evidence of elevated right heart pressures and right-sided dysfunction with enlarged pulmonary arteries and refluxing contrast into the hepatic veins and IVC. Consider further characterization with echocardiography as clinically warranted.  Vascular surgery was consulted by Dr. Karna Christmas of for possible endovascular intervention.  Current Facility-Administered Medications   Medication Dose Route Frequency Provider Last Rate Last Admin  . allopurinol (ZYLOPRIM) tablet 100 mg  100 mg Oral Daily Cox, Amy N, DO   100 mg at 10/04/20 0909  . apixaban (ELIQUIS) tablet 10 mg  10 mg Oral BID [name], MD   10 mg at 10/04/20 4098   Followed by  . [START ON 10/07/2020] apixaban (ELIQUIS) tablet 5 mg  5 mg Oral BID [name], MD      . ascorbic acid (VITAMIN C) tablet 250 mg  250 mg Oral BID [name], MD   250 mg at 10/04/20 0909  . atorvastatin (LIPITOR) tablet 20 mg  20 mg Oral Daily Cox, Amy N, DO   20 mg at 10/04/20 0909  . feeding supplement (ENSURE ENLIVE / ENSURE PLUS) liquid 237 mL  237 mL Oral TID BM [name], MD   237 mL at 10/04/20 1250  . furosemide (LASIX) 200 mg in dextrose 5 % 100 mL (2 mg/mL) infusion  4 mg/hr Intravenous Continuous [name], MD      . hydrocortisone sodium succinate (SOLU-CORTEF) injection 50 mg  50 mg Intravenous Q6H [name], MD   50 mg at 10/04/20 1249  . levothyroxine (SYNTHROID) tablet 25 mcg  25 mcg Oral Q0600 [name], MD   25 mcg at 10/04/20 0526  . metoprolol tartrate (LOPRESSOR) injection 5 mg  5 mg Intravenous Q4H PRN Cox, Amy N, DO      . midodrine (PROAMATINE) tablet 10 mg  10 mg Oral QID Manuela Schwartz, NP   10 mg at 10/04/20 1250  . milrinone (PRIMACOR) 20 MG/100 ML (0.2 mg/mL) infusion  0.25 mcg/kg/min Intravenous Continuous [name], MD      . mirtazapine (REMERON) tablet 7.5 mg  7.5 mg Oral QHS Cox, Amy N,  DO   7.5 mg at 10/03/20 2019  . multivitamin with minerals tablet 1 tablet  1 tablet Oral Daily [name], MD   1 tablet at 10/04/20 0910  . ondansetron (ZOFRAN) tablet 4 mg  4 mg Oral Q6H PRN Cox, Amy N, DO       Or  . ondansetron (ZOFRAN) injection 4 mg  4 mg Intravenous Q6H PRN Cox, Amy N, DO   4 mg at 10/03/20 1633  . sodium chloride (OCEAN) 0.65 % nasal spray 1 spray  1 spray Each Nare PRN [name], MD      . sodium zirconium cyclosilicate (LOKELMA) packet 10 g  10 g  Oral BID [name], MD   10 g at 10/04/20 4098   History reviewed. No pertinent past medical history.  History reviewed. No pertinent surgical history.  Social History Social History   Tobacco Use  . Smoking status: Never Smoker  . Smokeless tobacco: Never Used   Family History History reviewed. No pertinent family history.  Denies peripheral arteries, venous disease or bleeding/clotting disorders.  No Known Allergies  REVIEW OF SYSTEMS (Negative unless checked)  Constitutional: Weight loss  Fever  Chills Cardiac: Chest pain   Chest pressure   Palpitations   Shortness of breath when laying flat   Shortness of breath at rest   Shortness of breath with exertion. Vascular:  Pain in legs with walking   Pain in legs at rest   Pain in legs when laying flat   Claudication   Pain in feet when walking  Pain in feet at rest  Pain in feet when laying flat   History of DVT   Phlebitis   Swelling in legs   Varicose veins   Non-healing ulcers Pulmonary:   Uses home oxygen   Productive cough   Hemoptysis   Wheeze  COPD   Asthma Neurologic:  Dizziness  Blackouts   Seizures   History of stroke   History of TIA  Aphasia   Temporary blindness   Dysphagia   Weakness or numbness in arms   Weakness or numbness in legs Musculoskeletal:  Arthritis   Joint swelling   Joint pain   Low back pain Hematologic:  Easy bruising  Easy bleeding   Hypercoagulable state   Anemic  Hepatitis Gastrointestinal:  Blood in stool   Vomiting blood  Gastroesophageal reflux/heartburn   Difficulty swallowing. Genitourinary:  Chronic kidney disease   Difficult urination  Frequent urination  Burning with urination   Blood in urine Skin:  Rashes   Ulcers   Wounds Psychological:  History of anxiety    History of major depression.  Physical Examination  Vitals:   10/03/20 1959 10/04/20 0358 10/04/20 0840  10/04/20 1300  BP: (!) 91/56 (!) 99/53 104/64 103/64  Pulse: 60 62 60 (!) 59  Resp: Temp: 97.8 F (36.6 C) 98.6 F (37 C) 99.4 F (37.4 C) (!) 97.4 F (36.3 C)  TempSrc: Oral  Oral Oral  SpO2: 100% 100% 100% 100%  Weight:  39.4 kg    Height:       Body mass index is 13.59 kg/m. Gen:  WD/WN, NAD Head: [place]/AT, No temporalis wasting. Prominent temp pulse not noted. Ear/Nose/Throat: Hearing grossly intact, nares w/o erythema or drainage, oropharynx w/o Erythema/Exudate Eyes: Sclera non-icteric, conjunctiva clear Neck: Trachea midline.  No JVD.  Pulmonary:  Good air movement, respirations not labored, equal bilaterally.  Patient able to speak without becoming short of breath.  On nasal cannula. Cardiac: RRR, normal S1, S2. Vascular:  Vessel Right Left  Radial Palpable Palpable  Ulnar Palpable Palpable                               Gastrointestinal: soft, non-tender/non-distended. No guarding/reflex.  Musculoskeletal: M/S 5/5 throughout.  Extremities without ischemic changes.  No deformity or atrophy. No edema. Neurologic: Sensation grossly intact in extremities.  Symmetrical.  Speech is fluent. Motor exam as listed above. Psychiatric: Judgment intact, Mood & affect appropriate for pt's clinical situation. Dermatologic: No rashes or ulcers noted.  No cellulitis or open wounds. Lymph : No Cervical, Axillary, or Inguinal lymphadenopathy.  CBC Lab Results  Component Value Date   WBC 7.7 10/03/2020   HGB 9.6 (L) 10/03/2020   HCT 27.4 (L) 10/03/2020   MCV 94.5 10/03/2020   PLT 148 (L) 10/03/2020   BMET    Component Value Date/Time   NA 123 (L) 10/04/2020 0502   K 5.3 (H) 10/04/2020 1353   CL 88 (L) 10/04/2020 0502   CO2 25 10/04/2020 0502   GLUCOSE 122 (H) 10/04/2020 0502   BUN 87 (H) 10/04/2020 0502   CREATININE 3.77 (H) 10/04/2020 0502   CALCIUM 10.2 10/04/2020 0502   GFRNONAA 15 (L) 10/04/2020 0502   Estimated Creatinine Clearance: 7.8 mL/min (A)  (by C-G formula based on SCr of 3.77 mg/dL (H)).  COAG Lab Results  Component Value Date   INR 1.6 (H) 09/29/2020   Radiology DG Chest 2 View  Result Date: 10/03/2020 CLINICAL DATA:  Acute renal injury EXAM: CHEST - 2 VIEW COMPARISON:  10/02/2020 FINDINGS: LEFT-sided pacemaker overlies stable cardiac silhouette. Large bilateral loculated pleural effusions. The LEFT effusion appears slightly decreased. No pneumothorax. Calcified pleural plaque over the LEFT hemidiaphragm. IMPRESSION: 1. Bilateral large loculated pleural effusions. 2. LEFT effusion may be slightly reduced. 3. No pulmonary edema or pneumothorax. Electronically Signed   By: Genevive Bi M.D.   On: 10/03/2020 10:23   CT Angio Chest PE W and/or Wo Contrast  Addendum Date: 09/29/2020   ADDENDUM REPORT: 09/29/2020 15:35 ADDENDUM: These results were called by telephone at the time of interpretation on 09/29/2020 at 3:35 pm to provider AMY COX , who verbally acknowledged these results. Electronically Signed   By: Kreg Shropshire M.D.   On: 09/29/2020 15:35   Result Date: 09/29/2020 CLINICAL DATA:  Dyspnea EXAM: CT ANGIOGRAPHY CHEST WITH CONTRAST TECHNIQUE: Multidetector CT imaging of the chest was performed using the standard protocol during bolus administration of intravenous contrast. Multiplanar CT image reconstructions and MIPs were obtained to evaluate the vascular anatomy. CONTRAST:  75mL OMNIPAQUE IOHEXOL 350 MG/ML SOLN COMPARISON:  Radiograph 09/29/2020 FINDINGS: Cardiovascular: Satisfactory opacification of pulmonary arteries. Some thin linear web-like bands of hypoattenuation in the distal left main pulmonary artery and lingular branches. Some more anti dependent hypoattenuation within the segmental pulmonary arteries of the left lower lobe have an appearance favored to be laminar flow and mixing artifact though a more subacute to remote pulmonary embolism is not fully excluded. Central pulmonary arteries are enlarged. There is  marked cardiomegaly with four-chamber enlargement. Three-vessel coronary artery atherosclerosis is present. Left chest wall pacer pack is noted with leads at the right atrium, coronary sinus and cardiac apex. Trace pericardial effusion. Suboptimal opacification of the aorta for luminal assessment. Atherosclerotic plaque within the normal caliber aorta. Normal 3 vessel branching of the aortic arch. Reflux of contrast in the hepatic veins and IVC.  Mediastinum/Nodes: Diffuse edematous changes throughout the mediastinum. Scattered low-attenuation mediastinal and hilar lymph nodes favored to be reactive/edematous. No concerning axillary adenopathy. Fluid-filled, patulous thoracic esophagus. Trachea is unremarkable. No concerning thyroid abnormality though poorly evaluated with superimposed streak artifact. Lungs/Pleura: Complex, bilateral loculated pleural effusions which are predominantly low-attenuation. There are regions of dense basilar pleural calcification in both lungs. A more diffuse pleural thickening is noted in the right lung as well. There are areas of adjacent passive atelectasis with additional more focal nodular atelectatic changes in the right lung (6/64). Could reflect some rounded atelectasis though underlying infection or disease is not fully excluded. Additional areas of bandlike reticular opacity with architectural distortion and scarring. Diffuse airways thickening and scattered secretions are noted as well. No pneumothorax. Upper Abdomen: Diffuse edematous changes noted throughout the upper abdomen and mesentery with small volume ascites. Extensive atherosclerotic plaque in the upper abdominal aorta. Calcified septation in the infrarenal abdominal aorta may reflect sequela of prior dissection or lamellar calcification of the aorta itself, incompletely assessed on this exam. Postsurgical changes from prior gastric surgery. Musculoskeletal: The osseous structures appear diffusely demineralized which  may limit detection of small or nondisplaced fractures. No acute osseous abnormality or suspicious osseous lesion. Degenerative changes are present in the imaged spine and shoulders. Diffuse body wall edema. Review of the MIP images confirms the above findings. IMPRESSION: 1. Web-like filling defects in the distal left main pulmonary artery and extending into the lingular branches may reflect subacute to chronic pulmonary embolism with acute pulmonary embolism less favored. More antidependent incomplete opacification of the left lower lobe pulmonary arteries could reflect laminar flow artifact though underlying embolism is not excluded. Evaluation for right heart strain is precluded in the setting of diffuse cardiomegaly and four-chamber cardiac enlargement though there is evidence of elevated right heart pressures and right-sided dysfunction with enlarged pulmonary arteries and refluxing contrast into the hepatic veins and IVC. Consider further characterization with echocardiography as clinically warranted. 2. Complex, bilateral loculated pleural effusions which are predominantly low-attenuation. Associated regions of dense basilar pleural calcification in both lungs. Findings can be on the spectrum of underlying asbestos related pleural disease though the sterility of these collections cannot be fully ascertained on an imaging basis. Extensive areas of passive atelectasis. More focal nodular atelectatic changes in the right lung as well. Could reflect some rounded atelectasis though underlying infection or disease is not fully excluded. Recommend attention on follow-up imaging. 3. Features of anasarca/volume overload with diffuse edematous changes throughout the mediastinum, mediastinum, upper abdomen, mesentery and body wall with small volume abdominal ascites. 4. Aortic Atherosclerosis (ICD10-I70.0). Currently attempting to contact the ordering provider with a critical value result. Addendum will be submitted  upon case discussion. Electronically Signed: By: Kreg Shropshire M.D. On: 09/29/2020 15:28   CT ABDOMEN PELVIS W CONTRAST  Result Date: 09/30/2020 CLINICAL DATA:  Unintentional weight loss EXAM: CT ABDOMEN AND PELVIS WITH CONTRAST TECHNIQUE: Multidetector CT imaging of the abdomen and pelvis was performed using the standard protocol following bolus administration of intravenous contrast. CONTRAST:  60mL OMNIPAQUE IOHEXOL 300 MG/ML  SOLN COMPARISON:  None. FINDINGS: Lower chest: Complex moderate right pleural effusion is again identified with associated pleural thickening. Small focus of extrapleural gas within this collection likely relates to recent thoracentesis. There is associated compressive atelectasis of the right lung base. Partially loculated left pleural effusion largely within the left major fissure appears stable since prior CT examination of 09/29/2020. Dense pleural calcifications at the left lung base likely relate to  remote trauma or inflammation. Mild global cardiomegaly with particular enlargement of the a right ventricle and right atrium are again identified. Pacemaker leads are seen within the right heart and left ventricular venous outflow. Extensive calcifications are noted within the coronary arteries. Hepatobiliary: Reflux of contrast into the hepatic venous system is in keeping with at least some degree of right heart failure. Tiny probable cyst within the inferior right hepatic lobe. Liver otherwise unremarkable. No intra or extrahepatic biliary ductal dilation. Gallbladder unremarkable. Pancreas: Unremarkable Spleen: Unremarkable Adrenals/Urinary Tract: The adrenal glands are unremarkable. The kidneys are normal in position. There is mild-to-moderate bilateral renal cortical atrophy noted. Bilateral simple cortical cysts are identified. The kidneys are otherwise unremarkable. The bladder is unremarkable. Stomach/Bowel: Surgical changes of a partial gastrectomy are identified. Moderate  sigmoid diverticulosis. The stomach, small bowel, and large bowel are otherwise unremarkable. No evidence of obstruction or focal inflammation. Appendix normal. No free intraperitoneal gas. Mild ascites is present. Vascular/Lymphatic: There is extensive aortoiliac atherosclerotic calcification with particularly prominent atherosclerotic calcification at the origin of the a renal and mesenteric arterial vasculature almost certainly resulting in hemodynamically significant stenoses, not well characterized on this examination. Short segment focal dissection within the infrarenal abdominal aorta, likely the result of a remote penetrating atherosclerotic ulcer results in mild ectasia of the infrarenal abdominal aorta without frank aneurysm formation, with a maximal transaxial dimension of 2.6 cm. The common iliac arteries are ectatic bilaterally, left greater than right, without frank aneurysm formation. Extensive atherosclerotic calcification is noted within the lower extremity arterial inflow and visualized outflow. Reproductive: Prostate is unremarkable. Other: There is extensive subcutaneous edema noted throughout the body wall as well as retroperitoneal edema in keeping with changes of anasarca. Musculoskeletal: Degenerative changes are seen within the lumbar spine. No suspicious lytic or blastic bone lesions are seen. Degenerative changes are noted within the hips bilaterally. No acute bone abnormality. IMPRESSION: Complex right hydropneumothorax with small gaseous component likely related to recent thoracentesis. Cardiomegaly and extensive coronary artery calcification. Large mint of the right heart and reflux of contrast into the hepatic venous system in keeping with at least some degree of right heart failure. Dense calcification at the left lung base likely result of remote trauma or inflammation. Diffuse body wall subcutaneous edema, mild ascites, and retroperitoneal edema most in keeping with moderate  anasarca, possibly the result of cardiogenic failure. Peripheral vascular disease with extensive atherosclerotic calcification at the origin of the mesenteric and renal vasculature almost certainly resulting in hemodynamically significant stenosis. Clinical correlation for signs and symptoms of chronic mesenteric ischemia and/or significant renal artery stenosis is recommended. Aortic Atherosclerosis (ICD10-I70.0). Electronically Signed   By: Helyn NumbersAshesh  Parikh MD   On: 09/30/2020 13:09   US RENAL  Result Date: 10/03/2020 CLINICAL DATA:  Acute kidney injury in a 87 year old male EXAM: RENAL / URINARY TRACT ULTRASOUND COMPLETE COMPARISON:  Abdomen and pelvis CT from 2022, February 2022 FINDINGS: Right Kidney: Renal measurements: 9.0 x 4.1 x 5.7 cm = volume: 110 mL. Slight increased echogenicity of renal cortex. Cyst in the interpolar aspect of the RIGHT kidney 2.5 x 2.4 x 1.4 cm. No hydronephrosis. Left Kidney: Renal measurements: 9.6 x 4.8 x 4.8 cm = volume: 114 mL. Mild increased echogenicity the without hydronephrosis or focal lesion aside from a small cyst in the lower pole. Bladder: Under distended urinary bladder with small volume fluid in the pelvis, small volume fluid in the pelvis seen on prior imaging Other: Small volume ascites in the pelvis as discussed. IMPRESSION:  Mild increased cortical echogenicity as can be seen in the setting of medical renal disease. No hydronephrosis. Small volume pelvic ascites. Electronically Signed   By: Donzetta Kohut M.D.   On: 10/03/2020 10:52   DG Chest Port 1 View  Result Date: 10/04/2020 CLINICAL DATA:  Prior right thoracentesis. EXAM: PORTABLE CHEST 1 VIEW COMPARISON:  10/03/2020.  10/02/2020. FINDINGS: AICD noted stable position. Stable cardiomegaly. Bilateral interstitial infiltrates/edema cannot be excluded. Right pleural effusion is stable. Stable loculated left pleural effusion. Left base calcified pleural plaques again noted. No pneumothorax noted on today's  exam. IMPRESSION: 1. Stable right pleural effusion. No pneumothorax noted on today's exam. Stable loculated left pleural effusion. 2. AICD noted stable position. Stable cardiomegaly. Bilateral interstitial edema/infiltrates cannot be excluded. Electronically Signed   By: Maisie Fus  Register   On: 10/04/2020 08:24   DG Chest Port 1 View  Result Date: 10/03/2020 CLINICAL DATA:  Status post thoracentesis. EXAM: PORTABLE CHEST 1 VIEW COMPARISON:  Same day. FINDINGS: Stable cardiomediastinal silhouette. Right pleural effusion is significantly smaller status post thoracentesis. Possible small right apical pneumothorax is noted which most likely represents ex vacuo pneumothorax. Stable right lung opacity is noted. IMPRESSION: Right pleural effusion is significantly smaller status post thoracentesis. Possible small right apical pneumothorax is noted which most likely represents ex vacuo pneumothorax. Stable right lung opacity. Electronically Signed   By: Lupita Raider M.D.   On: 10/03/2020 16:10   DG Chest Port 1 View  Result Date: 10/02/2020 CLINICAL DATA:  Fatigue EXAM: PORTABLE CHEST 1 VIEW COMPARISON:  09/30/2020 FINDINGS: There are persistent bilateral loculated pleural effusions which appear to have increased on the right but are stable on the left. Pleural base calcifications are again noted. There is no pneumothorax. The heart size is stable. Aortic calcifications are noted. There is a biventricular pacemaker in place. There is no acute osseous abnormality. IMPRESSION: Persistent bilateral loculated pleural effusions, increased on the right but stable on the left. Electronically Signed   By: Katherine Mantle M.D.   On: 10/02/2020 18:43   DG Chest Port 1 View  Result Date: 09/30/2020 CLINICAL DATA:  Status post right-sided thoracentesis. EXAM: PORTABLE CHEST 1 VIEW COMPARISON:  CT a chest in chest x-ray from yesterday. FINDINGS: Unchanged left chest wall pacemaker. Stable cardiomegaly and pulmonary  artery enlargement. Loculated bilateral pleural effusions again noted, slightly decreased on the right status post thoracentesis. No pneumothorax. Unchanged pleural calcification at the lung bases and bilateral lower lobe atelectasis. No acute osseous abnormality. IMPRESSION: 1. Loculated bilateral pleural effusions, slightly decreased on the right status post thoracentesis. No pneumothorax. Electronically Signed   By: Obie Dredge M.D.   On: 09/30/2020 12:34   DG Chest Portable 1 View  Result Date: 09/29/2020 CLINICAL DATA:  Dyspnea EXAM: PORTABLE CHEST 1 VIEW COMPARISON:  None. FINDINGS: Bilateral basilar laterally loculated pleural effusions are present, moderate on the right and small on the left. Left basilar pleural calcifications are identified. There is enlargement of the right hilum suggesting presence of right hilar mass or right hilar adenopathy. There is partial right lower lobe collapse. Opacity within the left perihilar region may represent fluid within the left major fissure. A left perihilar mass, however, is difficult to exclude. No pneumothorax. Mild cardiomegaly. Left subclavian 3 lead pacemaker in place. Vascular calcifications are seen within the abdominal aorta. The pulmonary vascularity is normal. No acute bone abnormality. IMPRESSION: Suspected right hilar mass or adenopathy with subtotal collapse of the right lower lobe possibly representing postobstructive collapse. Bilateral  loculated pleural effusions, moderate on the right and small on the left. Coarse left basilar pleural calcification may relate to remote trauma or infection. Left perihilar opacity possibly representing fluid within the fissure or a left perihilar focal pulmonary mass. This would be better assessed with dedicated contrast enhanced CT imaging. Mild cardiomegaly. Electronically Signed   By: Helyn Numbers MD   On: 09/29/2020 11:08   ECHOCARDIOGRAM COMPLETE  Result Date: 09/30/2020    ECHOCARDIOGRAM REPORT    Patient Name:   Patrick Mccormick Date of Exam: 09/30/2020 Medical Rec #:  [id number]       Height:       67.0 in Accession #:    [id number]      Weight:       88.3 lb Date of Birth:  1932-05-16       BSA:          1.429 m Patient Age:    88 years        BP:           87/50 mmHg Patient Gender: M               HR:           76 bpm. Exam Location:  [facility] Procedure: 2D Echo, Color Doppler and Cardiac Doppler Indications:     R06.00 Dyspnea  History:         Patient has no prior history of Echocardiogram examinations.                  Arrythmias:Paroxymal atrial fibrillation,                  Signs/Symptoms:Shortness of Breath; Risk Factors:Hypertension.  Sonographer:     Humphrey Rolls RDCS (AE) Referring Phys:  [id number] AMY N COX Diagnosing Phys: Harold Hedge MD  Sonographer Comments: TDS due to bony thorax. IMPRESSIONS  1. Left ventricular ejection fraction, by estimation, is 65 to 70%. The left ventricle has normal function. The left ventricle has no regional wall motion abnormalities. Left ventricular diastolic parameters were normal.  2. Right ventricular systolic function is normal. The right ventricular size is mildly enlarged.  3. Left atrial size was mildly dilated.  4. Right atrial size was mildly dilated.  5. The mitral valve is grossly normal. Mild to moderate mitral valve regurgitation.  6. The aortic valve is calcified. Aortic valve regurgitation is trivial. Mild to moderate aortic valve sclerosis/calcification is present, without any evidence of aortic stenosis. FINDINGS  Left Ventricle: Left ventricular ejection fraction, by estimation, is 65 to 70%. The left ventricle has normal function. The left ventricle has no regional wall motion abnormalities. The left ventricular internal cavity size was normal in size. There is  borderline left ventricular hypertrophy. Left ventricular diastolic parameters were normal. Right Ventricle: The right ventricular size is mildly enlarged. No increase in right ventricular wall  thickness. Right ventricular systolic function is normal. Left Atrium: Left atrial size was mildly dilated. Right Atrium: Right atrial size was mildly dilated. Pericardium: There is no evidence of pericardial effusion. Mitral Valve: The mitral valve is grossly normal. Mild to moderate mitral valve regurgitation. MV peak gradient, 3.1 mmHg. The mean mitral valve gradient is 1.0 mmHg. Tricuspid Valve: The tricuspid valve is not well visualized. Tricuspid valve regurgitation is trivial. Aortic Valve: The aortic valve is calcified. Aortic valve regurgitation is trivial. Mild to moderate aortic valve sclerosis/calcification is present, without any evidence of aortic stenosis. Aortic valve mean gradient measures 3.0 mmHg.  Aortic valve peak  gradient measures 5.9 mmHg. Aortic valve area, by VTI measures 1.71 cm. Pulmonic Valve: The pulmonic valve was not well visualized. Pulmonic valve regurgitation is trivial. Aorta: The aortic root is normal in size and structure. IAS/Shunts: No atrial level shunt detected by color flow Doppler. Additional Comments: A pacer wire is visualized.  LEFT VENTRICLE PLAX 2D LVIDd:         3.50 cm LVIDs:         2.10 cm LV PW:         1.10 cm LV IVS:        0.80 cm LVOT diam:     2.20 cm LV SV:         38 LV SV Index:   26 LVOT Area:     3.80 cm  RIGHT VENTRICLE RV Basal diam:  4.80 cm LEFT ATRIUM             Index       RIGHT ATRIUM           Index LA diam:        4.10 cm 2.87 cm/m  RA Area:     30.00 cm LA Vol (A2C):   40.4 ml 28.28 ml/m RA Volume:   112.00 ml 78.39 ml/m LA Vol (A4C):   92.3 ml 64.61 ml/m LA Biplane Vol: 65.3 ml 45.71 ml/m  AORTIC VALVE                   PULMONIC VALVE AV Area (Vmax):    1.97 cm    PV Vmax:       0.68 m/s AV Area (Vmean):   1.92 cm    PV Vmean:      42.900 cm/s AV Area (VTI):     1.71 cm    PV VTI:        0.128 m AV Vmax:           121.00 cm/s PV Peak grad:  1.9 mmHg AV Vmean:          79.000 cm/s PV Mean grad:  1.0 mmHg AV VTI:            0.220 m  AV Peak Grad:      5.9 mmHg AV Mean Grad:      3.0 mmHg LVOT Vmax:         62.70 cm/s LVOT Vmean:        40.000 cm/s LVOT VTI:          0.099 m LVOT/AV VTI ratio: 0.45  AORTA Ao Root diam: 3.50 cm MITRAL VALVE MV Area (PHT): 6.96 cm    SHUNTS MV Area VTI:   2.36 cm    Systemic VTI:  0.10 m MV Peak grad:  3.1 mmHg    Systemic Diam: 2.20 cm MV Mean grad:  1.0 mmHg MV Vmax:       0.88 m/s MV Vmean:      47.8 cm/s MV Decel Time: 109 msec MV E velocity: 75.80 cm/s MV A velocity: 36.80 cm/s MV E/A ratio:  2.06 Harold Hedge MD Electronically signed by Harold Hedge MD Signature Date/Time: 09/30/2020/11:36:34 AM    Final    US LIVER DOPPLER  Result Date: 10/02/2020 CLINICAL DATA:  Hepatic cirrhosis EXAM: DUPLEX ULTRASOUND OF LIVER TECHNIQUE: Color and duplex Doppler ultrasound was performed to evaluate the hepatic in-flow and out-flow vessels. COMPARISON:  09/30/2020 CT with contrast FINDINGS: Liver: Increased hepatic echogenicity. No definite contour abnormality or significant  surface nodularity. No focal lesion, mass or intrahepatic biliary ductal dilatation. Main Portal Vein size: 0.9 cm Portal Vein Velocities Main Prox:  21 cm/sec Main Mid: 40 cm/sec Main Dist:  36 cm/sec Right: 32 cm/sec Left: 24 cm/sec Hepatic Vein Velocities Right:  51 cm/sec Middle:  58 cm/sec Left:  51 cm/sec IVC: Present and patent with normal respiratory phasicity. Hepatic Artery Velocity:  71 cm/sec Splenic Vein Velocity:  14 cm/sec Spleen: 4 cm x 4 cm x 6 cm with a total volume of 62 cm^3 (411 cm^3 is upper limit normal) Portal Vein Occlusion/Thrombus: No Splenic Vein Occlusion/Thrombus: No Ascites: None Varices: None IMPRESSION: Hepatic veins and IVC are dilated suggesting component of right heart failure. Portal, hepatic and splenic veins are all patent with normal directional flow. No veno-occlusive process. Electronically Signed   By: Judie Petit.  Shick M.D.   On: 10/02/2020 11:19   US THORACENTESIS ASP PLEURAL SPACE W/IMG GUIDE  Result  Date: 10/04/2020 INDICATION: Patient with history of pulmonary hypertension and persistent AFib. Found to have a recurrent right-sided pleural effusion. Team is requesting therapeutic and diagnostic thoracentesis EXAM: ULTRASOUND GUIDED THERAPEUTIC AND DIAGNOSTIC THORACENTESIS MEDICATIONS: Lidocaine 1% 10 mL COMPLICATIONS: None immediate. PROCEDURE: An ultrasound guided thoracentesis was thoroughly discussed with the patient and questions answered. The benefits, risks, alternatives and complications were also discussed. The patient understands and wishes to proceed with the procedure. Written consent was obtained. Ultrasound was performed to localize and mark an adequate pocket of fluid in the right chest. The area was then prepped and draped in the normal sterile fashion. 1% Lidocaine was used for local anesthesia. Under ultrasound guidance a 6 Fr Safe-T-Centesis catheter was introduced. Thoracentesis was performed. The catheter was removed and a dressing applied. FINDINGS: A total of approximately 350 mL of amber colored fluid was removed. Samples were sent to the laboratory as requested by the clinical team. IMPRESSION: Successful ultrasound guided therapeutic and diagnostic right sided thoracentesis yielding 350 mL of pleural fluid. Read by: Anders Grant, NP Electronically Signed   By: Acquanetta Belling M.D.   On: 10/03/2020 16:35   US THORACENTESIS ASP PLEURAL SPACE W/IMG GUIDE  Result Date: 09/30/2020 INDICATION: Patient with history of pulmonary hypertension and persistent AFib. Found to have bilateral pleural effusions after presenting to the emergency department with shortness of breath. Team is requesting therapeutic and diagnostic thoracentesis EXAM: ULTRASOUND GUIDED RIGHT-SIDED THERAPEUTIC AND DIAGNOSTIC THORACENTESIS MEDICATIONS: Lidocaine 1% 10 mL COMPLICATIONS: None immediate. PROCEDURE: An ultrasound guided thoracentesis was thoroughly discussed with the patient and questions answered. The  benefits, risks, alternatives and complications were also discussed. The patient understands and wishes to proceed with the procedure. Written consent was obtained. Ultrasound was performed to localize and mark an adequate pocket of fluid in the right chest. The area was then prepped and draped in the normal sterile fashion. 1% Lidocaine was used for local anesthesia. Under ultrasound guidance a 6 Fr Safe-T-Centesis catheter was introduced. Thoracentesis was performed. The catheter was removed and a dressing applied. FINDINGS: A total of approximately 350 mL of amber colored fluid was removed. Samples were sent to the laboratory as requested by the clinical team. Patient unable to tolerate additional fluid removal and per patient request the procedure was terminated. IMPRESSION: Successful ultrasound guided therapeutic and diagnostic right-sided thoracentesis yielding 350 mL of pleural fluid. Read by: Anders Grant, NP Electronically Signed   By: Simonne Come M.D.   On: 09/30/2020 12:54   Assessment/Plan Patrick Mccormick is a 87 y.o. male with medical  history significant for pulmonary hypertension, hypertension, history of SIADH, unintended weight loss, history of gastrectomy, persistent atrial fibrillation, iron deficiency anemia, presented to the emergency department for chief concerns of shortness of breath.   1.  Pulmonary Embolism: CTA chest reviewed with Dr. Gilda Crease. In respect to the patient's pulmonary compromise - massive bilateral pleural effusions are much greater issue.  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  Also it appears to be a weblike lesion which is nonocclusive and does not appear to be flow-limiting. Given the chronic nature as well as the nonocclusive aspect I do not believe that pulmonary thrombectomy would be of great benefit.  2.  Atrial fibrillation: On Eliquis Followed regularly by the  patient's cardiologist  3.  Malnutrition: On feeding supplements and vitamins  Discussed with Dr. Romie Jumper, PA-C  10/04/2020 2:40 PM  This note was created with Dragon medical transcription system.  Any error is purely unintentional

## 2020-10-04 NOTE — Progress Notes (Signed)
PROGRESS NOTE    Patrick Mccormick  NTZ:[id number] DOB: 16-Apr-1932 DOA: 09/29/2020 PCP: Conan Bowens., MD   Brief Narrative: Taken from H&P. Patrick Mccormick is a 87 y.o. male with medical history significant for pulmonary hypertension, hypertension, history of SIADH, unintended weight loss, history of gastrectomy, persistent atrial fibrillation, iron deficiency anemia, presented to the emergency department for chief concerns of shortness of breath.   At baseline, patient is not on home oxygen.  He endorses shortness of breath x one week. He has unintentionally lost about 40 lbs in 1.5 years (150ish to 110) and extreme exhaustion and does not get hungry. He reports poor appetite.   Found to have bilateral pleural effusions, history of thoracentesis done twice. And hypoxic requiring supplemental oxygen.  Labs pertinent for hyponatremia with sodium of 122.  CTA with chronic PEs.  Echocardiogram without any significant abnormality.  Most of his care was done at [facility], recently moved to [place].  Pretty complicated patient and multiple specialities involved.  Received thoracentesis twice, borderline exudative fluid.  Worsening renal function, some concern of hepatic congestion but no obvious liver failure.  Imaging was negative for cirrhosis but did show hepatic vascular congestion most likely secondary to right heart failure. Patient has cor pulmonale with right heart failure, moderate pulmonary hypertension. Patient continued to deteriorate, with volume overload and anasarca, unable to diurese initially due to softer blood pressures.  Sodium not responding to usual treatment.  Palliative care was also consulted.  Family wants to try little more before proceeding for comfort care.  Initially thought to give him a trial of milrinone with Lasix infusion, later decided to only proceed with Lasix infusion as milrinone might get more harm than benefit in his case.  Subjective: Patient was alert  but quite disoriented when seen today.  He was keep talking about some computer wiring.  He was able to participate in communication upheld yesterday.  Respiratory status seems more comfortable after second thoracentesis which was done yesterday afternoon.  Assessment & Plan:   Principal Problem:   Respiratory distress Active Problems:   Pleural effusion due to congestive heart failure (HCC)   Pulmonary hypertension (HCC)   Atrial fibrillation, chronic (HCC)   History of partial gastrectomy   Protein-calorie malnutrition, severe  Acute hypoxic respiratory failure/bilateral pleural effusion/PE/cor pulmonale.  CTA positive for PE but this seems chronic.  Also had bilateral pleural effusions s/p right-sided thoracentesis twice with removal of 350 cc each time.   History of recurrent pleural effusions and had thoracentesis twice in the past 2 years.  Questionable pleural calcification which will need further investigation. Currently saturating on room air. Echocardiogram with normal EF and pulmonary hypertension.  Elevated BNP. CT abdomen is concerning for anasarca and hepatic congestion more consistent with right-sided heart failure. Thoracentesis labs so far borderline exudative with lymphocytic predominant,  Cultures negative, cytology pending. -Increase the dose of Eliquis to full for treatment. -Pulmonary was consulted-appreciate their recommendations. -Patient was started on midodrine with Lasix by pulmonary, resulted in an increase in creatinine, Lasix was held and he was given some gentle IV fluid but renal function continued to deteriorate. -Liver ultrasound Doppler-with increased liver echogenicity, no surface changes, dilated hepatic vein and IVC which were more consistent with right heart failure.  Most likely have cor pulmonale. -Palliative care consult.  Decided to give him a trial of low-dose Lasix infusion and if no response then proceed with comfort care.  Anasarca.  CT  abdomen and pelvis with concern of anasarca and  concern of right-sided heart failure.  Patient uses Lasix 20 mg twice daily. Repeat echocardiogram with normal EF, no wall motion abnormalities, normal biventricular systolic and diastolic function, no comment on pulmonary pressure.  Lasix with resulted increase in creatinine.  Function continued to get worse despite giving some IV fluid. -Encourage p.o. hydration as we would like to avoid volume overload. -Closely monitor volume status. -Daily BMP and weight -Strict intake and output  Unintentional weight loss.  By looking at her providers notes in care everywhere it looks like weight loss started after getting partial gastrectomy in May 2020 secondary to [facility] ulcer.  CT chest with some concern of calcifications at left lung base.  CT abdomen and pelvis was done today which was negative for any concerning mass but did show extensive atherosclerosis. TSH borderline high with normal free T4 and low T3.  Vitamin B12 elevated, copper levels pending which were checked as he has partial gastrectomy. -Started him on Synthroid-will need a repeat check in 4 weeks and a close follow-up with PCP for optimization of dose. -Dietitian consult to improve nutrition. -We will need further work-up as an outpatient  AKI with CKD stage IIIb.  Worsening renal function not responding to IV fluid or diuresis.  Some evidence of anasarca on imaging but clinically does not appear that volume up.  Dilated hepatic vein and IVC on liver Dopplers. -Nephrology was consulted-they are concerned about multiorgan failure and he will not be a good candidate for dialysis. -Trying Lasix infusion to see if that helps. -Continue to monitor  Hyperkalemia.  Most likely secondary to renal failure. Some improvement with Lokelma. -Continue Lokelma with twice daily dose -Monitor potassium  Hyponatremia.  Sodium remains at 120. Patient has an history of SIADH per chart review.  Sodium  was within normal limit when last time checked in November 2021. Hyponatremia labs with low serum osmolality low serum osmolality and urinary sodium.  Some concern of secondary to liver disease as patient drinks alcohol regularly. -Nephrology was consulted-appreciate their help. -Monitor sodium   Severe protein caloric malnutrition.  Most likely secondary to partial gastrectomy. -Dietitian consult.  Pulmonary hypertension.  Patient followed up with pulmonary at [facility] and wants to establish locally. -Pulmonary consult -Pulmonary hold sildenafil.  Persistent atrial fibrillation/sick sinus s/p permanent pacemaker. Continue home dose of atenolol Increase the dose of Eliquis from 2.5 to full dose for treatment of PE  Diarrhea.  Resolved.  Generalized weakness. PT is recommending SNF. -TOC for placement  Palliative care consult.  Patient with multiorgan failure, concern of hepatorenal and cardiorenal syndrome.  Worsening cor pulmonale with right heart failure although echo did not show any right ventricular dysfunction. Palliative care was consulted. Patient is very high risk for deterioration and death.  Objective: Vitals:   10/15/2020 0358 Oct 15, 2020 0840 2020/10/15 1300 10-15-20 1526  BP: (!) 99/53 104/64 103/64 112/72  Pulse: 62 60 (!) 59 60  Resp: 15 16 18 15   Temp: 98.6 F (37 C) 99.4 F (37.4 C) (!) 97.4 F (36.3 C) 97.7 F (36.5 C)  TempSrc:  Oral Oral Oral  SpO2: 100% 100% 100% 100%  Weight: 39.4 kg     Height:        Intake/Output Summary (Last 24 hours) at October 15, 2020 1730 Last data filed at October 15, 2020 1142 Gross per 24 hour  Intake 160 ml  Output 0 ml  Net 160 ml   Filed Weights   10/02/20 0344 10/03/20 0501 10/15/20 0358  Weight: 38.7 kg 39.1 kg 39.4  kg    Examination:  General.  Frail, cachectic elderly man, in no acute distress. Pulmonary.  Decreased breath sound at bases, normal respiratory effort. CV.  Regular rate and rhythm, no JVD, rub or  murmur. Abdomen.  Soft, nontender, nondistended, BS positive. CNS.  Alert and oriented x3.  No focal neurologic deficit. Extremities.  No edema, no cyanosis, pulses intact and symmetrical. Psychiatry.  Judgment and insight appears impaired.  DVT prophylaxis: Eliquis Code Status: Partial Family Communication: Wife was updated at bedside, talked with 1 son and daughter on phone. Disposition Plan:  Status is: Inpatient  Remains inpatient appropriate because:Inpatient level of care appropriate due to severity of illness   Dispo: The patient is from: Home              Anticipated d/c is to: SNF              Anticipated d/c date is: 2 days              Patient currently is not medically stable to d/c.   Difficult to place patient No              Level of care: Stepdown  Consultants:   Vascular surgery  Pulmonology  Nephrology  Procedures:  Antimicrobials:   Data Reviewed: I have personally reviewed following labs and imaging studies  CBC: Recent Labs  Lab 09/29/20 1102 09/30/20 0432 10/01/20 0432 10/02/20 0444 10/03/20 0409  WBC 4.0 3.8* 4.7 5.2 7.7  NEUTROABS 2.8  --   --   --   --   HGB 11.4* 10.2* 9.8* 9.0* 9.6*  HCT 33.1* 28.4* 28.8* 25.9* 27.4*  MCV 93.8 91.0 94.4 94.9 94.5  PLT 157 128* 130* 134* 148*   Basic Metabolic Panel: Recent Labs  Lab 10/01/20 0432 10/01/20 1645 10/02/20 0444 10/03/20 0409 10/04/20 0502 10/04/20 1353  NA 122* 121* 120* 120* 123*  --   K 4.3 4.5 4.7 5.2* 5.6* 5.3*  CL 89* 90* 89* 89* 88*  --   CO2 --   GLUCOSE 89 94 148* 133* 122*  --   BUN 40* 46* 51* 70* 87*  --   CREATININE 1.71* 1.81* 2.28* 3.02* 3.77*  --   CALCIUM 9.0 9.0 9.1 9.5 10.2  --    GFR: Estimated Creatinine Clearance: 7.8 mL/min (A) (by C-G formula based on SCr of 3.77 mg/dL (H)). Liver Function Tests: Recent Labs  Lab 09/29/20 1102 10/02/20 0444 10/03/20 0409 10/04/20 0502  AST 33 34 25 28  ALT ALKPHOS 137* 105 109 100   BILITOT 1.5* 1.0 0.6 0.6  PROT 7.0 6.0* 6.2* 6.3*  ALBUMIN 3.5 3.2* 3.3* 3.3*   No results for input(s): LIPASE, AMYLASE in the last 168 hours. No results for input(s): AMMONIA in the last 168 hours. Coagulation Profile: Recent Labs  Lab 09/29/20 1102  INR 1.6*   Cardiac Enzymes: No results for input(s): CKTOTAL, CKMB, CKMBINDEX, TROPONINI in the last 168 hours. BNP (last 3 results) No results for input(s): PROBNP in the last 8760 hours. HbA1C: No results for input(s): HGBA1C in the last 72 hours. CBG: Recent Labs  Lab 09/30/20 0909 09/30/20 0957 09/30/20 1126 10/01/20 2123  GLUCAP 37* 119* 93 217*   Lipid Profile: No results for input(s): CHOL, HDL, LDLCALC, TRIG, CHOLHDL, LDLDIRECT in the last 72 hours. Thyroid Function Tests: No results for input(s): TSH, T4TOTAL, FREET4, T3FREE, THYROIDAB in the last 72 hours. Anemia  Panel: No results for input(s): VITAMINB12, FOLATE, FERRITIN, TIBC, IRON, RETICCTPCT in the last 72 hours. Sepsis Labs: Recent Labs  Lab 09/29/20 1310 10/02/20 1816 10/02/20 2125  PROCALCITON 0.16  --   --   LATICACIDVEN  --  1.5 1.7    Recent Results (from the past 240 hour(s))  Resp Panel by RT-PCR (Flu A&B, Covid) Nasopharyngeal Swab     Status: None   Collection Time: 09/29/20 11:02 AM   Specimen: Nasopharyngeal Swab; Nasopharyngeal(NP) swabs in vial transport medium  Result Value Ref Range Status   SARS Coronavirus 2 by RT PCR NEGATIVE NEGATIVE Final    Comment: (NOTE) SARS-CoV-2 target nucleic acids are NOT DETECTED.  The SARS-CoV-2 RNA is generally detectable in upper respiratory specimens during the acute phase of infection. The lowest concentration of SARS-CoV-2 viral copies this assay can detect is 138 copies/mL. A negative result does not preclude SARS-Cov-2 infection and should not be used as the sole basis for treatment or other patient management decisions. A negative result may occur with  improper specimen  collection/handling, submission of specimen other than nasopharyngeal swab, presence of viral mutation(s) within the areas targeted by this assay, and inadequate number of viral copies(<138 copies/mL). A negative result must be combined with clinical observations, patient history, and epidemiological information. The expected result is Negative.  Fact Sheet for Patients:  BloggerCourse.com  Fact Sheet for Healthcare Providers:  SeriousBroker.it  This test is no t yet approved or cleared by the [place] FDA and  has been authorized for detection and/or diagnosis of SARS-CoV-2 by FDA under an Emergency Use Authorization (EUA). This EUA will remain  in effect (meaning this test can be used) for the duration of the COVID-19 declaration under Section 564(b)(1) of the Act, 21 U.S.C.section 360bbb-3(b)(1), unless the authorization is terminated  or revoked sooner.       Influenza A by PCR NEGATIVE NEGATIVE Final   Influenza B by PCR NEGATIVE NEGATIVE Final    Comment: (NOTE) The Xpert Xpress SARS-CoV-2/FLU/RSV plus assay is intended as an aid in the diagnosis of influenza from Nasopharyngeal swab specimens and should not be used as a sole basis for treatment. Nasal washings and aspirates are unacceptable for Xpert Xpress SARS-CoV-2/FLU/RSV testing.  Fact Sheet for Patients: BloggerCourse.com  Fact Sheet for Healthcare Providers: SeriousBroker.it  This test is not yet approved or cleared by the [place] FDA and has been authorized for detection and/or diagnosis of SARS-CoV-2 by FDA under an Emergency Use Authorization (EUA). This EUA will remain in effect (meaning this test can be used) for the duration of the COVID-19 declaration under Section 564(b)(1) of the Act, 21 U.S.C. section 360bbb-3(b)(1), unless the authorization is terminated or revoked.  Performed at [facility], [address] [place], [place] [address]   MRSA PCR Screening     Status: None   Collection Time: 09/29/20  6:41 PM   Specimen: Nasopharyngeal  Result Value Ref Range Status   MRSA by PCR NEGATIVE NEGATIVE Final    Comment:        The GeneXpert MRSA Assay (FDA approved for NASAL specimens only), is one component of a comprehensive MRSA colonization surveillance program. It is not intended to diagnose MRSA infection nor to guide or monitor treatment for MRSA infections. Performed at [facility], [address], [place], [place] [address]   Body fluid culture     Status: None   Collection Time: 09/30/20 12:00 PM   Specimen: PATH Cytology Pleural fluid  Result Value Ref Range Status   Specimen Description   Final    PLEURAL Performed at [facility], [address], [place], [place] [address]    Special Requests   Final    NONE Performed at [facility], [address] [place], [place] [address]    Gram Stain NO WBC SEEN NO ORGANISMS SEEN   Final   Culture   Final    NO GROWTH 3 DAYS Performed at [facility], [address] [place], [place] [address]    Report Status 10/03/2020 FINAL  Final  Fungus Culture With Stain     Status: None (Preliminary result)   Collection Time: 09/30/20 12:00 PM   Specimen: PATH Cytology Pleural fluid  Result Value Ref Range Status   Fungus Stain Final report  Final    Comment: (NOTE) Performed At: [facility] [address] [place], [place] [id number] Jolene Schimke MD DJ:[id number]    Fungus (Mycology) Culture PENDING  Incomplete   Fungal Source PLEURAL  Final    Comment: Performed at [facility], [address] [place], [place] [address]  Acid Fast Smear (AFB)     Status: None   Collection Time: 09/30/20 12:00 PM   Specimen: PATH Cytology Pleural fluid  Result Value Ref Range Status   AFB Specimen Processing Concentration  Final   Acid Fast Smear  Negative  Final    Comment: (NOTE) Performed At: [facility] [address] [place], [place] [id number] Jolene Schimke MD JF:[id number]    Source (AFB) PLEURAL  Final    Comment: Performed at [facility], [address] [place], [place] [address]  Fungus Culture Result     Status: None   Collection Time: 09/30/20 12:00 PM  Result Value Ref Range Status   Result 1 Comment  Final    Comment: (NOTE) KOH/Calcofluor preparation:  no fungus observed. Performed At: [facility] [address] [place], [place] [id number] Jolene Schimke MD WI:[id number]   Body fluid culture w Gram Stain     Status: None (Preliminary result)   Collection Time: 10/03/20  3:40 PM   Specimen: PATH Cytology Pleural fluid  Result Value Ref Range Status   Specimen Description   Final    PLEURAL Performed at [facility], [address], [place], [place] [address]    Special Requests   Final    PLEURAL Performed at [facility], [address] [place], [place] [address]    Gram Stain   Final    NO WBC SEEN NO ORGANISMS SEEN Performed at [facility], [address], [place], [place] [address]    Culture PENDING  Incomplete   Report Status PENDING  Incomplete     Radiology Studies: DG Chest 2 View  Result Date: 10/03/2020 CLINICAL DATA:  Acute renal injury EXAM: CHEST - 2 VIEW COMPARISON:  10/02/2020 FINDINGS: LEFT-sided pacemaker overlies stable cardiac silhouette. Large bilateral loculated pleural effusions. The LEFT effusion appears slightly decreased. No pneumothorax. Calcified pleural plaque over the LEFT hemidiaphragm. IMPRESSION: 1. Bilateral large loculated pleural effusions. 2. LEFT effusion may be slightly reduced. 3. No pulmonary edema or pneumothorax. Electronically Signed   By: Genevive Bi M.D.   On: 10/03/2020 10:23   US RENAL  Result Date: 10/03/2020 CLINICAL DATA:  Acute kidney injury in a 87 year old male EXAM: RENAL / URINARY  TRACT ULTRASOUND COMPLETE COMPARISON:  Abdomen and pelvis CT from 2022, February 2022 FINDINGS: Right Kidney: Renal measurements:  9.0 x 4.1 x 5.7 cm = volume: 110 mL. Slight increased echogenicity of renal cortex. Cyst in the interpolar aspect of the RIGHT kidney 2.5 x 2.4 x 1.4 cm. No hydronephrosis. Left Kidney: Renal measurements: 9.6 x 4.8 x 4.8 cm = volume: 114 mL. Mild increased echogenicity the without hydronephrosis or focal lesion aside from a small cyst in the lower pole. Bladder: Under distended urinary bladder with small volume fluid in the pelvis, small volume fluid in the pelvis seen on prior imaging Other: Small volume ascites in the pelvis as discussed. IMPRESSION: Mild increased cortical echogenicity as can be seen in the setting of medical renal disease. No hydronephrosis. Small volume pelvic ascites. Electronically Signed   By: Donzetta Kohut M.D.   On: 10/03/2020 10:52   DG Chest Port 1 View  Result Date: 10/04/2020 CLINICAL DATA:  Prior right thoracentesis. EXAM: PORTABLE CHEST 1 VIEW COMPARISON:  10/03/2020.  10/02/2020. FINDINGS: AICD noted stable position. Stable cardiomegaly. Bilateral interstitial infiltrates/edema cannot be excluded. Right pleural effusion is stable. Stable loculated left pleural effusion. Left base calcified pleural plaques again noted. No pneumothorax noted on today's exam. IMPRESSION: 1. Stable right pleural effusion. No pneumothorax noted on today's exam. Stable loculated left pleural effusion. 2. AICD noted stable position. Stable cardiomegaly. Bilateral interstitial edema/infiltrates cannot be excluded. Electronically Signed   By: Maisie Fus  Register   On: 10/04/2020 08:24   DG Chest Port 1 View  Result Date: 10/03/2020 CLINICAL DATA:  Status post thoracentesis. EXAM: PORTABLE CHEST 1 VIEW COMPARISON:  Same day. FINDINGS: Stable cardiomediastinal silhouette. Right pleural effusion is significantly smaller status post thoracentesis. Possible small right apical  pneumothorax is noted which most likely represents ex vacuo pneumothorax. Stable right lung opacity is noted. IMPRESSION: Right pleural effusion is significantly smaller status post thoracentesis. Possible small right apical pneumothorax is noted which most likely represents ex vacuo pneumothorax. Stable right lung opacity. Electronically Signed   By: Lupita Raider M.D.   On: 10/03/2020 16:10   DG Chest Port 1 View  Result Date: 10/02/2020 CLINICAL DATA:  Fatigue EXAM: PORTABLE CHEST 1 VIEW COMPARISON:  09/30/2020 FINDINGS: There are persistent bilateral loculated pleural effusions which appear to have increased on the right but are stable on the left. Pleural base calcifications are again noted. There is no pneumothorax. The heart size is stable. Aortic calcifications are noted. There is a biventricular pacemaker in place. There is no acute osseous abnormality. IMPRESSION: Persistent bilateral loculated pleural effusions, increased on the right but stable on the left. Electronically Signed   By: Katherine Mantle M.D.   On: 10/02/2020 18:43   US THORACENTESIS ASP PLEURAL SPACE W/IMG GUIDE  Result Date: 10/04/2020 INDICATION: Patient with history of pulmonary hypertension and persistent AFib. Found to have a recurrent right-sided pleural effusion. Team is requesting therapeutic and diagnostic thoracentesis EXAM: ULTRASOUND GUIDED THERAPEUTIC AND DIAGNOSTIC THORACENTESIS MEDICATIONS: Lidocaine 1% 10 mL COMPLICATIONS: None immediate. PROCEDURE: An ultrasound guided thoracentesis was thoroughly discussed with the patient and questions answered. The benefits, risks, alternatives and complications were also discussed. The patient understands and wishes to proceed with the procedure. Written consent was obtained. Ultrasound was performed to localize and mark an adequate pocket of fluid in the right chest. The area was then prepped and draped in the normal sterile fashion. 1% Lidocaine was used for local  anesthesia. Under ultrasound guidance a 6 Fr Safe-T-Centesis catheter was introduced. Thoracentesis was performed. The catheter was removed and a dressing applied. FINDINGS: A total of approximately 350  mL of amber colored fluid was removed. Samples were sent to the laboratory as requested by the clinical team. IMPRESSION: Successful ultrasound guided therapeutic and diagnostic right sided thoracentesis yielding 350 mL of pleural fluid. Read by: Anders GrantJennifer Omohundro, NP Electronically Signed   By: Acquanetta BellingFarhaan  Mir M.D.   On: 10/03/2020 16:35    Scheduled Meds: . allopurinol  100 mg Oral Daily  . apixaban  10 mg Oral BID   Followed by  . [START ON 10/07/2020] apixaban  5 mg Oral BID  . vitamin C  250 mg Oral BID  . atorvastatin  20 mg Oral Daily  . feeding supplement  237 mL Oral TID BM  . hydrocortisone sod succinate (SOLU-CORTEF) inj  50 mg Intravenous Q6H  . levothyroxine  25 mcg Oral Q0600  . midodrine  10 mg Oral QID  . mirtazapine  7.5 mg Oral QHS  . multivitamin with minerals  1 tablet Oral Daily  . sodium zirconium cyclosilicate  10 g Oral BID   Continuous Infusions: . furosemide (LASIX) 200 mg in dextrose 5% 100 mL (2mg /mL) infusion       LOS: 4 days   Time spent: 35 minutes.  Arnetha CourserSumayya Vastie Douty, MD [facility]  If 7PM-7AM, please contact night-coverage [email or web address]  10/04/2020, 5:30 PM   This record has been created using Conservation officer, historic buildingsDragon voice recognition software. Errors have been sought and corrected,but may not always be located. Such creation errors do not reflect on the standard of care.

## 2020-10-04 NOTE — Progress Notes (Signed)
PT Cancellation Note  Patient Details Name: Patrick Mccormick MRN: [id number] DOB: 1932/06/24   Cancelled Treatment:    Reason Eval/Treat Not Completed: Medical issues which prohibited therapy Chart reviewed - pt noted to have K+ critically high at5.6; contraindicated for exertional activity at this time. Palliative consult pending.  PT will hold on exertional activity today and follow up as appropriate.   Nolon Bussing, PT, DPT 10/04/20, 9:45 AM

## 2020-10-04 NOTE — Consult Note (Signed)
Consultation Note Date: 10/04/2020   Patient Name: Patrick Mccormick  DOB: 02-May-1932  MRN: [id number]  Age / Sex: 87 y.o., male  PCP: Conan Bowens., MD Referring Physician: [name], MD  Reason for Consultation: Establishing goals of care and Psychosocial/spiritual support  HPI/Patient Profile: 87 y.o. male  with past medical history of pulmonary hypertension, HTN, history of SIADH, partial gastrectomy, persistent A. fib, iron deficiency anemia, unintended weight loss, admitted on 09/29/2020 with acute hypoxic respiratory failure/bilateral pleural effusion/cor pulmonale.   Clinical Assessment and Goals of Care: I have reviewed medical records including EPIC notes, labs and imaging, received report from attending, examined the patient.  Patrick Mccormick is lying quietly in bed.  He will briefly open his eyes, but not make or keep eye contact.  He seems to be hard of hearing.  He answers my questions, mostly appropriately, when I speak loudly.  He is able to tell me his name, and that we are in the hospital.  He does frequently speak about needing a computer and a meter.  I am not sure that he can make his basic needs known.  There is no family at bedside at this time.  Call to wife for 50 years, Patrick Mccormick to discuss diagnosis prognosis, GOC, EOL wishes, disposition and options..  No answer, unable to leave voicemail.  No other contacts/numbers listed.  From chart review, he is married and lives with his spouse of 50 years. 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 He has never been a tobacco user. He drinks about two glasses of wine per night.  Patrick Mccormick tells me that he has 2 children, son Patrick Mccormick, and daughter Patrick Mccormick.  He was born in [place].  Conference with attending, bedside nursing staff, transition of care  team related to patient condition, needs.  HCPOA   NEXT OF KIN -wife of 50 years, Patrick Mccormick.  Quantae tells me he also has a son, Patrick Mccormick, and a daughter Patrick Mccormick.     SUMMARY OF RECOMMENDATIONS   At this point continue to treat the treatable but no CPR or defibrillation Agreeable to BiPAP/mechanical ventilation if needed Continue goals of care discussions with family.   Code Status/Advance Care Planning:  Limited code -agreeable to BiPAP/mechanical ventilation only  Symptom Management:   Per hospitalist, pulmonary.  No additional needs at this time.  Palliative Prophylaxis:   Frequent Pain Assessment, Oral Care and Turn Reposition  Additional Recommendations (Limitations, Scope, Preferences):  Continue to treat the treatable, no CPR.  Agreeable to BiPAP/intubation  Psycho-social/Spiritual:   Desire for further Chaplaincy support:no  Additional Recommendations: Caregiving  Support/Resources and Education on Hospice  Prognosis:   Unable to determine, based on outcomes.  6 months or less would not be surprising based on frailty, weight loss, chronic illness burden.  Discharge Planning: To be determined, based on outcomes.  Anticipate need for short-term rehab      Primary Diagnoses: Present on Admission: .  Pulmonary hypertension (HCC) . Atrial fibrillation, chronic (HCC) . Respiratory distress   I have reviewed the medical record, interviewed the patient and family, and examined the patient. The following aspects are pertinent.  History reviewed. No pertinent past medical history. Social History   Socioeconomic History  . Marital status: Married    Spouse name: Not on file  . Number of children: Not on file  . Years of education: Not on file  . Highest education level: Not on file  Occupational History  . Not on file  Tobacco Use  . Smoking status: Never Smoker  . Smokeless tobacco: Never Used  Substance and Sexual Activity  . Alcohol use: Not on file  .  Drug use: Not on file  . Sexual activity: Not on file  Other Topics Concern  . Not on file  Social History Narrative  . Not on file   Social Determinants of Health   Financial Resource Strain: Not on file  Food Insecurity: Not on file  Transportation Needs: Not on file  Physical Activity: Not on file  Stress: Not on file  Social Connections: Not on file   History reviewed. No pertinent family history. Scheduled Meds: . allopurinol  100 mg Oral Daily  . apixaban  10 mg Oral BID   Followed by  . [START ON 10/07/2020] apixaban  5 mg Oral BID  . vitamin C  250 mg Oral BID  . atorvastatin  20 mg Oral Daily  . feeding supplement  237 mL Oral TID BM  . hydrocortisone sod succinate (SOLU-CORTEF) inj  50 mg Intravenous Q6H  . levothyroxine  25 mcg Oral Q0600  . midodrine  10 mg Oral QID  . mirtazapine  7.5 mg Oral QHS  . multivitamin with minerals  1 tablet Oral Daily  . sodium zirconium cyclosilicate  10 g Oral BID   Continuous Infusions: PRN Meds:.metoprolol tartrate, ondansetron **OR** ondansetron (ZOFRAN) IV, sodium chloride Medications Prior to Admission:  Prior to Admission medications   Medication Sig Start Date End Date Taking? Authorizing Provider  allopurinol (ZYLOPRIM) 100 MG tablet Take 100 mg by mouth daily.   Yes [provider]  atenolol (TENORMIN) 50 MG tablet Take 50 mg by mouth daily.   Yes [provider]  atorvastatin (LIPITOR) 20 MG tablet Take 20 mg by mouth daily.   Yes [provider]  ELIQUIS 2.5 MG TABS tablet Take 2.5 mg by mouth daily. 07/17/20  Yes [provider]  furosemide (LASIX) 20 MG tablet Take 20 mg by mouth 2 (two) times daily. 09/10/20  Yes [provider]  mirtazapine (REMERON) 7.5 MG tablet Take 7.5 mg by mouth at bedtime. 09/21/20  Yes [provider]  sildenafil (REVATIO) 20 MG tablet Take 20 mg by mouth 3 (three) times daily. 09/06/20  Yes [provider]   No Known  Allergies Review of Systems  Unable to perform ROS: Mental status change    Physical Exam Vitals and nursing note reviewed.  Constitutional:      General: He is not in acute distress.    Appearance: He is ill-appearing.  HENT:     Head: Atraumatic.     Comments: Some temporal wasting    Mouth/Throat:     Mouth: Mucous membranes are moist.  Cardiovascular:     Rate and Rhythm: Normal rate.  Pulmonary:     Effort: Pulmonary effort is normal. No respiratory distress.  Abdominal:     General: Abdomen is flat.  Palpations: Abdomen is soft.  Musculoskeletal:        General: No swelling.  Skin:    General: Skin is warm and dry.     Findings: Bruising present.  Neurological:     Comments: Oriented to person and place, periods of confusion  Psychiatric:     Comments: Calm and cooperative     Vital Signs: BP 104/64 (BP Location: Left Arm)   Pulse 60   Temp 99.4 F (37.4 C) (Oral)   Resp 16   Ht 5\' 7"  (1.702 m)   Wt 39.4 kg   SpO2 100%   BMI 13.59 kg/m  Pain Scale: 0-10   Pain Score: 0-No pain   SpO2: SpO2: 100 % O2 Device:SpO2: 100 % O2 Flow Rate: .O2 Flow Rate (L/min): 1 L/min  IO: Intake/output summary:   Intake/Output Summary (Last 24 hours) at 10/04/2020 1242 Last data filed at 10/04/2020 1142 Gross per 24 hour  Intake 160 ml  Output 0 ml  Net 160 ml    LBM: Last BM Date: 10/01/20 Baseline Weight: Weight: 47.3 kg Most recent weight: Weight: 39.4 kg     Palliative Assessment/Data:   Flowsheet Rows   Flowsheet Row Most Recent Value  Intake Tab   Referral Department Hospitalist  Unit at Time of Referral Cardiac/Telemetry Unit  Palliative Care Primary Diagnosis Pulmonary  Date Notified 10/03/20  Palliative Care Type New Palliative care  Reason for referral Clarify Goals of Care  Date of Admission 09/29/20  Date first seen by Palliative Care 10/04/20  # of days Palliative referral response time 1 Day(s)  # of days IP prior to Palliative referral  4  Clinical Assessment   Palliative Performance Scale Score 30%  Pain Max last 24 hours Not able to report  Pain Min Last 24 hours Not able to report  Dyspnea Max Last 24 Hours Not able to report  Dyspnea Min Last 24 hours Not able to report  Psychosocial & Spiritual Assessment   Palliative Care Outcomes       Time In: 1010 Time Out: 1050 Time Total: 40 minutes  Greater than 50%  of this time was spent counseling and coordinating care related to the above assessment and plan.  Signed by: 10/06/20, NP   Please contact Palliative Medicine Team phone at [phone] for questions and concerns.  For individual provider: See [phone]

## 2020-10-04 NOTE — Consult Note (Signed)
[facility]  CARDIOLOGY CONSULT NOTE  Patient ID: Patrick Mccormick MRN: [id number] DOB/AGE: Jan 05, 1932 87 y.o.  Admit date: 09/29/2020 Referring Physician Patrick Mccormick [facility] Patrick Mccormick Reason for Consultation right-sided heart failure  HPI: 87 year old gentleman referred for evaluation of right-sided heart failure and cor pulmonale.  Patient has a history of pulmonary [facility] on sild[facility]afil.  Also has chronic atrial fibrillation, on Eliquis for stroke prevention and atenolol for rate control.  He is status post pacemaker for sick sinus syndrome 07/2018.  Recently, patient has had failure to thrive, with weight loss and cachexia felt to be due to recent gastrectomy.  The patient is admitted with shortness of breath, cocci, complicated by kidney injury.  On admission BUN and creatinine were 36 and 1.21.  Repeat BUN and creatinine today were 87 and 3.77.  The patient has chronic and persistent bilateral pleural effusions status post prior thoracentesis.  CTA of the chest revealed weblike filling defects in the distal left main pulmonary artery may reflect subacute to chronic pulmonary embolism.  ECG reveals ventricular pacing.  2D echocardiogram 09/30/2020 revealed normal left ventricular function, with LVEF 65 to 70%.  Review of systems complete and found to be negative unless listed above     History reviewed. No pertinent past medical history.  History reviewed. No pertinent surgical history.  Medications Prior to Admission  Medication Sig Dispense Refill Last Dose  . allopurinol (ZYLOPRIM) 100 MG tablet Take 100 mg by mouth daily.     Marland Kitchen. atenolol (TENORMIN) 50 MG tablet Take 50 mg by mouth daily.   09/27/2020 at PM  . atorvastatin (LIPITOR) 20 MG tablet Take 20 mg by mouth daily.   09/27/2020 at PM  . ELIQUIS 2.5 MG TABS tablet Take 2.5 mg by mouth daily.   09/27/2020 at PM  . furosemide (LASIX) 20 MG tablet Take 20 mg by mouth 2 (two) times  daily.   09/27/2020 at PM  . mirtazapine (REMERON) 7.5 MG tablet Take 7.5 mg by mouth at bedtime.   09/27/2020 at PM  . sildenafil (REVATIO) 20 MG tablet Take 20 mg by mouth 3 (three) times daily.   09/27/2020 at PM   Social History   Socioeconomic History  . Marital status: Married    Spouse name: Not on file  . Number of children: Not on file  . Years of education: Not on file  . Highest education level: Not on file  Occupational History  . Not on file  Tobacco Use  . Smoking status: Never Smoker  . Smokeless tobacco: Never Used  Substance and Sexual Activity  . Alcohol use: Not on file  . Drug use: Not on file  . Sexual activity: Not on file  Other Topics Concern  . Not on file  Social History Narrative  . Not on file   Social Determinants of Health   Financial Resource Strain: Not on file  Food Insecurity: Not on file  Transportation Needs: Not on file  Physical Activity: Not on file  Stress: Not on file  Social Connections: Not on file  Intimate Partner Violence: Not on file    History reviewed. No pertinent family history.    Review of systems complete and found to be negative unless listed above      PHYSICAL EXAM  General: Well developed, well nourished, in no acute distress HEENT:  Normocephalic and atramatic Neck:  No JVD.  Lungs: Clear bilaterally to auscultation and percussion. Heart: HRRR . Normal S1  and S2 without gallops or murmurs.  Abdomen: Bowel sounds are positive, abdomen soft and non-tender  Msk:  Back normal, normal gait. Normal strength and tone for age. Extremities: No clubbing, cyanosis or edema.   Neuro: Alert and oriented X 3. Psych:  Good affect, responds appropriately  Labs:   Lab Results  Component Value Date   WBC 7.7 10/03/2020   HGB 9.6 (L) 10/03/2020   HCT 27.4 (L) 10/03/2020   MCV 94.5 10/03/2020   PLT 148 (L) 10/03/2020    Recent Labs  Lab 10/04/20 0502 10/04/20 1353  NA 123*  --   K 5.6* 5.3*  CL 88*  --   CO2 25   --   BUN 87*  --   CREATININE 3.77*  --   CALCIUM 10.2  --   PROT 6.3*  --   BILITOT 0.6  --   ALKPHOS 100  --   ALT 17  --   AST 28  --   GLUCOSE 122*  --    No results found for: CKTOTAL, CKMB, CKMBINDEX, TROPONINI No results found for: CHOL No results found for: HDL No results found for: LDLCALC No results found for: TRIG No results found for: CHOLHDL No results found for: LDLDIRECT    Radiology: DG Chest 2 View  Result Date: 10/03/2020 CLINICAL DATA:  Acute renal injury EXAM: CHEST - 2 VIEW COMPARISON:  10/02/2020 FINDINGS: LEFT-sided pacemaker overlies stable cardiac silhouette. Large bilateral loculated pleural effusions. The LEFT effusion appears slightly decreased. No pneumothorax. Calcified pleural plaque over the LEFT hemidiaphragm. IMPRESSION: 1. Bilateral large loculated pleural effusions. 2. LEFT effusion may be slightly reduced. 3. No pulmonary edema or pneumothorax. Electronically Signed   By: Genevive Bi M.D.   On: 10/03/2020 10:23   CT Angio Chest PE W and/or Wo Contrast  Addendum Date: 09/29/2020   ADDENDUM REPORT: 09/29/2020 15:35 ADDENDUM: These results were called by telephone at the time of interpretation on 09/29/2020 at 3:35 pm to provider Patrick Mccormick , who verbally acknowledged these results. Electronically Signed   By: Kreg Shropshire M.D.   On: 09/29/2020 15:35   Result Date: 09/29/2020 CLINICAL DATA:  Dyspnea EXAM: CT ANGIOGRAPHY CHEST WITH CONTRAST TECHNIQUE: Multidetector CT imaging of the chest was performed using the standard protocol during bolus administration of intravenous contrast. Multiplanar CT image reconstructions and MIPs were obtained to evaluate the vascular anatomy. CONTRAST:  62mL OMNIPAQUE IOHEXOL 350 MG/ML SOLN COMPARISON:  Radiograph 09/29/2020 FINDINGS: Cardiovascular: Satisfactory opacification of pulmonary arteries. Some thin linear web-like bands of hypoattenuation in the distal left main pulmonary artery and lingular branches. Some more  anti dependent hypoattenuation within the segmental pulmonary arteries of the left lower lobe have an appearance favored to be laminar flow and mixing artifact though a more subacute to remote pulmonary embolism is not fully excluded. Central pulmonary arteries are enlarged. There is marked cardiomegaly with four-chamber enlargement. Three-vessel coronary artery atherosclerosis is present. Left chest wall pacer pack is noted with leads at the right atrium, coronary sinus and cardiac apex. Trace pericardial effusion. Suboptimal opacification of the aorta for luminal assessment. Atherosclerotic plaque within the normal caliber aorta. Normal 3 vessel branching of the aortic arch. Reflux of contrast in the hepatic veins and IVC. Mediastinum/Nodes: Diffuse edematous changes throughout the mediastinum. Scattered low-attenuation mediastinal and hilar lymph nodes favored to be reactive/edematous. No concerning axillary adenopathy. Fluid-filled, patulous thoracic esophagus. Trachea is unremarkable. No concerning thyroid abnormality though poorly evaluated with superimposed streak artifact. Lungs/Pleura: Complex, bilateral loculated  pleural effusions which are predominantly low-attenuation. There are regions of dense basilar pleural calcification in both lungs. A more diffuse pleural thickening is noted in the right lung as well. There are areas of adjacent passive atelectasis with additional more focal nodular atelectatic changes in the right lung (6/64). Could reflect some rounded atelectasis though underlying infection or disease is not fully excluded. Additional areas of bandlike reticular opacity with architectural distortion and scarring. Diffuse airways thickening and scattered secretions are noted as well. No pneumothorax. Upper Abdomen: Diffuse edematous changes noted throughout the upper abdomen and mesentery with small volume ascites. Extensive atherosclerotic plaque in the upper abdominal aorta. Calcified  septation in the infrarenal abdominal aorta may reflect sequela of prior dissection or lamellar calcification of the aorta itself, incompletely assessed on this exam. Postsurgical changes from prior gastric surgery. Musculoskeletal: The osseous structures appear diffusely demineralized which may limit detection of small or nondisplaced fractures. No acute osseous abnormality or suspicious osseous lesion. Degenerative changes are present in the imaged spine and shoulders. Diffuse body wall edema. Review of the MIP images confirms the above findings. IMPRESSION: 1. Web-like filling defects in the distal left main pulmonary artery and extending into the lingular branches may reflect subacute to chronic pulmonary embolism with acute pulmonary embolism less favored. More antidependent incomplete opacification of the left lower lobe pulmonary arteries could reflect laminar flow artifact though underlying embolism is not excluded. Evaluation for right heart strain is precluded in the setting of diffuse cardiomegaly and four-chamber cardiac enlargement though there is evidence of elevated right heart pressures and right-sided dysfunction with enlarged pulmonary arteries and refluxing contrast into the hepatic veins and IVC. Consider further characterization with echocardiography as clinically warranted. 2. Complex, bilateral loculated pleural effusions which are predominantly low-attenuation. Associated regions of dense basilar pleural calcification in both lungs. Findings can be on the spectrum of underlying asbestos related pleural disease though the sterility of these collections cannot be fully ascertained on an imaging basis. Extensive areas of passive atelectasis. More focal nodular atelectatic changes in the right lung as well. Could reflect some rounded atelectasis though underlying infection or disease is not fully excluded. Recommend attention on follow-up imaging. 3. Features of anasarca/volume overload with  diffuse edematous changes throughout the mediastinum, mediastinum, upper abdomen, mesentery and body wall with small volume abdominal ascites. 4. Aortic Atherosclerosis (ICD10-I70.0). Currently attempting to contact the ordering provider with a critical value result. Addendum will be submitted upon case discussion. Electronically Signed: By: Kreg Shropshire M.D. On: 09/29/2020 15:28   CT ABDOMEN PELVIS W CONTRAST  Result Date: 09/30/2020 CLINICAL DATA:  Unintentional weight loss EXAM: CT ABDOMEN AND PELVIS WITH CONTRAST TECHNIQUE: Multidetector CT imaging of the abdomen and pelvis was performed using the standard protocol following bolus administration of intravenous contrast. CONTRAST:  60mL OMNIPAQUE IOHEXOL 300 MG/ML  SOLN COMPARISON:  None. FINDINGS: Lower chest: Complex moderate right pleural effusion is again identified with associated pleural thickening. Small focus of extrapleural gas within this collection likely relates to recent thoracentesis. There is associated compressive atelectasis of the right lung base. Partially loculated left pleural effusion largely within the left major fissure appears stable since prior CT examination of 09/29/2020. Dense pleural calcifications at the left lung base likely relate to remote trauma or inflammation. Mild global cardiomegaly with particular enlargement of the a right ventricle and right atrium are again identified. Pacemaker leads are seen within the right heart and left ventricular venous outflow. Extensive calcifications are noted within the coronary arteries. Hepatobiliary: Reflux  of contrast into the hepatic venous system is in keeping with at least some degree of right heart failure. Tiny probable cyst within the inferior right hepatic lobe. Liver otherwise unremarkable. No intra or extrahepatic biliary ductal dilation. Gallbladder unremarkable. Pancreas: Unremarkable Spleen: Unremarkable Adrenals/Urinary Tract: The adrenal glands are unremarkable. The  kidneys are normal in position. There is mild-to-moderate bilateral renal cortical atrophy noted. Bilateral simple cortical cysts are identified. The kidneys are otherwise unremarkable. The bladder is unremarkable. Stomach/Bowel: Surgical changes of a partial gastrectomy are identified. Moderate sigmoid diverticulosis. The stomach, small bowel, and large bowel are otherwise unremarkable. No evidence of obstruction or focal inflammation. Appendix normal. No free intraperitoneal gas. Mild ascites is present. Vascular/Lymphatic: There is extensive aortoiliac atherosclerotic calcification with particularly prominent atherosclerotic calcification at the origin of the a renal and mesenteric arterial vasculature almost certainly resulting in hemodynamically significant stenoses, not well characterized on this examination. Short segment focal dissection within the infrarenal abdominal aorta, likely the result of a remote penetrating atherosclerotic ulcer results in mild ectasia of the infrarenal abdominal aorta without frank aneurysm formation, with a maximal transaxial dimension of 2.6 cm. The common iliac arteries are ectatic bilaterally, left greater than right, without frank aneurysm formation. Extensive atherosclerotic calcification is noted within the lower extremity arterial inflow and visualized outflow. Reproductive: Prostate is unremarkable. Other: There is extensive subcutaneous edema noted throughout the body wall as well as retroperitoneal edema in keeping with changes of anasarca. Musculoskeletal: Degenerative changes are seen within the lumbar spine. No suspicious lytic or blastic bone lesions are seen. Degenerative changes are noted within the hips bilaterally. No acute bone abnormality. IMPRESSION: Complex right hydropneumothorax with small gaseous component likely related to recent thoracentesis. Cardiomegaly and extensive coronary artery calcification. Large mint of the right heart and reflux of contrast  into the hepatic venous system in keeping with at least some degree of right heart failure. Dense calcification at the left lung base likely result of remote trauma or inflammation. Diffuse body wall subcutaneous edema, mild ascites, and retroperitoneal edema most in keeping with moderate anasarca, possibly the result of cardiogenic failure. Peripheral vascular disease with extensive atherosclerotic calcification at the origin of the mesenteric and renal vasculature almost certainly resulting in hemodynamically significant stenosis. Clinical correlation for signs and symptoms of chronic mesenteric ischemia and/or significant renal artery stenosis is recommended. Aortic Atherosclerosis (ICD10-I70.0). Electronically Signed   By: Helyn Numbers MD   On: 09/30/2020 13:09   US RENAL  Result Date: 10/03/2020 CLINICAL DATA:  Acute kidney injury in a 87 year old male EXAM: RENAL / URINARY TRACT ULTRASOUND COMPLETE COMPARISON:  Abdomen and pelvis CT from 2022, February 2022 FINDINGS: Right Kidney: Renal measurements: 9.0 x 4.1 x 5.7 cm = volume: 110 mL. Slight increased echogenicity of renal cortex. Cyst in the interpolar aspect of the RIGHT kidney 2.5 x 2.4 x 1.4 cm. No hydronephrosis. Left Kidney: Renal measurements: 9.6 x 4.8 x 4.8 cm = volume: 114 mL. Mild increased echogenicity the without hydronephrosis or focal lesion aside from a small cyst in the lower pole. Bladder: Under distended urinary bladder with small volume fluid in the pelvis, small volume fluid in the pelvis seen on prior imaging Other: Small volume ascites in the pelvis as discussed. IMPRESSION: Mild increased cortical echogenicity as can be seen in the setting of medical renal disease. No hydronephrosis. Small volume pelvic ascites. Electronically Signed   By: Donzetta Kohut M.D.   On: 10/03/2020 10:52   DG Chest [facility] 1 View  Result  Date: 10/04/2020 CLINICAL DATA:  Prior right thoracentesis. EXAM: PORTABLE CHEST 1 VIEW COMPARISON:  10/03/2020.   10/02/2020. FINDINGS: AICD noted stable position. Stable cardiomegaly. Bilateral interstitial infiltrates/edema cannot be excluded. Right pleural effusion is stable. Stable loculated left pleural effusion. Left base calcified pleural plaques again noted. No pneumothorax noted on today's exam. IMPRESSION: 1. Stable right pleural effusion. No pneumothorax noted on today's exam. Stable loculated left pleural effusion. 2. AICD noted stable position. Stable cardiomegaly. Bilateral interstitial edema/infiltrates cannot be excluded. Electronically Signed   By: Maisie Fus  Register   On: 10/04/2020 08:24   DG Chest Port 1 View  Result Date: 10/03/2020 CLINICAL DATA:  Status post thoracentesis. EXAM: PORTABLE CHEST 1 VIEW COMPARISON:  Same day. FINDINGS: Stable cardiomediastinal silhouette. Right pleural effusion is significantly smaller status post thoracentesis. Possible small right apical pneumothorax is noted which most likely represents ex vacuo pneumothorax. Stable right lung opacity is noted. IMPRESSION: Right pleural effusion is significantly smaller status post thoracentesis. Possible small right apical pneumothorax is noted which most likely represents ex vacuo pneumothorax. Stable right lung opacity. Electronically Signed   By: Lupita Raider M.D.   On: 10/03/2020 16:10   DG Chest Port 1 View  Result Date: 10/02/2020 CLINICAL DATA:  Fatigue EXAM: PORTABLE CHEST 1 VIEW COMPARISON:  09/30/2020 FINDINGS: There are persistent bilateral loculated pleural effusions which appear to have increased on the right but are stable on the left. Pleural base calcifications are again noted. There is no pneumothorax. The heart size is stable. Aortic calcifications are noted. There is a biventricular pacemaker in place. There is no acute osseous abnormality. IMPRESSION: Persistent bilateral loculated pleural effusions, increased on the right but stable on the left. Electronically Signed   By: Katherine Mantle M.D.   On:  10/02/2020 18:43   DG Chest Port 1 View  Result Date: 09/30/2020 CLINICAL DATA:  Status post right-sided thoracentesis. EXAM: PORTABLE CHEST 1 VIEW COMPARISON:  CT a chest in chest x-ray from yesterday. FINDINGS: Unchanged left chest wall pacemaker. Stable cardiomegaly and pulmonary artery enlargement. Loculated bilateral pleural effusions again noted, slightly decreased on the right status post thoracentesis. No pneumothorax. Unchanged pleural calcification at the lung bases and bilateral lower lobe atelectasis. No acute osseous abnormality. IMPRESSION: 1. Loculated bilateral pleural effusions, slightly decreased on the right status post thoracentesis. No pneumothorax. Electronically Signed   By: Obie Dredge M.D.   On: 09/30/2020 12:34   DG Chest Portable 1 View  Result Date: 09/29/2020 CLINICAL DATA:  Dyspnea EXAM: PORTABLE CHEST 1 VIEW COMPARISON:  None. FINDINGS: Bilateral basilar laterally loculated pleural effusions are present, moderate on the right and small on the left. Left basilar pleural calcifications are identified. There is enlargement of the right hilum suggesting presence of right hilar mass or right hilar adenopathy. There is partial right lower lobe collapse. Opacity within the left perihilar region may represent fluid within the left major fissure. A left perihilar mass, however, is difficult to exclude. No pneumothorax. Mild cardiomegaly. Left subclavian 3 lead pacemaker in place. Vascular calcifications are seen within the abdominal aorta. The pulmonary vascularity is normal. No acute bone abnormality. IMPRESSION: Suspected right hilar mass or adenopathy with subtotal collapse of the right lower lobe possibly representing postobstructive collapse. Bilateral loculated pleural effusions, moderate on the right and small on the left. Coarse left basilar pleural calcification may relate to remote trauma or infection. Left perihilar opacity possibly representing fluid within the fissure  or a left perihilar focal pulmonary mass. This would be  better assessed with dedicated contrast enhanced CT imaging. Mild cardiomegaly. Electronically Signed   By: Helyn Numbers MD   On: 09/29/2020 11:08   ECHOCARDIOGRAM COMPLETE  Result Date: 09/30/2020    ECHOCARDIOGRAM REPORT   Patient Name:   Patrick Mccormick Date of Exam: 09/30/2020 Medical Rec #:  [id number]       Height:       67.0 in Accession #:    [id number]      Weight:       88.3 lb Date of Birth:  1931/12/28       BSA:          1.429 m Patient Age:    88 years        BP:           87/50 mmHg Patient Gender: M               HR:           76 bpm. Exam Location:  [facility] Procedure: 2D Echo, Color Doppler and Cardiac Doppler Indications:     R06.00 Dyspnea  History:         Patient has no prior history of Echocardiogram examinations.                  Arrythmias:Paroxymal atrial fibrillation,                  Signs/Symptoms:Shortness of Breath; Risk Factors:Hypertension.  Sonographer:     Humphrey Rolls RDCS (AE) Referring Phys:  [id number] Patrick N Mccormick Diagnosing Phys: Harold Hedge MD  Sonographer Comments: TDS due to bony thorax. IMPRESSIONS  1. Left ventricular ejection fraction, by estimation, is 65 to 70%. The left ventricle has normal function. The left ventricle has no regional wall motion abnormalities. Left ventricular diastolic parameters were normal.  2. Right ventricular systolic function is normal. The right ventricular size is mildly enlarged.  3. Left atrial size was mildly dilated.  4. Right atrial size was mildly dilated.  5. The mitral valve is grossly normal. Mild to moderate mitral valve regurgitation.  6. The aortic valve is calcified. Aortic valve regurgitation is trivial. Mild to moderate aortic valve sclerosis/calcification is present, without any evidence of aortic stenosis. FINDINGS  Left Ventricle: Left ventricular ejection fraction, by estimation, is 65 to 70%. The left ventricle has normal function. The left ventricle has no regional wall  motion abnormalities. The left ventricular internal cavity size was normal in size. There is  borderline left ventricular hypertrophy. Left ventricular diastolic parameters were normal. Right Ventricle: The right ventricular size is mildly enlarged. No increase in right ventricular wall thickness. Right ventricular systolic function is normal. Left Atrium: Left atrial size was mildly dilated. Right Atrium: Right atrial size was mildly dilated. Pericardium: There is no evidence of pericardial effusion. Mitral Valve: The mitral valve is grossly normal. Mild to moderate mitral valve regurgitation. MV peak gradient, 3.1 mmHg. The mean mitral valve gradient is 1.0 mmHg. Tricuspid Valve: The tricuspid valve is not well visualized. Tricuspid valve regurgitation is trivial. Aortic Valve: The aortic valve is calcified. Aortic valve regurgitation is trivial. Mild to moderate aortic valve sclerosis/calcification is present, without any evidence of aortic stenosis. Aortic valve mean gradient measures 3.0 mmHg. Aortic valve peak  gradient measures 5.9 mmHg. Aortic valve area, by VTI measures 1.71 cm. Pulmonic Valve: The pulmonic valve was not well visualized. Pulmonic valve regurgitation is trivial. Aorta: The aortic root is normal in size and structure. IAS/Shunts: No atrial level  shunt detected by color flow Doppler. Additional Comments: A pacer wire is visualized.  LEFT VENTRICLE PLAX 2D LVIDd:         3.50 cm LVIDs:         2.10 cm LV PW:         1.10 cm LV IVS:        0.80 cm LVOT diam:     2.20 cm LV SV:         38 LV SV Index:   26 LVOT Area:     3.80 cm  RIGHT VENTRICLE RV Basal diam:  4.80 cm LEFT ATRIUM             Index       RIGHT ATRIUM           Index LA diam:        4.10 cm 2.87 cm/m  RA Area:     30.00 cm LA Vol (A2C):   40.4 ml 28.28 ml/m RA Volume:   112.00 ml 78.39 ml/m LA Vol (A4C):   92.3 ml 64.61 ml/m LA Biplane Vol: 65.3 ml 45.71 ml/m  AORTIC VALVE                   PULMONIC VALVE AV Area (Vmax):     1.97 cm    PV Vmax:       0.68 m/s AV Area (Vmean):   1.92 cm    PV Vmean:      42.900 cm/s AV Area (VTI):     1.71 cm    PV VTI:        0.128 m AV Vmax:           121.00 cm/s PV Peak grad:  1.9 mmHg AV Vmean:          79.000 cm/s PV Mean grad:  1.0 mmHg AV VTI:            0.220 m AV Peak Grad:      5.9 mmHg AV Mean Grad:      3.0 mmHg LVOT Vmax:         62.70 cm/s LVOT Vmean:        40.000 cm/s LVOT VTI:          0.099 m LVOT/AV VTI ratio: 0.45  AORTA Ao Root diam: 3.50 cm MITRAL VALVE MV Area (PHT): 6.96 cm    SHUNTS MV Area VTI:   2.36 cm    Systemic VTI:  0.10 m MV Peak grad:  3.1 mmHg    Systemic Diam: 2.20 cm MV Mean grad:  1.0 mmHg MV Vmax:       0.88 m/s MV Vmean:      47.8 cm/s MV Decel Time: 109 msec MV E velocity: 75.80 cm/s MV A velocity: 36.80 cm/s MV E/A ratio:  2.06 Harold Hedge MD Electronically signed by Harold Hedge MD Signature Date/Time: 09/30/2020/11:36:34 AM    Final    US LIVER DOPPLER  Result Date: 10/02/2020 CLINICAL DATA:  Hepatic cirrhosis EXAM: DUPLEX ULTRASOUND OF LIVER TECHNIQUE: Color and duplex Doppler ultrasound was performed to evaluate the hepatic in-flow and out-flow vessels. COMPARISON:  09/30/2020 CT with contrast FINDINGS: Liver: Increased hepatic echogenicity. No definite contour abnormality or significant surface nodularity. No focal lesion, mass or intrahepatic biliary ductal dilatation. Main Portal Vein size: 0.9 cm Portal Vein Velocities Main Prox:  21 cm/sec Main Mid: 40 cm/sec Main Dist:  36 cm/sec Right: 32 cm/sec Left: 24 cm/sec Hepatic Vein Velocities Right:  51 cm/sec Middle:  58 cm/sec Left:  51 cm/sec IVC: Present and patent with normal respiratory phasicity. Hepatic Artery Velocity:  71 cm/sec Splenic Vein Velocity:  14 cm/sec Spleen: 4 cm x 4 cm x 6 cm with a total volume of 62 cm^3 (411 cm^3 is upper limit normal) Portal Vein Occlusion/Thrombus: No Splenic Vein Occlusion/Thrombus: No Ascites: None Varices: None IMPRESSION: Hepatic veins and IVC are  dilated suggesting component of right heart failure. Portal, hepatic and splenic veins are all patent with normal directional flow. No veno-occlusive process. Electronically Signed   By: Judie Petit.  Shick M.D.   On: 10/02/2020 11:19   US THORACENTESIS ASP PLEURAL SPACE W/IMG GUIDE  Result Date: 10/04/2020 INDICATION: Patient with history of pulmonary hypertension and persistent AFib. Found to have a recurrent right-sided pleural effusion. Team is requesting therapeutic and diagnostic thoracentesis EXAM: ULTRASOUND GUIDED THERAPEUTIC AND DIAGNOSTIC THORACENTESIS MEDICATIONS: Lidocaine 1% 10 mL COMPLICATIONS: None immediate. PROCEDURE: An ultrasound guided thoracentesis was thoroughly discussed with the patient and questions answered. The benefits, risks, alternatives and complications were also discussed. The patient understands and wishes to proceed with the procedure. Written consent was obtained. Ultrasound was performed to localize and mark an adequate pocket of fluid in the right chest. The area was then prepped and draped in the normal sterile fashion. 1% Lidocaine was used for local anesthesia. Under ultrasound guidance a 6 Fr Safe-T-Centesis catheter was introduced. Thoracentesis was performed. The catheter was removed and a dressing applied. FINDINGS: A total of approximately 350 mL of amber colored fluid was removed. Samples were sent to the laboratory as requested by the clinical team. IMPRESSION: Successful ultrasound guided therapeutic and diagnostic right sided thoracentesis yielding 350 mL of pleural fluid. Read by: Anders Grant, NP Electronically Signed   By: Acquanetta Belling M.D.   On: 10/03/2020 16:35   US THORACENTESIS ASP PLEURAL SPACE W/IMG GUIDE  Result Date: 09/30/2020 INDICATION: Patient with history of pulmonary hypertension and persistent AFib. Found to have bilateral pleural effusions after presenting to the emergency department with shortness of breath. Team is requesting therapeutic and  diagnostic thoracentesis EXAM: ULTRASOUND GUIDED RIGHT-SIDED THERAPEUTIC AND DIAGNOSTIC THORACENTESIS MEDICATIONS: Lidocaine 1% 10 mL COMPLICATIONS: None immediate. PROCEDURE: An ultrasound guided thoracentesis was thoroughly discussed with the patient and questions answered. The benefits, risks, alternatives and complications were also discussed. The patient understands and wishes to proceed with the procedure. Written consent was obtained. Ultrasound was performed to localize and mark an adequate pocket of fluid in the right chest. The area was then prepped and draped in the normal sterile fashion. 1% Lidocaine was used for local anesthesia. Under ultrasound guidance a 6 Fr Safe-T-Centesis catheter was introduced. Thoracentesis was performed. The catheter was removed and a dressing applied. FINDINGS: A total of approximately 350 mL of amber colored fluid was removed. Samples were sent to the laboratory as requested by the clinical team. Patient unable to tolerate additional fluid removal and per patient request the procedure was terminated. IMPRESSION: Successful ultrasound guided therapeutic and diagnostic right-sided thoracentesis yielding 350 mL of pleural fluid. Read by: Anders Grant, NP Electronically Signed   By: Simonne Come M.D.   On: 09/30/2020 12:54    EKG: Ventricular pacing  ASSESSMENT AND PLAN:   1.  Right-sided heart failure consistent with cor pulmonale, and patient with history of pulmonary hypertension, on sildenafil.  2D echocardiogram reveals preserved left ventricular function, with LVEF 65 to 70%. 2.  Chronic atrial fibrillation, on Eliquis for stroke prevention, and atenolol for rate  control 3.  Sick sinus syndrome, status post pacemaker with ventricular pacing 4.  Acute kidney injury with underlying chronic kidney disease 5.  Subacute/chronic pulmonary embolism 6.  Failure to thrive/nutrition/cachexia  Recommendations  1.  Agree with overall current therapy 2.  Continue  Eliquis for stroke prevention, as well as, treatment for subacute to chronic pulmonary embolus 3.  Continue furosemide drip 4.  Carefully monitor renal status 5.  Agree with milrinone drip, though skeptical that it will make significant clinical impact in patient with preserved left ventricular function, right-sided heart failure with cor pulmonale and pulmonary hypertension, in setting of malnutrition and cachexia, complicated by kidney injury   Signed: Marcina Millard MD,PhD, [facility] 10/04/2020, 3:49 PM

## 2020-10-05 DIAGNOSIS — Z7189 Other specified counseling: Secondary | ICD-10-CM | POA: Diagnosis not present

## 2020-10-05 DIAGNOSIS — J9601 Acute respiratory failure with hypoxia: Secondary | ICD-10-CM | POA: Diagnosis not present

## 2020-10-05 DIAGNOSIS — Z903 Acquired absence of stomach [part of]: Secondary | ICD-10-CM | POA: Diagnosis not present

## 2020-10-05 DIAGNOSIS — E43 Unspecified severe protein-calorie malnutrition: Secondary | ICD-10-CM | POA: Diagnosis not present

## 2020-10-05 DIAGNOSIS — Z515 Encounter for palliative care: Secondary | ICD-10-CM | POA: Diagnosis not present

## 2020-10-05 DIAGNOSIS — I509 Heart failure, unspecified: Secondary | ICD-10-CM | POA: Diagnosis not present

## 2020-10-05 LAB — HEPATIC FUNCTION PANEL
ALT: 24 U/L (ref 0–44)
AST: 35 U/L (ref 15–41)
Albumin: 3.6 g/dL (ref 3.5–5.0)
Alkaline Phosphatase: 104 U/L (ref 38–126)
Bilirubin, Direct: 0.2 mg/dL (ref 0.0–0.2)
Indirect Bilirubin: 0.7 mg/dL (ref 0.3–0.9)
Total Bilirubin: 0.9 mg/dL (ref 0.3–1.2)
Total Protein: 6.8 g/dL (ref 6.5–8.1)

## 2020-10-05 LAB — COMP PANEL: LEUKEMIA/LYMPHOMA

## 2020-10-05 LAB — BASIC METABOLIC PANEL
Anion gap: 13 (ref 5–15)
Anion gap: 10 (ref 5–15)
BUN: 101 mg/dL — ABNORMAL HIGH (ref 8–23)
BUN: 104 mg/dL — ABNORMAL HIGH (ref 8–23)
CO2: 23 mmol/L (ref 22–32)
CO2: 25 mmol/L (ref 22–32)
Calcium: 10.3 mg/dL (ref 8.9–10.3)
Calcium: 10.1 mg/dL (ref 8.9–10.3)
Chloride: 87 mmol/L — ABNORMAL LOW (ref 98–111)
Chloride: 86 mmol/L — ABNORMAL LOW (ref 98–111)
Creatinine, Ser: 4.44 mg/dL — ABNORMAL HIGH (ref 0.61–1.24)
Creatinine, Ser: 4.6 mg/dL — ABNORMAL HIGH (ref 0.61–1.24)
GFR, Estimated: 12 mL/min — ABNORMAL LOW (ref >60)
GFR, Estimated: 12 mL/min — ABNORMAL LOW (ref >60)
Glucose, Bld: 140 mg/dL — ABNORMAL HIGH (ref 70–99)
Glucose, Bld: 139 mg/dL — ABNORMAL HIGH (ref 70–99)
Potassium: 5.6 mmol/L — ABNORMAL HIGH (ref 3.5–5.1)
Potassium: 5 mmol/L (ref 3.5–5.1)
Sodium: 123 mmol/L — ABNORMAL LOW (ref 135–145)
Sodium: 121 mmol/L — ABNORMAL LOW (ref 135–145)

## 2020-10-05 LAB — VITAMIN B12: Vitamin B-12: 1276 pg/mL — ABNORMAL HIGH (ref 180–914)

## 2020-10-05 LAB — CHOLESTEROL, BODY FLUID: Cholesterol, Fluid: 26 mg/dL

## 2020-10-05 LAB — COPPER, SERUM: Copper: 210 ug/dL — ABNORMAL HIGH (ref 69–132)

## 2020-10-05 LAB — CYTOLOGY - NON PAP

## 2020-10-05 LAB — SODIUM: Sodium: 122 mmol/L — ABNORMAL LOW (ref 135–145)

## 2020-10-05 MED ORDER — TOLVAPTAN 15 MG PO TABS
15.0000 mg | ORAL_TABLET | Freq: Once | ORAL | Status: AC
  Administered 2020-10-05: 15 mg via ORAL
  Filled 2020-10-05: qty 1

## 2020-10-05 MED ORDER — HYDROCORTISONE NA SUCCINATE PF 100 MG IJ SOLR
50.0000 mg | Freq: Every day | INTRAMUSCULAR | Status: AC
  Administered 2020-10-06: 50 mg via INTRAVENOUS
  Filled 2020-10-05: qty 1

## 2020-10-05 MED ORDER — THIAMINE HCL 100 MG/ML IJ SOLN
INTRAVENOUS | Status: AC
  Filled 2020-10-05: qty 1000

## 2020-10-05 NOTE — Progress Notes (Signed)
[facility] Cardiology  SUBJECTIVE: Patient laying in bed, confused, not answering questions appropriately, working his TV remote while the TV is off   Vitals:   10/04/20 1948 10/05/20 0214 10/05/20 0405 10/05/20 0721  BP: 109/65 112/71 124/78 125/75  Pulse: 60 (!) 59 61 60  Resp: 18 18 18 16   Temp: 97.7 F (36.5 C) 97.9 F (36.6 C) (!) 97.4 F (36.3 C) (!) 97.4 F (36.3 C)  TempSrc: Oral Oral Oral Oral  SpO2: 100% 100% 99% 93%  Weight:   39.9 kg   Height:         Intake/Output Summary (Last 24 hours) at 10/05/2020 0849 Last data filed at 10/04/2020 2100 Gross per 24 hour  Intake --  Output 150 ml  Net -150 ml      PHYSICAL EXAM  General: Well developed, well nourished, in no acute distress HEENT:  Normocephalic and atramatic Neck:  No JVD.  Lungs: Clear bilaterally to auscultation and percussion. Heart: HRRR . Normal S1 and S2 without gallops or murmurs.  Abdomen: Bowel sounds are positive, abdomen soft and non-tender  Msk:  Back normal, normal gait. Normal strength and tone for age. Extremities: No clubbing, cyanosis or edema.   Neuro: Alert and oriented X 3. Psych:  Good affect, responds appropriately   LABS: Basic Metabolic Panel: Recent Labs    10/03/20 0409 10/04/20 0502 10/04/20 1353  NA 120* 123*  --   K 5.2* 5.6* 5.3*  CL 89* 88*  --   CO2 22 25  --   GLUCOSE 133* 122*  --   BUN 70* 87*  --   CREATININE 3.02* 3.77*  --   CALCIUM 9.5 10.2  --    Liver Function Tests: Recent Labs    10/03/20 0409 10/04/20 0502  AST 25 28  ALT 13 17  ALKPHOS 109 100  BILITOT 0.6 0.6  PROT 6.2* 6.3*  ALBUMIN 3.3* 3.3*   No results for input(s): LIPASE, AMYLASE in the last 72 hours. CBC: Recent Labs    10/03/20 0409  WBC 7.7  HGB 9.6*  HCT 27.4*  MCV 94.5  PLT 148*   Cardiac Enzymes: No results for input(s): CKTOTAL, CKMB, CKMBINDEX, TROPONINI in the last 72 hours. BNP: Invalid input(s): POCBNP D-Dimer: No results for input(s): DDIMER in the last 72  hours. Hemoglobin A1C: No results for input(s): HGBA1C in the last 72 hours. Fasting Lipid Panel: No results for input(s): CHOL, HDL, LDLCALC, TRIG, CHOLHDL, LDLDIRECT in the last 72 hours. Thyroid Function Tests: No results for input(s): TSH, T4TOTAL, T3FREE, THYROIDAB in the last 72 hours.  Invalid input(s): FREET3 Anemia Panel: No results for input(s): VITAMINB12, FOLATE, FERRITIN, TIBC, IRON, RETICCTPCT in the last 72 hours.  DG Chest 2 View  Result Date: 10/03/2020 CLINICAL DATA:  Acute renal injury EXAM: CHEST - 2 VIEW COMPARISON:  10/02/2020 FINDINGS: LEFT-sided pacemaker overlies stable cardiac silhouette. Large bilateral loculated pleural effusions. The LEFT effusion appears slightly decreased. No pneumothorax. Calcified pleural plaque over the LEFT hemidiaphragm. IMPRESSION: 1. Bilateral large loculated pleural effusions. 2. LEFT effusion may be slightly reduced. 3. No pulmonary edema or pneumothorax. Electronically Signed   By: 10/04/2020 M.D.   On: 10/03/2020 10:23   10/05/2020 RENAL  Result Date: 10/03/2020 CLINICAL DATA:  Acute kidney injury in a 87 year old male EXAM: RENAL / URINARY TRACT ULTRASOUND COMPLETE COMPARISON:  Abdomen and pelvis CT from 2022, February 2022 FINDINGS: Right Kidney: Renal measurements: 9.0 x 4.1 x 5.7 cm = volume: 110 mL. Slight  increased echogenicity of renal cortex. Cyst in the interpolar aspect of the RIGHT kidney 2.5 x 2.4 x 1.4 cm. No hydronephrosis. Left Kidney: Renal measurements: 9.6 x 4.8 x 4.8 cm = volume: 114 mL. Mild increased echogenicity the without hydronephrosis or focal lesion aside from a small cyst in the lower pole. Bladder: Under distended urinary bladder with small volume fluid in the pelvis, small volume fluid in the pelvis seen on prior imaging Other: Small volume ascites in the pelvis as discussed. IMPRESSION: Mild increased cortical echogenicity as can be seen in the setting of medical renal disease. No hydronephrosis. Small volume  pelvic ascites. Electronically Signed   By: Donzetta Kohut M.D.   On: 10/03/2020 10:52   DG Chest Port 1 View  Result Date: 10/04/2020 CLINICAL DATA:  Prior right thoracentesis. EXAM: PORTABLE CHEST 1 VIEW COMPARISON:  10/03/2020.  10/02/2020. FINDINGS: AICD noted stable position. Stable cardiomegaly. Bilateral interstitial infiltrates/edema cannot be excluded. Right pleural effusion is stable. Stable loculated left pleural effusion. Left base calcified pleural plaques again noted. No pneumothorax noted on today's exam. IMPRESSION: 1. Stable right pleural effusion. No pneumothorax noted on today's exam. Stable loculated left pleural effusion. 2. AICD noted stable position. Stable cardiomegaly. Bilateral interstitial edema/infiltrates cannot be excluded. Electronically Signed   By: Maisie Fus  Register   On: 10/04/2020 08:24   DG Chest Port 1 View  Result Date: 10/03/2020 CLINICAL DATA:  Status post thoracentesis. EXAM: PORTABLE CHEST 1 VIEW COMPARISON:  Same day. FINDINGS: Stable cardiomediastinal silhouette. Right pleural effusion is significantly smaller status post thoracentesis. Possible small right apical pneumothorax is noted which most likely represents ex vacuo pneumothorax. Stable right lung opacity is noted. IMPRESSION: Right pleural effusion is significantly smaller status post thoracentesis. Possible small right apical pneumothorax is noted which most likely represents ex vacuo pneumothorax. Stable right lung opacity. Electronically Signed   By: Lupita Raider M.D.   On: 10/03/2020 16:10   US THORACENTESIS ASP PLEURAL SPACE W/IMG GUIDE  Result Date: 10/04/2020 INDICATION: Patient with history of pulmonary hypertension and persistent AFib. Found to have a recurrent right-sided pleural effusion. Team is requesting therapeutic and diagnostic thoracentesis EXAM: ULTRASOUND GUIDED THERAPEUTIC AND DIAGNOSTIC THORACENTESIS MEDICATIONS: Lidocaine 1% 10 mL COMPLICATIONS: None immediate. PROCEDURE: An  ultrasound guided thoracentesis was thoroughly discussed with the patient and questions answered. The benefits, risks, alternatives and complications were also discussed. The patient understands and wishes to proceed with the procedure. Written consent was obtained. Ultrasound was performed to localize and mark an adequate pocket of fluid in the right chest. The area was then prepped and draped in the normal sterile fashion. 1% Lidocaine was used for local anesthesia. Under ultrasound guidance a 6 Fr Safe-T-Centesis catheter was introduced. Thoracentesis was performed. The catheter was removed and a dressing applied. FINDINGS: A total of approximately 350 mL of amber colored fluid was removed. Samples were sent to the laboratory as requested by the clinical team. IMPRESSION: Successful ultrasound guided therapeutic and diagnostic right sided thoracentesis yielding 350 mL of pleural fluid. Read by: Anders Grant, NP Electronically Signed   By: Acquanetta Belling M.D.   On: 10/03/2020 16:35     Echo LVEF 65 to 70%  TELEMETRY: Ventricular pacing:  ASSESSMENT AND PLAN:  Principal Problem:   Respiratory distress Active Problems:   Pleural effusion due to congestive heart failure (HCC)   Pulmonary hypertension (HCC)   Atrial fibrillation, chronic (HCC)   History of partial gastrectomy   Protein-calorie malnutrition, severe    1.  Respiratory failure, multifactorial, secondary to right-sided heart failure with cor pulmonale, underlying pulmonary hypertension deviously followed [facility], recurrent pleural effusions, with subacute/chronic pulmonary embolism, with normal left ventricular function, 2D echocardiogram reveals LVEF 65 to 70% 2.  Chronic atrial fibrillation, on Eliquis for stroke prevention, and atenolol for rate control 3.  Sick sinus syndrome, status post pacemaker, with ventricular pacing 4.  Acute kidney injury with underlying chronic kidney disease stage IIIb, with worsening renal  function 5.  Subacute/chronic pulmonary embolism, low-dose Eliquis uptitrated to therapeutic dosing 6.  Failure to thrive / malnutrition / cachexia / decreased mental status 7.  Multiorgan failure with poor prognosis  Recommendations  1.  Agree with overall current therapy 2.  Continue Eliquis for stroke prevention and event of pulmonary embolism 3.  Continue furosemide drip for now, await nephrology recommendations 4.  Carefully monitor renal status 5.  Agree with palliative care involvement  Marcina Millard, MD, PhD, [facility] 10/05/2020 8:49 AM

## 2020-10-05 NOTE — Progress Notes (Signed)
[facility] PROGRESS NOTE    Patrick Mccormick  ZOX:[id number] DOB: February 23, 1932 DOA: 09/29/2020 PCP: Conan Bowens., MD      Brief Narrative:  [name] is a 87 y.o. M with PH on sildenafil with [facility], A. 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  In the ER, he was hypoxic to the 80s.  CTA showed PE and so he was started on heparin.          Assessment & Plan:  Acute hypoxic respiratory failure in setting of worsening pleural effusion, chronic PE and known pulmonary hypertension/right heart failure Has completed thoracentesis x2, transudative fluid, 700cc total.  Weaned to 2L.  CXR yesterday showed residual bilateral effusions.  -IS as able -Continue sildenafil -Consult Pulm, appreciate cares   Acute renal failure on CKD IIIa Baseline Cr 1.5, CKD IIIa (ckd iiib ruled out). Now up to 3.7 yesterday, today up to >4.  UOP poor.  No ultrasound shows expected medical renal disease, no hydronephrosis. Urinalysis bland, PC ratio 1.6  Suspect right heart failure/congestion nephropathy.  Other Differential is MM, ischemic injury -Consult Nephrology appreciate cares -Stop Lasix -Start Tolvaptan -Strict I/Os -Daily BMP  -Follow-up SPEP, light chains    Hypotension -Continue midodrine -Continue Solu-cortef -Hold home atenolol  Hyperkalemia K today is unchanged at 5.6 -Continue lokelma   Hyponatremia Hypervolemic.  Urine sodium inappropriately low. -Tolvaptan   Acute metabolic encephalopathy Likely due to malnutrition with now progressive renal failure and sleep deprivation.  No focal deficits.   Weight loss Severe protein calorie malnutrition This actually appears to be his chief life-threatening  problem.  Appears to have begun over the last 12-16 months, as the timeline below shows, and family have noted to multiple providers that they perceived it to begin after his gastrectomy for perforated gastric ulcer in 2020:  4/19 - 71kg 11/19 - 75 kg 1/20 - 69 kg 4/20 - 67 kg 01/04/19 - Gastrectomy (details from this Op Note are at the bottom of this note) 10/20 - 74 kg 1/21 - 70 kg 2/21 - 65 kg 3/21 - 61 kg 4/21 - 54 kg 6/21 - 53 kg 9/21 - 48 kg 11/21 - 47 kg Now - 40 kg  During this time, the patient has had poor PO intake, mostly due to reduced appetite.  He has had no prominent diarrhea, no hematochezia or mucus in the stool although I can find no history of colonoscopy, son states his last colonoscopy was a few years ago.    No vomiting, no bloating or indigestion.  By last August, he was down to 48 kg, and J tube was discussed, but this referral was not made as the patient told daughter "if I get a feeding tube, it means I'm admitting that I'm dying."  At this time, he is down to 39.9 kg, BMI 13.  Copper and B12 levels normal, Nephrology note, serum copper can be falsely elevated in liver disease.  B12 replete.  Vit D and A and C pending.  -Continue Magic cup -Continue Ensure TID -Continue vitamin C and MVI -Continue mirtazapine  -Consult dietitian --> OBTAIN CALORIE COUNT, patient appears to have good PO intake here, albumin >3, but calorie intake needs to be scrutinized  Pulmonary hypertension -Continue sildenafil  Chronic atrial fibrillation Sick sinus syndrome pacemaker in place -Continue apixaban   Gout -Continue allopurinol  Hyperlipidemia -Continue atrovastatin  Hypothyroidism TSH only slightly high, free T4 normal, T3 slightly low. -Continue levothyroxine   Anemia of chronic disease, normocytic Hgb stable, no clinicla bleeding       Disposition: Status is: Inpatient  Remains inpatient appropriate because:Persistent severe  electrolyte disturbances   Dispo: The patient is from: Home              Anticipated d/c is to: unclear              Anticipated d/c date is: > 3 days              Patient currently is not medically stable to d/c.   Difficult to place patient No       Level of care: Progressive Cardiac       MDM: The below labs and imaging reports were reviewed and summarized above.  Medication management as above.  Outside records reviewed, case discussed with Dr. Wynelle Link.    DVT prophylaxis: Place TED hose Start: 09/29/20 1300 apixaban (ELIQUIS) tablet 10 mg  apixaban (ELIQUIS) tablet 5 mg  Code Status: Partial Family Communication: daughter at bedside, son by phone    Consultants:   Pulmonology  Cardiology  Vascular surgery  Palliative care  Nephrology  Procedures:   2/11 thoracentesis 350 cc out, unable to tolerate more -- transudate  2/11 echo EF normal, valves normal, RVSF normal, RH mildly enlarged  2/12 Liver doppler -- c/w right heart failure, otherwise unremarkable  2/14 renal US -- medicorenal disease  2/14 thoracentesis 350 cc more out -- transudate  Antimicrobials:   None   Culture data:   2/11 pleural fluid culture -- ng   2/14 pleural fluid culture -- ng          Subjective: The patient is confused and sleepy.  He has had no fever.  He still on oxygen.  He has no complaints.  He has had no stools.  No vomiting or respiratory distress.  Objective: Vitals:   10/05/20 0214 10/05/20 0405 10/05/20 0721 10/05/20 1140  BP: 112/71 124/78 125/75 117/64  Pulse: (!) 59 61 60 60  Resp: 18 18 16 15   Temp: 97.9 F (36.6 C) (!) 97.4 F (36.3 C) (!) 97.4 F (36.3 C) 97.9 F (36.6 C)  TempSrc: Oral Oral Oral   SpO2: 100% 99% 93% 100%  Weight:  39.9 kg    Height:        Intake/Output Summary (Last 24 hours) at 10/05/2020 1540 Last data filed at 10/04/2020 2100 Gross per 24 hour  Intake -  Output 150 ml  Net -150 ml   Filed Weights    10/03/20 0501 10/04/20 0358 10/05/20 0405  Weight: 39.1 kg 39.4 kg 39.9 kg    Examination: General appearance: Cachectic adult male, awake but disoriented, being bathed, in acute distress.   HEENT: Anicteric, conjunctiva pink, lids and lashes normal. No nasal deformity, discharge, epistaxis.  Lips moist, dentition in good repair, oropharynx tacky dry, no oral lesions, hearing seems normal.   Skin: Warm and dry.  No jaundice.  No suspicious rashes or lesions.  Scattered senile purpura.  No curled hairs, no petechiae. Cardiac: RRR, nl S1-S2, no murmurs appreciated hyperdynamic PMI.  Capillary refill is brisk.  JVPnot visible.  1+ bilateral LE edema.  Radial pulses 2+ and symmetric. Respiratory: Normal respiratory rate and rhythm.  CTAB  without rales or wheezes.  Diminished at both bases. Abdomen: Abdomen soft.  Moderate ascites, not tense.  No obvious grimace to palpation.  Did not appreciate hepatosplenomegaly.   MSK: No deformities or effusions.  Cachectic, severe temporal wasting, thenar wasting, loss of subcutaneous muscle mass and fat Neuro: Awake but sluggish, rambling nonsensically.  EOMI, moves all extremities with severe generalized weakness.  He has some myoclonic jerks due to weakness. Speech fluent.    Psych: Sensorium intact and responding to questions, attention diminished, affect blunted, judgment insight appear impaired, oriented only to self, sort of oriented to place         Data Reviewed: I have personally reviewed following labs and imaging studies:  CBC: Recent Labs  Lab 09/29/20 1102 09/30/20 0432 10/01/20 0432 10/02/20 0444 10/03/20 0409  WBC 4.0 3.8* 4.7 5.2 7.7  NEUTROABS 2.8  --   --   --   --   HGB 11.4* 10.2* 9.8* 9.0* 9.6*  HCT 33.1* 28.4* 28.8* 25.9* 27.4*  MCV 93.8 91.0 94.4 94.9 94.5  PLT 157 128* 130* 134* 148*   Basic Metabolic Panel: Recent Labs  Lab 10/02/20 0444 10/03/20 0409 10/04/20 0502 10/04/20 1353 10/05/20 0651 10/05/20 1445  NA  120* 120* 123*  --  123* 121*  K 4.7 5.2* 5.6* 5.3* 5.6* 5.0  CL 89* 89* 88*  --  87* 86*  CO2 24 22 25   --  23 25  GLUCOSE 148* 133* 122*  --  140* 139*  BUN 51* 70* 87*  --  101* 104*  CREATININE 2.28* 3.02* 3.77*  --  4.44* 4.60*  CALCIUM 9.1 9.5 10.2  --  10.3 10.1   GFR: Estimated Creatinine Clearance: 6.5 mL/min (A) (by C-G formula based on SCr of 4.6 mg/dL (H)). Liver Function Tests: Recent Labs  Lab 09/29/20 1102 10/02/20 0444 10/03/20 0409 10/04/20 0502 10/05/20 1445  AST 33 34 25 28 35  ALT 14 16 13 17 24   ALKPHOS 137* 105 109 100 104  BILITOT 1.5* 1.0 0.6 0.6 0.9  PROT 7.0 6.0* 6.2* 6.3* 6.8  ALBUMIN 3.5 3.2* 3.3* 3.3* 3.6   No results for input(s): LIPASE, AMYLASE in the last 168 hours. No results for input(s): AMMONIA in the last 168 hours. Coagulation Profile: Recent Labs  Lab 09/29/20 1102  INR 1.6*   Cardiac Enzymes: No results for input(s): CKTOTAL, CKMB, CKMBINDEX, TROPONINI in the last 168 hours. BNP (last 3 results) No results for input(s): PROBNP in the last 8760 hours. HbA1C: No results for input(s): HGBA1C in the last 72 hours. CBG: Recent Labs  Lab 09/30/20 0909 09/30/20 0957 09/30/20 1126 10/01/20 2123  GLUCAP 37* 119* 93 217*   Lipid Profile: No results for input(s): CHOL, HDL, LDLCALC, TRIG, CHOLHDL, LDLDIRECT in the last 72 hours. Thyroid Function Tests: No results for input(s): TSH, T4TOTAL, FREET4, T3FREE, THYROIDAB in the last 72 hours. Anemia Panel: Recent Labs    10/05/20 0651  VITAMINB12 1,276*   Urine analysis:    Component Value Date/Time   COLORURINE AMBER (A) 10/04/2020 2210   APPEARANCEUR CLOUDY (A) 10/04/2020 2210   LABSPEC 1.024 10/04/2020 2210   PHURINE 5.0 10/04/2020 2210   GLUCOSEU NEGATIVE 10/04/2020 2210   HGBUR NEGATIVE 10/04/2020 2210   BILIRUBINUR NEGATIVE 10/04/2020 2210   KETONESUR NEGATIVE 10/04/2020 2210   PROTEINUR 30 (A) 10/04/2020 2210   NITRITE NEGATIVE 10/04/2020 2210   LEUKOCYTESUR  NEGATIVE 10/04/2020 2210   Sepsis Labs: @LABRCNTIP (procalcitonin:4,lacticacidven:4)  ) Recent Results (from the past  240 hour(s))  Resp Panel by RT-PCR (Flu A&B, Covid) Nasopharyngeal Swab     Status: None   Collection Time: 09/29/20 11:02 AM   Specimen: Nasopharyngeal Swab; Nasopharyngeal(NP) swabs in vial transport medium  Result Value Ref Range Status   SARS Coronavirus 2 by RT PCR NEGATIVE NEGATIVE Final    Comment: (NOTE) SARS-CoV-2 target nucleic acids are NOT DETECTED.  The SARS-CoV-2 RNA is generally detectable in upper respiratory specimens during the acute phase of infection. The lowest concentration of SARS-CoV-2 viral copies this assay can detect is 138 copies/mL. A negative result does not preclude SARS-Cov-2 infection and should not be used as the sole basis for treatment or other patient management decisions. A negative result may occur with  improper specimen collection/handling, submission of specimen other than nasopharyngeal swab, presence of viral mutation(s) within the areas targeted by this assay, and inadequate number of viral copies(<138 copies/mL). A negative result must be combined with clinical observations, patient history, and epidemiological information. The expected result is Negative.  Fact Sheet for Patients:  BloggerCourse.comhttps://www.fda.gov/media/152166/download  Fact Sheet for Healthcare Providers:  SeriousBroker.ithttps://www.fda.gov/media/152162/download  This test is no t yet approved or cleared by the [place] FDA and  has been authorized for detection and/or diagnosis of SARS-CoV-2 by FDA under an Emergency Use Authorization (EUA). This EUA will remain  in effect (meaning this test can be used) for the duration of the COVID-19 declaration under Section 564(b)(1) of the Act, 21 U.S.C.section 360bbb-3(b)(1), unless the authorization is terminated  or revoked sooner.       Influenza A by PCR NEGATIVE NEGATIVE Final   Influenza B by PCR NEGATIVE NEGATIVE  Final    Comment: (NOTE) The Xpert Xpress SARS-CoV-2/FLU/RSV plus assay is intended as an aid in the diagnosis of influenza from Nasopharyngeal swab specimens and should not be used as a sole basis for treatment. Nasal washings and aspirates are unacceptable for Xpert Xpress SARS-CoV-2/FLU/RSV testing.  Fact Sheet for Patients: BloggerCourse.comhttps://www.fda.gov/media/152166/download  Fact Sheet for Healthcare Providers: SeriousBroker.ithttps://www.fda.gov/media/152162/download  This test is not yet approved or cleared by the [place] FDA and has been authorized for detection and/or diagnosis of SARS-CoV-2 by FDA under an Emergency Use Authorization (EUA). This EUA will remain in effect (meaning this test can be used) for the duration of the COVID-19 declaration under Section 564(b)(1) of the Act, 21 U.S.C. section 360bbb-3(b)(1), unless the authorization is terminated or revoked.  Performed at [facility], [address] [place], [place] [address]   MRSA PCR Screening     Status: None   Collection Time: 09/29/20  6:41 PM   Specimen: Nasopharyngeal  Result Value Ref Range Status   MRSA by PCR NEGATIVE NEGATIVE Final    Comment:        The GeneXpert MRSA Assay (FDA approved for NASAL specimens only), is one component of a comprehensive MRSA colonization surveillance program. It is not intended to diagnose MRSA infection nor to guide or monitor treatment for MRSA infections. Performed at [facility], [address] [place], [place] [address]   Body fluid culture     Status: None   Collection Time: 09/30/20 12:00 PM   Specimen: PATH Cytology Pleural fluid  Result Value Ref Range Status   Specimen Description   Final    PLEURAL Performed at [facility], [address] [place], [place] [address]    Special Requests   Final    NONE Performed at [facility], [address] [place], [place] [address]  Gram Stain NO WBC SEEN NO ORGANISMS SEEN    Final   Culture   Final    NO GROWTH 3 DAYS Performed at [facility], [address], [place], [place] [address]    Report Status 10/03/2020 FINAL  Final  Fungus Culture With Stain     Status: None (Preliminary result)   Collection Time: 09/30/20 12:00 PM   Specimen: PATH Cytology Pleural fluid  Result Value Ref Range Status   Fungus Stain Final report  Final    Comment: (NOTE) Performed At: [facility] [address] [place], [place] [id number] Jolene Schimke MD [id number]    Fungus (Mycology) Culture PENDING  Incomplete   Fungal Source PLEURAL  Final    Comment: Performed at [facility], [address] [place], [place] [address]  Acid Fast Smear (AFB)     Status: None   Collection Time: 09/30/20 12:00 PM   Specimen: PATH Cytology Pleural fluid  Result Value Ref Range Status   AFB Specimen Processing Concentration  Final   Acid Fast Smear Negative  Final    Comment: (NOTE) Performed At: [facility] [address] [place], [place] [id number] Jolene Schimke MD [id number]    Source (AFB) PLEURAL  Final    Comment: Performed at [facility], [address] [place], [place] [address]  Fungus Culture Result     Status: None   Collection Time: 09/30/20 12:00 PM  Result Value Ref Range Status   Result 1 Comment  Final    Comment: (NOTE) KOH/Calcofluor preparation:  no fungus observed. Performed At: [facility] [address] [place], [place] [id number] Jolene Schimke MD [id number]   Fungus Culture With Stain     Status: None (Preliminary result)   Collection Time: 10/03/20  3:40 PM   Specimen: PATH Cytology Pleural fluid  Result Value Ref Range Status   Fungus Stain Final report  Final    Comment: (NOTE) Performed At: [facility] [address] [place], [place] [address] Jolene Schimke MD HC:[id number]    Fungus (Mycology) Culture PENDING  Incomplete   Fungal Source PLEURAL  Final    Comment:  Performed at [facility], [address] [place], [place] [address]  Body fluid culture w Gram Stain     Status: None (Preliminary result)   Collection Time: 10/03/20  3:40 PM   Specimen: PATH Cytology Pleural fluid  Result Value Ref Range Status   Specimen Description   Final    PLEURAL Performed at [facility], [address] [place], [place] [address]    Special Requests   Final    PLEURAL Performed at [facility], [address] [place], [place] [address]    Gram Stain NO WBC SEEN NO ORGANISMS SEEN   Final   Culture   Final    NO GROWTH 1 DAY Performed at [facility], [address], [place], [place] [address]    Report Status PENDING  Incomplete  Fungus Culture Result     Status: None   Collection Time: 10/03/20  3:40 PM  Result Value Ref Range Status   Result 1 Comment  Final    Comment: (NOTE) KOH/Calcofluor preparation:  no fungus observed. Performed At: [facility] [address] [place], [place] [address] Jolene Schimke MD [id number]          Radiology Studies: [facility] Chest Port 1 View  Result Date: 10/04/2020 CLINICAL DATA:  Prior right thoracentesis. EXAM: PORTABLE CHEST  1 VIEW COMPARISON:  10/03/2020.  10/02/2020. FINDINGS: AICD noted stable position. Stable cardiomegaly. Bilateral interstitial infiltrates/edema cannot be excluded. Right pleural effusion is stable. Stable loculated left pleural effusion. Left base calcified pleural plaques again noted. No pneumothorax noted on today's exam. IMPRESSION: 1. Stable right pleural effusion. No pneumothorax noted on today's exam. Stable loculated left pleural effusion. 2. AICD noted stable position. Stable cardiomegaly. Bilateral interstitial edema/infiltrates cannot be excluded. Electronically Signed   By: Maisie Fus  Register   On: 10/04/2020 08:24   DG Chest Port 1 View  Result Date: 10/03/2020 CLINICAL DATA:  Status post thoracentesis. EXAM: PORTABLE CHEST 1 VIEW  COMPARISON:  Same day. FINDINGS: Stable cardiomediastinal silhouette. Right pleural effusion is significantly smaller status post thoracentesis. Possible small right apical pneumothorax is noted which most likely represents ex vacuo pneumothorax. Stable right lung opacity is noted. IMPRESSION: Right pleural effusion is significantly smaller status post thoracentesis. Possible small right apical pneumothorax is noted which most likely represents ex vacuo pneumothorax. Stable right lung opacity. Electronically Signed   By: Lupita Raider M.D.   On: 10/03/2020 16:10        Scheduled Meds: . allopurinol  100 mg Oral Daily  . apixaban  10 mg Oral BID   Followed by  . [START ON 10/07/2020] apixaban  5 mg Oral BID  . vitamin C  250 mg Oral BID  . atorvastatin  20 mg Oral Daily  . feeding supplement  237 mL Oral TID BM  . hydrocortisone sod succinate (SOLU-CORTEF) inj  50 mg Intravenous Q6H  . levothyroxine  25 mcg Oral Q0600  . midodrine  10 mg Oral QID  . mirtazapine  7.5 mg Oral QHS  . multivitamin with minerals  1 tablet Oral Daily  . sodium zirconium cyclosilicate  10 g Oral BID  . tolvaptan  15 mg Oral Once   Continuous Infusions: . banana bag IV 1000 mL 100 mL/hr at 10/05/20 1353     LOS: 5 days    Time spent: 35 minutes    Alberteen Sam, MD [facility] 10/05/2020, 3:40 PM     Please page though AMION or Epic secure chat:  For [employer], contact charge nurse    01/04/19: After initial ex-laparoscopy showed no perf: "Given the severe inflammatory changes without identified perforation, the decision was made to enter the lesser sac to examine the posterior surface of the stomach. The lesser sac was accessed via ... The greater curve of the stomach was retracted superiorly to examine the posterior surface of the stomach. Purulent and gastric material were encountered in the lesser sac and a small 61mm perforated ulcer was discovered on the posterior aspect of  the fundus of the stomach. 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 It was not felt that a graham patch would effectively reach this area of the stomach and there was concern about ischemia of this region. Therefore, stapled wedge resection was performed. The ulcerated and ischemic area of the fundus was elevated and wedge resected with purple endo GIA stapler loads with Seamguard with staple loads carefully placed on healthy viable gastric tissue. Total resected segment was ~3cm x 8cm."

## 2020-10-05 NOTE — Progress Notes (Signed)
Palliative: [name], Patrick Mccormick, is lying quietly in bed with his eyes closed.  His brow is furrowed.  He will briefly open his eyes, but not make eye contact.  Infrequently, he will nod affirmatively or negatively to my questions.  He is unable to make his basic needs known.  His daughter, Patrick Mccormick, is at bedside.  We talked about friend's acute and chronic health concerns.  She shares a story of decline over the last few years, last few months in particular.  She shares that Patrick Mccormick and his wife moved into the villages at [place] in December 2021.  Patrick Mccormick shares that she saw a marked decline in Patrick Mccormick during this time, she thinks because of the stress of moving.  She shares that he has always been a picky eater, but he has been eating less, considers this may be due to his choice of drinking wine over eating dinner.  We talked about the treatment plan in detail including, but not limited to, nephrology consult, IV fluids, medications, time for outcomes.  We talked about some "what if's and maybe's".  We also talked about how to make choices for loved ones including 1) keeping them at the center of decision-making 2) are we doing something for them or to them (can we change what is happening) and 3) the person Patrick Mccormick was 5 years ago, how would that man tell them to care for him now.  Patrick Mccormick is thoughtful and respectful of her father during our conversation.  We talked about meaningful improvements, quality of life.  Patrick Mccormick shares that quality is important to [place], his desire is to be home.  Patrick Mccormick asks about returning to the villages at [place], home health, versus rehab, versus hospice care.  Neck asks about "optimizing" Patrick Mccormick, and bringing him home.  We talked about time for outcomes,  We talked about CODE STATUS, "treat the treatable, but allow a natural passing".  Patrick Mccormick states that she will talk with her family about choices.  Conference with attending, nephrologist, bedside nursing staff, transition of  care team related to patient condition, needs, goals of care.  Plan:   At this point time for outcomes.  Continue to treat the treatable but no chest compressions, cardioversion/defibrillation, ACLS medications.  Agreeable to BiPAP/intubation - mechanical ventilation only. ACP updated.  65 minutes, extended time Lillia Carmel, NP Palliative medicine team Team phone [phone] Greater than 50% of this time was spent counseling and coordinating care related to the above assessment and plan.

## 2020-10-05 NOTE — Progress Notes (Signed)
Patient with good p.o. intake today, daughter at bedside for each meal to assist in redirection and feeding. No urine output noted for patient, bladder scan reveals 156 ml this evening.

## 2020-10-05 NOTE — Progress Notes (Signed)
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  Admits to wt loss>40lbs.  He further endorses new watery diarrhea, three times per day for one week. He denies blood. He endorses history of persistent right pleural effusion status post thoracentesis in which he had about two liters removed from only his right lungs about two years ago.  : He is married and lives with his spouse of 50 years. 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 He has never been a tobacco user. He drinks about two glasses of wine per night. PCCM consultation for pulmnary hypertension.     Patient had thoracentesis donex2 in 2020 and "was trying to control that",  He had one thoracentesis after admission here at [facility] and was able to get 300cc thoracentesis done but asked for procedure to be stopped half way throughout.    [name] is Patrick Mccormick is present today. She shares he is a chronic drinker of alcohol 1 day prior to admission he drank 3 glasses of wine.  He had TTE which was essentially normal.   -10/02/20-patient weaned to 2L/min [place].  He had episodes of hypotension despite midordrine.  He developed worsening aki overnight and renal consultation has been placed.  He has not diuresed well.  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  Vascular surgery has been  consulted.  Na levels are still low 120.  He remains without leukocytosis or fevers. Patient has been on lasix , he has IV fluids ordered today, I can dc lasix for now. Will repeat CXR today.  If patient is not surgical candidate he will need riociguat for CTEPH.   10/03/2020- patient is stable this am.  I called his son and reviewed hospital course and differential. He has significant aki and we reviewed case today.   10/04/2020- patient had repeat thoracentesis - removed 340cc from right pleural space. He is breathing better now, he was weaned down from 2L/min [place] to 1L/min [place].  He is oliguric with worsening AKI, however mentating well and is pleasant.  He is with severe senile hearing loss. Fluid with negative cytology for atypia 09/30/20 specimen.  Reviewed careplan with renal and primary team today.   10/05/2020- patient s/p cardiac and palliative evaluation.  He is unchanged from yesterday.  He is with overall poor prognosis, goals of care advanced to partial code status. Respiratory status remains unchanged overnight with spO2>90% on 1L/min [place].  Daughter present in room [facility] Patrick Mccormick) we disucused his hospital course and prognosis.  She shares family is leaning towards taking him home on comfort/hospice.   PAST MEDICAL HISTORY   History reviewed. No pertinent past medical history.   SURGICAL HISTORY   History reviewed. No pertinent surgical history.   FAMILY HISTORY  History reviewed. No pertinent family history.   SOCIAL HISTORY   Social History   Tobacco Use   Smoking status: Never Smoker   Smokeless tobacco: Never Used     MEDICATIONS    Home Medication:    Current Medication:  Current Facility-Administered Medications:    allopurinol (ZYLOPRIM) tablet 100 mg, 100 mg, Oral, Daily, Cox, Amy N, DO, 100 mg at 10/05/20 0941   apixaban (ELIQUIS) tablet 10 mg, 10 mg, Oral, BID, 10 mg at 10/05/20 0941 **FOLLOWED BY** [START ON 10/07/2020] apixaban (ELIQUIS)  tablet 5 mg, 5 mg, Oral, BID, [name], MD   ascorbic acid (VITAMIN C) tablet 250 mg, 250 mg, Oral, BID, [name], MD, 250 mg at 10/05/20 0941   atorvastatin (LIPITOR) tablet 20 mg, 20 mg, Oral, Daily, Cox, Amy N, DO, 20 mg at 10/05/20 0941   feeding supplement (ENSURE ENLIVE / ENSURE PLUS) liquid 237 mL, 237 mL, Oral, TID BM, [name], MD, 237 mL at 10/05/20 0940   [START ON 10/06/2020] hydrocortisone sodium succinate (SOLU-CORTEF) 100 MG injection 50 mg, 50 mg, Intravenous, Daily, [name], Earl Lites, MD   levothyroxine (SYNTHROID) tablet 25 mcg, 25 mcg, Oral, Q0600, [name], MD, 25 mcg at 10/05/20 0517   metoprolol tartrate (LOPRESSOR) injection 5 mg, 5 mg, Intravenous, Q4H PRN, Cox, Amy N, DO   midodrine (PROAMATINE) tablet 10 mg, 10 mg, Oral, QID, Manuela Schwartz, NP, 10 mg at 10/05/20 1409   mirtazapine (REMERON) tablet 7.5 mg, 7.5 mg, Oral, QHS, Cox, Amy N, DO, 7.5 mg at 10/04/20 2151   multivitamin with minerals tablet 1 tablet, 1 tablet, Oral, Daily, [name], MD, 1 tablet at 10/05/20 0941   ondansetron (ZOFRAN) tablet 4 mg, 4 mg, Oral, Q6H PRN **OR** ondansetron (ZOFRAN) injection 4 mg, 4 mg, Intravenous, Q6H PRN, Cox, Amy N, DO, 4 mg at 10/03/20 1633   sodium chloride (OCEAN) 0.65 % nasal spray 1 spray, 1 spray, Each Nare, PRN, [name], MD   sodium chloride 0.9 % 1,000 mL with thiamine 100 mg, folic acid 1 mg, multivitamins adult 10 mL infusion, , Intravenous, Continuous, Kolluru, Sarath, MD, Last Rate: 100 mL/hr at 10/05/20 1353, New Bag at 10/05/20 1353   sodium zirconium cyclosilicate (LOKELMA) packet 10 g, 10 g, Oral, BID, [name], MD, 10 g at 10/05/20 0943   tolvaptan (SAMSCA) tablet 15 mg, 15 mg, Oral, Once, Kolluru, Sarath, MD    ALLERGIES   Patient has no known allergies.     REVIEW OF SYSTEMS    Review of Systems:  Gen:  Denies  fever, sweats, chills weigh loss  HEENT: Denies blurred vision, double vision, ear  pain, eye pain, hearing loss, nose bleeds, sore throat Cardiac:  No dizziness, chest pain or heaviness, chest tightness,edema Resp:   Denies cough or sputum porduction, shortness of breath,wheezing, hemoptysis,  Gi: Denies swallowing difficulty, stomach pain, nausea or vomiting, diarrhea, constipation, bowel incontinence Gu:  Denies bladder incontinence, burning urine Ext:   Denies Joint pain, stiffness or swelling Skin: Denies  skin rash, easy bruising or bleeding or hives Endoc:  Denies polyuria, polydipsia , polyphagia or weight change Psych:   Denies depression, insomnia or hallucinations   Other:  All other systems negative   VS: BP 117/64 (BP Location: Left Arm)    Pulse 60    Temp 97.9 F (36.6 C)    Resp 15    Ht 5\' 7"  (1.702 m)    Wt 39.9 kg    SpO2 100%  BMI 13.78 kg/m      PHYSICAL EXAM    GENERAL:NAD, no fevers, chills, no weakness no fatigue HEAD: Normocephalic, atraumatic.  EYES: Pupils equal, round, reactive to light. Extraocular muscles intact. No scleral icterus.  MOUTH: Moist mucosal membrane. Dentition intact. No abscess noted.  EAR, NOSE, THROAT: Clear without exudates. No external lesions.  NECK: Supple. No thyromegaly. No nodules. No JVD.  PULMONARY: Decreased breath sounds bilaterally  CARDIOVASCULAR: S1 and S2. Regular rate and rhythm. No murmurs, rubs, or gallops. No edema. Pedal pulses 2+ bilaterally.  GASTROINTESTINAL: Soft, nontender, nondistended. No masses. Positive bowel sounds. No hepatosplenomegaly.  MUSCULOSKELETAL: No swelling, clubbing, or edema. Range of motion full in all extremities.  NEUROLOGIC: Cranial nerves II through XII are intact. No gross focal neurological deficits. Sensation intact. Reflexes intact.  SKIN: No ulceration, lesions, rashes, or cyanosis. Skin warm and dry. Turgor intact.  PSYCHIATRIC: Mood, affect within normal limits. The patient is awake, alert and oriented x 3. Insight, judgment intact.       IMAGING    DG  Chest 2 View  Result Date: 10/03/2020 CLINICAL DATA:  Acute renal injury EXAM: CHEST - 2 VIEW COMPARISON:  10/02/2020 FINDINGS: LEFT-sided pacemaker overlies stable cardiac silhouette. Large bilateral loculated pleural effusions. The LEFT effusion appears slightly decreased. No pneumothorax. Calcified pleural plaque over the LEFT hemidiaphragm. IMPRESSION: 1. Bilateral large loculated pleural effusions. 2. LEFT effusion may be slightly reduced. 3. No pulmonary edema or pneumothorax. Electronically Signed   By: Genevive Bi M.D.   On: 10/03/2020 10:23   CT Angio Chest PE W and/or Wo Contrast  Addendum Date: 09/29/2020   ADDENDUM REPORT: 09/29/2020 15:35 ADDENDUM: These results were called by telephone at the time of interpretation on 09/29/2020 at 3:35 pm to provider AMY COX , who verbally acknowledged these results. Electronically Signed   By: Kreg Shropshire M.D.   On: 09/29/2020 15:35   Result Date: 09/29/2020 CLINICAL DATA:  Dyspnea EXAM: CT ANGIOGRAPHY CHEST WITH CONTRAST TECHNIQUE: Multidetector CT imaging of the chest was performed using the standard protocol during bolus administration of intravenous contrast. Multiplanar CT image reconstructions and MIPs were obtained to evaluate the vascular anatomy. CONTRAST:  75mL OMNIPAQUE IOHEXOL 350 MG/ML SOLN COMPARISON:  Radiograph 09/29/2020 FINDINGS: Cardiovascular: Satisfactory opacification of pulmonary arteries. Some thin linear web-like bands of hypoattenuation in the distal left main pulmonary artery and lingular branches. Some more anti dependent hypoattenuation within the segmental pulmonary arteries of the left lower lobe have an appearance favored to be laminar flow and mixing artifact though a more subacute to remote pulmonary embolism is not fully excluded. Central pulmonary arteries are enlarged. There is marked cardiomegaly with four-chamber enlargement. Three-vessel coronary artery atherosclerosis is present. Left chest wall pacer pack is  noted with leads at the right atrium, coronary sinus and cardiac apex. Trace pericardial effusion. Suboptimal opacification of the aorta for luminal assessment. Atherosclerotic plaque within the normal caliber aorta. Normal 3 vessel branching of the aortic arch. Reflux of contrast in the hepatic veins and IVC. Mediastinum/Nodes: Diffuse edematous changes throughout the mediastinum. Scattered low-attenuation mediastinal and hilar lymph nodes favored to be reactive/edematous. No concerning axillary adenopathy. Fluid-filled, patulous thoracic esophagus. Trachea is unremarkable. No concerning thyroid abnormality though poorly evaluated with superimposed streak artifact. Lungs/Pleura: Complex, bilateral loculated pleural effusions which are predominantly low-attenuation. There are regions of dense basilar pleural calcification in both lungs. A more diffuse pleural thickening is noted in the right lung as well. There are areas of adjacent passive atelectasis with  additional more focal nodular atelectatic changes in the right lung (6/64). Could reflect some rounded atelectasis though underlying infection or disease is not fully excluded. Additional areas of bandlike reticular opacity with architectural distortion and scarring. Diffuse airways thickening and scattered secretions are noted as well. No pneumothorax. Upper Abdomen: Diffuse edematous changes noted throughout the upper abdomen and mesentery with small volume ascites. Extensive atherosclerotic plaque in the upper abdominal aorta. Calcified septation in the infrarenal abdominal aorta may reflect sequela of prior dissection or lamellar calcification of the aorta itself, incompletely assessed on this exam. Postsurgical changes from prior gastric surgery. Musculoskeletal: The osseous structures appear diffusely demineralized which may limit detection of small or nondisplaced fractures. No acute osseous abnormality or suspicious osseous lesion. Degenerative changes  are present in the imaged spine and shoulders. Diffuse body wall edema. Review of the MIP images confirms the above findings. IMPRESSION: 1. Web-like filling defects in the distal left main pulmonary artery and extending into the lingular branches may reflect subacute to chronic pulmonary embolism with acute pulmonary embolism less favored. More antidependent incomplete opacification of the left lower lobe pulmonary arteries could reflect laminar flow artifact though underlying embolism is not excluded. Evaluation for right heart strain is precluded in the setting of diffuse cardiomegaly and four-chamber cardiac enlargement though there is evidence of elevated right heart pressures and right-sided dysfunction with enlarged pulmonary arteries and refluxing contrast into the hepatic veins and IVC. Consider further characterization with echocardiography as clinically warranted. 2. Complex, bilateral loculated pleural effusions which are predominantly low-attenuation. Associated regions of dense basilar pleural calcification in both lungs. Findings can be on the spectrum of underlying asbestos related pleural disease though the sterility of these collections cannot be fully ascertained on an imaging basis. Extensive areas of passive atelectasis. More focal nodular atelectatic changes in the right lung as well. Could reflect some rounded atelectasis though underlying infection or disease is not fully excluded. Recommend attention on follow-up imaging. 3. Features of anasarca/volume overload with diffuse edematous changes throughout the mediastinum, mediastinum, upper abdomen, mesentery and body wall with small volume abdominal ascites. 4. Aortic Atherosclerosis (ICD10-I70.0). Currently attempting to contact the ordering provider with a critical value result. Addendum will be submitted upon case discussion. Electronically Signed: By: Kreg ShropshirePrice  DeHay M.D. On: 09/29/2020 15:28   CT ABDOMEN PELVIS W CONTRAST  Result Date:  09/30/2020 CLINICAL DATA:  Unintentional weight loss EXAM: CT ABDOMEN AND PELVIS WITH CONTRAST TECHNIQUE: Multidetector CT imaging of the abdomen and pelvis was performed using the standard protocol following bolus administration of intravenous contrast. CONTRAST:  60mL OMNIPAQUE IOHEXOL 300 MG/ML  SOLN COMPARISON:  None. FINDINGS: Lower chest: Complex moderate right pleural effusion is again identified with associated pleural thickening. Small focus of extrapleural gas within this collection likely relates to recent thoracentesis. There is associated compressive atelectasis of the right lung base. Partially loculated left pleural effusion largely within the left major fissure appears stable since prior CT examination of 09/29/2020. Dense pleural calcifications at the left lung base likely relate to remote trauma or inflammation. Mild global cardiomegaly with particular enlargement of the a right ventricle and right atrium are again identified. Pacemaker leads are seen within the right heart and left ventricular venous outflow. Extensive calcifications are noted within the coronary arteries. Hepatobiliary: Reflux of contrast into the hepatic venous system is in keeping with at least some degree of right heart failure. Tiny probable cyst within the inferior right hepatic lobe. Liver otherwise unremarkable. No intra or extrahepatic biliary ductal dilation.  Gallbladder unremarkable. Pancreas: Unremarkable Spleen: Unremarkable Adrenals/Urinary Tract: The adrenal glands are unremarkable. The kidneys are normal in position. There is mild-to-moderate bilateral renal cortical atrophy noted. Bilateral simple cortical cysts are identified. The kidneys are otherwise unremarkable. The bladder is unremarkable. Stomach/Bowel: Surgical changes of a partial gastrectomy are identified. Moderate sigmoid diverticulosis. The stomach, small bowel, and large bowel are otherwise unremarkable. No evidence of obstruction or focal  inflammation. Appendix normal. No free intraperitoneal gas. Mild ascites is present. Vascular/Lymphatic: There is extensive aortoiliac atherosclerotic calcification with particularly prominent atherosclerotic calcification at the origin of the a renal and mesenteric arterial vasculature almost certainly resulting in hemodynamically significant stenoses, not well characterized on this examination. Short segment focal dissection within the infrarenal abdominal aorta, likely the result of a remote penetrating atherosclerotic ulcer results in mild ectasia of the infrarenal abdominal aorta without frank aneurysm formation, with a maximal transaxial dimension of 2.6 cm. The common iliac arteries are ectatic bilaterally, left greater than right, without frank aneurysm formation. Extensive atherosclerotic calcification is noted within the lower extremity arterial inflow and visualized outflow. Reproductive: Prostate is unremarkable. Other: There is extensive subcutaneous edema noted throughout the body wall as well as retroperitoneal edema in keeping with changes of anasarca. Musculoskeletal: Degenerative changes are seen within the lumbar spine. No suspicious lytic or blastic bone lesions are seen. Degenerative changes are noted within the hips bilaterally. No acute bone abnormality. IMPRESSION: Complex right hydropneumothorax with small gaseous component likely related to recent thoracentesis. Cardiomegaly and extensive coronary artery calcification. Large mint of the right heart and reflux of contrast into the hepatic venous system in keeping with at least some degree of right heart failure. Dense calcification at the left lung base likely result of remote trauma or inflammation. Diffuse body wall subcutaneous edema, mild ascites, and retroperitoneal edema most in keeping with moderate anasarca, possibly the result of cardiogenic failure. Peripheral vascular disease with extensive atherosclerotic calcification at the  origin of the mesenteric and renal vasculature almost certainly resulting in hemodynamically significant stenosis. Clinical correlation for signs and symptoms of chronic mesenteric ischemia and/or significant renal artery stenosis is recommended. Aortic Atherosclerosis (ICD10-I70.0). Electronically Signed   By: Helyn Numbers MD   On: 09/30/2020 13:09   US RENAL  Result Date: 10/03/2020 CLINICAL DATA:  Acute kidney injury in a 87 year old male EXAM: RENAL / URINARY TRACT ULTRASOUND COMPLETE COMPARISON:  Abdomen and pelvis CT from 2022, February 2022 FINDINGS: Right Kidney: Renal measurements: 9.0 x 4.1 x 5.7 cm = volume: 110 mL. Slight increased echogenicity of renal cortex. Cyst in the interpolar aspect of the RIGHT kidney 2.5 x 2.4 x 1.4 cm. No hydronephrosis. Left Kidney: Renal measurements: 9.6 x 4.8 x 4.8 cm = volume: 114 mL. Mild increased echogenicity the without hydronephrosis or focal lesion aside from a small cyst in the lower pole. Bladder: Under distended urinary bladder with small volume fluid in the pelvis, small volume fluid in the pelvis seen on prior imaging Other: Small volume ascites in the pelvis as discussed. IMPRESSION: Mild increased cortical echogenicity as can be seen in the setting of medical renal disease. No hydronephrosis. Small volume pelvic ascites. Electronically Signed   By: Donzetta Kohut M.D.   On: 10/03/2020 10:52   DG Chest Port 1 View  Result Date: 10/04/2020 CLINICAL DATA:  Prior right thoracentesis. EXAM: PORTABLE CHEST 1 VIEW COMPARISON:  10/03/2020.  10/02/2020. FINDINGS: AICD noted stable position. Stable cardiomegaly. Bilateral interstitial infiltrates/edema cannot be excluded. Right pleural effusion is stable. Stable loculated  left pleural effusion. Left base calcified pleural plaques again noted. No pneumothorax noted on today's exam. IMPRESSION: 1. Stable right pleural effusion. No pneumothorax noted on today's exam. Stable loculated left pleural effusion. 2.  AICD noted stable position. Stable cardiomegaly. Bilateral interstitial edema/infiltrates cannot be excluded. Electronically Signed   By: Maisie Fus  Register   On: 10/04/2020 08:24   DG Chest Port 1 View  Result Date: 10/03/2020 CLINICAL DATA:  Status post thoracentesis. EXAM: PORTABLE CHEST 1 VIEW COMPARISON:  Same day. FINDINGS: Stable cardiomediastinal silhouette. Right pleural effusion is significantly smaller status post thoracentesis. Possible small right apical pneumothorax is noted which most likely represents ex vacuo pneumothorax. Stable right lung opacity is noted. IMPRESSION: Right pleural effusion is significantly smaller status post thoracentesis. Possible small right apical pneumothorax is noted which most likely represents ex vacuo pneumothorax. Stable right lung opacity. Electronically Signed   By: Lupita Raider M.D.   On: 10/03/2020 16:10   DG Chest Port 1 View  Result Date: 10/02/2020 CLINICAL DATA:  Fatigue EXAM: PORTABLE CHEST 1 VIEW COMPARISON:  09/30/2020 FINDINGS: There are persistent bilateral loculated pleural effusions which appear to have increased on the right but are stable on the left. Pleural base calcifications are again noted. There is no pneumothorax. The heart size is stable. Aortic calcifications are noted. There is a biventricular pacemaker in place. There is no acute osseous abnormality. IMPRESSION: Persistent bilateral loculated pleural effusions, increased on the right but stable on the left. Electronically Signed   By: Katherine Mantle M.D.   On: 10/02/2020 18:43   DG Chest Port 1 View  Result Date: 09/30/2020 CLINICAL DATA:  Status post right-sided thoracentesis. EXAM: PORTABLE CHEST 1 VIEW COMPARISON:  CT a chest in chest x-ray from yesterday. FINDINGS: Unchanged left chest wall pacemaker. Stable cardiomegaly and pulmonary artery enlargement. Loculated bilateral pleural effusions again noted, slightly decreased on the right status post thoracentesis. No  pneumothorax. Unchanged pleural calcification at the lung bases and bilateral lower lobe atelectasis. No acute osseous abnormality. IMPRESSION: 1. Loculated bilateral pleural effusions, slightly decreased on the right status post thoracentesis. No pneumothorax. Electronically Signed   By: Obie Dredge M.D.   On: 09/30/2020 12:34   DG Chest Portable 1 View  Result Date: 09/29/2020 CLINICAL DATA:  Dyspnea EXAM: PORTABLE CHEST 1 VIEW COMPARISON:  None. FINDINGS: Bilateral basilar laterally loculated pleural effusions are present, moderate on the right and small on the left. Left basilar pleural calcifications are identified. There is enlargement of the right hilum suggesting presence of right hilar mass or right hilar adenopathy. There is partial right lower lobe collapse. Opacity within the left perihilar region may represent fluid within the left major fissure. A left perihilar mass, however, is difficult to exclude. No pneumothorax. Mild cardiomegaly. Left subclavian 3 lead pacemaker in place. Vascular calcifications are seen within the abdominal aorta. The pulmonary vascularity is normal. No acute bone abnormality. IMPRESSION: Suspected right hilar mass or adenopathy with subtotal collapse of the right lower lobe possibly representing postobstructive collapse. Bilateral loculated pleural effusions, moderate on the right and small on the left. Coarse left basilar pleural calcification may relate to remote trauma or infection. Left perihilar opacity possibly representing fluid within the fissure or a left perihilar focal pulmonary mass. This would be better assessed with dedicated contrast enhanced CT imaging. Mild cardiomegaly. Electronically Signed   By: Helyn Numbers MD   On: 09/29/2020 11:08   ECHOCARDIOGRAM COMPLETE  Result Date: 09/30/2020    ECHOCARDIOGRAM REPORT  Patient Name:   NYEEM STOKE Date of Exam: 09/30/2020 Medical Rec #:  [id number]       Height:       67.0 in Accession #:     [id number]      Weight:       88.3 lb Date of Birth:  1932-06-20       BSA:          1.429 m Patient Age:    88 years        BP:           87/50 mmHg Patient Gender: M               HR:           76 bpm. Exam Location:  [facility] Procedure: 2D Echo, Color Doppler and Cardiac Doppler Indications:     R06.00 Dyspnea  History:         Patient has no prior history of Echocardiogram examinations.                  Arrythmias:Paroxymal atrial fibrillation,                  Signs/Symptoms:Shortness of Breath; Risk Factors:Hypertension.  Sonographer:     Humphrey Rolls RDCS (AE) Referring Phys:  [id number] AMY N COX Diagnosing Phys: Harold Hedge MD  Sonographer Comments: TDS due to bony thorax. IMPRESSIONS  1. Left ventricular ejection fraction, by estimation, is 65 to 70%. The left ventricle has normal function. The left ventricle has no regional wall motion abnormalities. Left ventricular diastolic parameters were normal.  2. Right ventricular systolic function is normal. The right ventricular size is mildly enlarged.  3. Left atrial size was mildly dilated.  4. Right atrial size was mildly dilated.  5. The mitral valve is grossly normal. Mild to moderate mitral valve regurgitation.  6. The aortic valve is calcified. Aortic valve regurgitation is trivial. Mild to moderate aortic valve sclerosis/calcification is present, without any evidence of aortic stenosis. FINDINGS  Left Ventricle: Left ventricular ejection fraction, by estimation, is 65 to 70%. The left ventricle has normal function. The left ventricle has no regional wall motion abnormalities. The left ventricular internal cavity size was normal in size. There is  borderline left ventricular hypertrophy. Left ventricular diastolic parameters were normal. Right Ventricle: The right ventricular size is mildly enlarged. No increase in right ventricular wall thickness. Right ventricular systolic function is normal. Left Atrium: Left atrial size was mildly dilated. Right Atrium:  Right atrial size was mildly dilated. Pericardium: There is no evidence of pericardial effusion. Mitral Valve: The mitral valve is grossly normal. Mild to moderate mitral valve regurgitation. MV peak gradient, 3.1 mmHg. The mean mitral valve gradient is 1.0 mmHg. Tricuspid Valve: The tricuspid valve is not well visualized. Tricuspid valve regurgitation is trivial. Aortic Valve: The aortic valve is calcified. Aortic valve regurgitation is trivial. Mild to moderate aortic valve sclerosis/calcification is present, without any evidence of aortic stenosis. Aortic valve mean gradient measures 3.0 mmHg. Aortic valve peak  gradient measures 5.9 mmHg. Aortic valve area, by VTI measures 1.71 cm. Pulmonic Valve: The pulmonic valve was not well visualized. Pulmonic valve regurgitation is trivial. Aorta: The aortic root is normal in size and structure. IAS/Shunts: No atrial level shunt detected by color flow Doppler. Additional Comments: A pacer wire is visualized.  LEFT VENTRICLE PLAX 2D LVIDd:         3.50 cm LVIDs:  2.10 cm LV PW:         1.10 cm LV IVS:        0.80 cm LVOT diam:     2.20 cm LV SV:         38 LV SV Index:   26 LVOT Area:     3.80 cm  RIGHT VENTRICLE RV Basal diam:  4.80 cm LEFT ATRIUM             Index       RIGHT ATRIUM           Index LA diam:        4.10 cm 2.87 cm/m  RA Area:     30.00 cm LA Vol (A2C):   40.4 ml 28.28 ml/m RA Volume:   112.00 ml 78.39 ml/m LA Vol (A4C):   92.3 ml 64.61 ml/m LA Biplane Vol: 65.3 ml 45.71 ml/m  AORTIC VALVE                   PULMONIC VALVE AV Area (Vmax):    1.97 cm    PV Vmax:       0.68 m/s AV Area (Vmean):   1.92 cm    PV Vmean:      42.900 cm/s AV Area (VTI):     1.71 cm    PV VTI:        0.128 m AV Vmax:           121.00 cm/s PV Peak grad:  1.9 mmHg AV Vmean:          79.000 cm/s PV Mean grad:  1.0 mmHg AV VTI:            0.220 m AV Peak Grad:      5.9 mmHg AV Mean Grad:      3.0 mmHg LVOT Vmax:         62.70 cm/s LVOT Vmean:        40.000 cm/s LVOT  VTI:          0.099 m LVOT/AV VTI ratio: 0.45  AORTA Ao Root diam: 3.50 cm MITRAL VALVE MV Area (PHT): 6.96 cm    SHUNTS MV Area VTI:   2.36 cm    Systemic VTI:  0.10 m MV Peak grad:  3.1 mmHg    Systemic Diam: 2.20 cm MV Mean grad:  1.0 mmHg MV Vmax:       0.88 m/s MV Vmean:      47.8 cm/s MV Decel Time: 109 msec MV E velocity: 75.80 cm/s MV A velocity: 36.80 cm/s MV E/A ratio:  2.06 Harold Hedge MD Electronically signed by Harold Hedge MD Signature Date/Time: 09/30/2020/11:36:34 AM    Final    US LIVER DOPPLER  Result Date: 10/02/2020 CLINICAL DATA:  Hepatic cirrhosis EXAM: DUPLEX ULTRASOUND OF LIVER TECHNIQUE: Color and duplex Doppler ultrasound was performed to evaluate the hepatic in-flow and out-flow vessels. COMPARISON:  09/30/2020 CT with contrast FINDINGS: Liver: Increased hepatic echogenicity. No definite contour abnormality or significant surface nodularity. No focal lesion, mass or intrahepatic biliary ductal dilatation. Main Portal Vein size: 0.9 cm Portal Vein Velocities Main Prox:  21 cm/sec Main Mid: 40 cm/sec Main Dist:  36 cm/sec Right: 32 cm/sec Left: 24 cm/sec Hepatic Vein Velocities Right:  51 cm/sec Middle:  58 cm/sec Left:  51 cm/sec IVC: Present and patent with normal respiratory phasicity. Hepatic Artery Velocity:  71 cm/sec Splenic Vein Velocity:  14 cm/sec Spleen: 4 cm x 4 cm x  6 cm with a total volume of 62 cm^3 (411 cm^3 is upper limit normal) Portal Vein Occlusion/Thrombus: No Splenic Vein Occlusion/Thrombus: No Ascites: None Varices: None IMPRESSION: Hepatic veins and IVC are dilated suggesting component of right heart failure. Portal, hepatic and splenic veins are all patent with normal directional flow. No veno-occlusive process. Electronically Signed   By: Judie Petit.  Shick M.D.   On: 10/02/2020 11:19   US THORACENTESIS ASP PLEURAL SPACE W/IMG GUIDE  Result Date: 10/04/2020 INDICATION: Patient with history of pulmonary hypertension and persistent AFib. Found to have a recurrent  right-sided pleural effusion. Team is requesting therapeutic and diagnostic thoracentesis EXAM: ULTRASOUND GUIDED THERAPEUTIC AND DIAGNOSTIC THORACENTESIS MEDICATIONS: Lidocaine 1% 10 mL COMPLICATIONS: None immediate. PROCEDURE: An ultrasound guided thoracentesis was thoroughly discussed with the patient and questions answered. The benefits, risks, alternatives and complications were also discussed. The patient understands and wishes to proceed with the procedure. Written consent was obtained. Ultrasound was performed to localize and mark an adequate pocket of fluid in the right chest. The area was then prepped and draped in the normal sterile fashion. 1% Lidocaine was used for local anesthesia. Under ultrasound guidance a 6 Fr Safe-T-Centesis catheter was introduced. Thoracentesis was performed. The catheter was removed and a dressing applied. FINDINGS: A total of approximately 350 mL of amber colored fluid was removed. Samples were sent to the laboratory as requested by the clinical team. IMPRESSION: Successful ultrasound guided therapeutic and diagnostic right sided thoracentesis yielding 350 mL of pleural fluid. Read by: Anders Grant, NP Electronically Signed   By: Acquanetta Belling M.D.   On: 10/03/2020 16:35   US THORACENTESIS ASP PLEURAL SPACE W/IMG GUIDE  Result Date: 09/30/2020 INDICATION: Patient with history of pulmonary hypertension and persistent AFib. Found to have bilateral pleural effusions after presenting to the emergency department with shortness of breath. Team is requesting therapeutic and diagnostic thoracentesis EXAM: ULTRASOUND GUIDED RIGHT-SIDED THERAPEUTIC AND DIAGNOSTIC THORACENTESIS MEDICATIONS: Lidocaine 1% 10 mL COMPLICATIONS: None immediate. PROCEDURE: An ultrasound guided thoracentesis was thoroughly discussed with the patient and questions answered. The benefits, risks, alternatives and complications were also discussed. The patient understands and wishes to proceed with the  procedure. Written consent was obtained. Ultrasound was performed to localize and mark an adequate pocket of fluid in the right chest. The area was then prepped and draped in the normal sterile fashion. 1% Lidocaine was used for local anesthesia. Under ultrasound guidance a 6 Fr Safe-T-Centesis catheter was introduced. Thoracentesis was performed. The catheter was removed and a dressing applied. FINDINGS: A total of approximately 350 mL of amber colored fluid was removed. Samples were sent to the laboratory as requested by the clinical team. Patient unable to tolerate additional fluid removal and per patient request the procedure was terminated. IMPRESSION: Successful ultrasound guided therapeutic and diagnostic right-sided thoracentesis yielding 350 mL of pleural fluid. Read by: Anders Grant, NP Electronically Signed   By: Simonne Come M.D.   On: 09/30/2020 12:54    ECHOCARDIOGRAM REPORT       Patient Name:  Patrick Mccormick Date of Exam: 09/30/2020  Medical Rec #: [id number]    Height:    67.0 in  Accession #:  [id number]   Weight:    88.3 lb  Date of Birth: [birth year]    BSA:     1.429 m  Patient Age:  88 years    BP:      87/50 mmHg  Patient Gender: M        HR:  76 bpm.  Exam Location: [facility]   Procedure: 2D Echo, Color Doppler and Cardiac Doppler   Indications:   R06.00 Dyspnea    History:     Patient has no prior history of Echocardiogram  examinations.          Arrythmias:Paroxymal atrial fibrillation,          Signs/Symptoms:Shortness of Breath; Risk  Factors:Hypertension.    Sonographer:   Humphrey Rolls RDCS (AE)  Referring Phys: [id number] AMY N COX  Diagnosing Phys: Harold Hedge MD     Sonographer Comments: TDS due to bony thorax.  IMPRESSIONS    1. Left ventricular ejection fraction, by estimation, is 65 to 70%. The  left ventricle has normal function. The left ventricle has no regional   wall motion abnormalities. Left ventricular diastolic parameters were  normal.  2. Right ventricular systolic function is normal. The right ventricular  size is mildly enlarged.  3. Left atrial size was mildly dilated.  4. Right atrial size was mildly dilated.  5. The mitral valve is grossly normal. Mild to moderate mitral valve  regurgitation.  6. The aortic valve is calcified. Aortic valve regurgitation is trivial.  Mild to moderate aortic valve sclerosis/calcification is present, without  any evidence of aortic stenosis.   FINDINGS  Left Ventricle: Left ventricular ejection fraction, by estimation, is 65  to 70%. The left ventricle has normal function. The left ventricle has no  regional wall motion abnormalities. The left ventricular internal cavity  size was normal in size. There is  borderline left ventricular hypertrophy. Left ventricular diastolic  parameters were normal.   Right Ventricle: The right ventricular size is mildly enlarged. No  increase in right ventricular wall thickness. Right ventricular systolic  function is normal.   Left Atrium: Left atrial size was mildly dilated.   Right Atrium: Right atrial size was mildly dilated.   Pericardium: There is no evidence of pericardial effusion.   Mitral Valve: The mitral valve is grossly normal. Mild to moderate mitral  valve regurgitation. MV peak gradient, 3.1 mmHg. The mean mitral valve  gradient is 1.0 mmHg.   Tricuspid Valve: The tricuspid valve is not well visualized. Tricuspid  valve regurgitation is trivial.   Aortic Valve: The aortic valve is calcified. Aortic valve regurgitation is  trivial. Mild to moderate aortic valve sclerosis/calcification is present,  without any evidence of aortic stenosis. Aortic valve mean gradient  measures 3.0 mmHg. Aortic valve peak  gradient measures 5.9 mmHg. Aortic valve area, by VTI measures 1.71 cm.   Pulmonic Valve: The pulmonic valve was not well visualized.  Pulmonic valve  regurgitation is trivial.   Aorta: The aortic root is normal in size and structure.   IAS/Shunts: No atrial level shunt detected by color flow Doppler.   Additional Comments: A pacer wire is visualized.     LEFT VENTRICLE  PLAX 2D  LVIDd:     3.50 cm  LVIDs:     2.10 cm  LV PW:     1.10 cm  LV IVS:    0.80 cm  LVOT diam:   2.20 cm  LV SV:     38  LV SV Index:  26  LVOT Area:   3.80 cm     RIGHT VENTRICLE  RV Basal diam: 4.80 cm   LEFT ATRIUM       Index    RIGHT ATRIUM      Index  LA diam:    4.10 cm 2.87 cm/m RA Area:  30.00 cm  LA Vol (A2C):  40.4 ml 28.28 ml/m RA Volume:  112.00 ml 78.39 ml/m  LA Vol (A4C):  92.3 ml 64.61 ml/m  LA Biplane Vol: 65.3 ml 45.71 ml/m  AORTIC VALVE          PULMONIC VALVE  AV Area (Vmax):  1.97 cm  PV Vmax:    0.68 m/s  AV Area (Vmean):  1.92 cm  PV Vmean:   42.900 cm/s  AV Area (VTI):   1.71 cm  PV VTI:    0.128 m  AV Vmax:      121.00 cm/s PV Peak grad: 1.9 mmHg  AV Vmean:     79.000 cm/s PV Mean grad: 1.0 mmHg  AV VTI:      0.220 m  AV Peak Grad:   5.9 mmHg  AV Mean Grad:   3.0 mmHg  LVOT Vmax:     62.70 cm/s  LVOT Vmean:    40.000 cm/s  LVOT VTI:     0.099 m  LVOT/AV VTI ratio: 0.45    AORTA  Ao Root diam: 3.50 cm   MITRAL VALVE  MV Area (PHT): 6.96 cm  SHUNTS  MV Area VTI:  2.36 cm  Systemic VTI: 0.10 m  MV Peak grad: 3.1 mmHg  Systemic Diam: 2.20 cm  MV Mean grad: 1.0 mmHg  MV Vmax:    0.88 m/s  MV Vmean:   47.8 cm/s  MV Decel Time: 109 msec  MV E velocity: 75.80 cm/s  MV A velocity: 36.80 cm/s  MV E/A ratio: 2.06   Harold Hedge MD  Electronically signed by Harold Hedge MD  Signature Date/Time: 09/30/2020/11:36:34 AM       ASSESSMENT/PLAN    Chronic pulmonary effusions -Patient is not in heart failure per most recent TTE but he does have chronic  cor pulmonale and anasarca per CT -US liver - RV failure -he has mild aki today buy in general his GFR is within reference range when corrected for age.  -Nephrology on case appreciate input - midodrine , lasix fluid balance -thus far pleural fluid showing borderline exudate lymphocyte predominant which likely reflects chronic effusion.  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    Patient/Family are satisfied with care plan and all questions have been answered.  This document was prepared using Dragon voice recognition software and may include unintentional dictation errors.     Vida Rigger, M.D.  Division of Pulmonary & Critical Care Medicine  [facility]

## 2020-10-05 NOTE — Plan of Care (Signed)
Urine studies sent earlier at the beginning of the shift. Pt has not responded to Lasix drip. He voided once last evening and it was 50 ml enough to send for urine studies. Bladder scan done at 0300am and 97ml noted. Pt able to answer orientation questions. Very calm and cooperative. But pt acting confused. 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 Pt restless and fidgety... Pt on Eliquis and bruising starting to appear to BUE. At IV sites, bleeding noted. NP Jon Billings and MD notified. Problem: Education: Goal: Knowledge of General Education information will improve Description: Including pain rating scale, medication(s)/side effects and non-pharmacologic comfort measures Outcome: Progressing   Problem: Health Behavior/Discharge Planning: Goal: Ability to manage health-related needs will improve Outcome: Progressing   Problem: Clinical Measurements: Goal: Ability to maintain clinical measurements within normal limits will improve Outcome: Progressing Goal: Will remain free from infection Outcome: Progressing Goal: Diagnostic test results will improve Outcome: Progressing Goal: Respiratory complications will improve Outcome: Progressing Goal: Cardiovascular complication will be avoided Outcome: Progressing   Problem: Activity: Goal: Risk for activity intolerance will decrease Outcome: Progressing   Problem: Nutrition: Goal: Adequate nutrition will be maintained Outcome: Progressing   Problem: Coping: Goal: Level of anxiety will decrease Outcome: Progressing   Problem: Elimination: Goal: Will not experience complications related to bowel motility Outcome: Progressing Goal: Will not experience complications related to urinary retention Outcome: Progressing   Problem: Pain Managment: Goal: General experience of comfort will improve Outcome: Progressing   Problem: Safety: Goal: Ability to remain free from injury  will improve Outcome: Progressing   Problem: Skin Integrity: Goal: Risk for impaired skin integrity will decrease Outcome: Progressing

## 2020-10-05 NOTE — Progress Notes (Signed)
[facility] [place], [place] 10/05/20  Subjective:   Patrick Mccormick is a 87 y.o. male with PMHX of pulmonary HTN, HTN, hx of SIADH, persistent atrial fibrillation, iron deficiency anemia and gastrectomy. He presented to the ED with complaints of shortness of breath.   He was admitted with respiratory failure including pleural effusion vs pulmonary embolism vs heart failure exacerbation.   Patient seen this morning with daughter at the bedside He is currently being feed breakfast He is alert, but has moments of confusion Denies shortness of breath and chest pain  Lasix Drip @4  mg/hr  Objective:  Vital signs in last 24 hours:  Temp:  [97.4 F (36.3 C)-97.9 F (36.6 C)] 97.9 F (36.6 C) (02/16 1140) Pulse Rate:  [59-61] 60 (02/16 1140) Resp:  [15-18] 15 (02/16 1140) BP: (109-125)/(64-78) 117/64 (02/16 1140) SpO2:  [93 %-100 %] 100 % (02/16 1140) Weight:  [39.9 kg] 39.9 kg (02/16 0405)  Weight change: 0.528 kg Filed Weights   10/03/20 0501 10/04/20 0358 10/05/20 0405  Weight: 39.1 kg 39.4 kg 39.9 kg    Intake/Output:    Intake/Output Summary (Last 24 hours) at 10/05/2020 1427 Last data filed at 10/04/2020 2100 Gross per 24 hour  Intake --  Output 150 ml  Net -150 ml   Physical Exam: General: Cachectic. Laying in bed  Head: Normocephalic, atraumatic. Dry oral mucosal membranes  Eyes: Anicteric  Neck: Supple, trachea midline  Lungs:  Bilateral rhonchi  On 2 L [place] O2  Heart: Regular rate and rhythm  Abdomen:  Soft, nontender,   Extremities: +1 peripheral edema.  Neurologic: Nonfocal, moving all four extremities  Skin: No lesions    Basic Metabolic Panel:  Recent Labs  Lab 10/01/20 1645 10/02/20 0444 10/03/20 0409 10/04/20 0502 10/04/20 1353 10/05/20 0651  NA 121* 120* 120* 123*  --  123*  K 4.5 4.7 5.2* 5.6* 5.3* 5.6*  CL 90* 89* 89* 88*  --  87*  CO2 23 24 22 25   --  23  GLUCOSE 94 148* 133* 122*  --  140*  BUN 46* 51* 70* 87*  --   101*  CREATININE 1.81* 2.28* 3.02* 3.77*  --  4.44*  CALCIUM 9.0 9.1 9.5 10.2  --  10.3     CBC: Recent Labs  Lab 09/29/20 1102 09/30/20 0432 10/01/20 0432 10/02/20 0444 10/03/20 0409  WBC 4.0 3.8* 4.7 5.2 7.7  NEUTROABS 2.8  --   --   --   --   HGB 11.4* 10.2* 9.8* 9.0* 9.6*  HCT 33.1* 28.4* 28.8* 25.9* 27.4*  MCV 93.8 91.0 94.4 94.9 94.5  PLT 157 128* 130* 134* 148*     No results found for: HEPBSAG, HEPBSAB, HEPBIGM    Microbiology:  Recent Results (from the past 240 hour(s))  Resp Panel by RT-PCR (Flu A&B, Covid) Nasopharyngeal Swab     Status: None   Collection Time: 09/29/20 11:02 AM   Specimen: Nasopharyngeal Swab; Nasopharyngeal(NP) swabs in vial transport medium  Result Value Ref Range Status   SARS Coronavirus 2 by RT PCR NEGATIVE NEGATIVE Final    Comment: (NOTE) SARS-CoV-2 target nucleic acids are NOT DETECTED.  The SARS-CoV-2 RNA is generally detectable in upper respiratory specimens during the acute phase of infection. The lowest concentration of SARS-CoV-2 viral copies this assay can detect is 138 copies/mL. A negative result does not preclude SARS-Cov-2 infection and should not be used as the sole basis for treatment or other patient management decisions. A negative result may  occur with  improper specimen collection/handling, submission of specimen other than nasopharyngeal swab, presence of viral mutation(s) within the areas targeted by this assay, and inadequate number of viral copies(<138 copies/mL). A negative result must be combined with clinical observations, patient history, and epidemiological information. The expected result is Negative.  Fact Sheet for Patients:  BloggerCourse.comhttps://www.fda.gov/media/152166/download  Fact Sheet for Healthcare Providers:  SeriousBroker.ithttps://www.fda.gov/media/152162/download  This test is no t yet approved or cleared by the [place] FDA and  has been authorized for detection and/or diagnosis of SARS-CoV-2 by FDA under  an Emergency Use Authorization (EUA). This EUA will remain  in effect (meaning this test can be used) for the duration of the COVID-19 declaration under Section 564(b)(1) of the Act, 21 U.S.C.section 360bbb-3(b)(1), unless the authorization is terminated  or revoked sooner.       Influenza A by PCR NEGATIVE NEGATIVE Final   Influenza B by PCR NEGATIVE NEGATIVE Final    Comment: (NOTE) The Xpert Xpress SARS-CoV-2/FLU/RSV plus assay is intended as an aid in the diagnosis of influenza from Nasopharyngeal swab specimens and should not be used as a sole basis for treatment. Nasal washings and aspirates are unacceptable for Xpert Xpress SARS-CoV-2/FLU/RSV testing.  Fact Sheet for Patients: BloggerCourse.comhttps://www.fda.gov/media/152166/download  Fact Sheet for Healthcare Providers: SeriousBroker.ithttps://www.fda.gov/media/152162/download  This test is not yet approved or cleared by the [place] FDA and has been authorized for detection and/or diagnosis of SARS-CoV-2 by FDA under an Emergency Use Authorization (EUA). This EUA will remain in effect (meaning this test can be used) for the duration of the COVID-19 declaration under Section 564(b)(1) of the Act, 21 U.S.C. section 360bbb-3(b)(1), unless the authorization is terminated or revoked.  Performed at [facility], [address] [place], [place] [address]   MRSA PCR Screening     Status: None   Collection Time: 09/29/20  6:41 PM   Specimen: Nasopharyngeal  Result Value Ref Range Status   MRSA by PCR NEGATIVE NEGATIVE Final    Comment:        The GeneXpert MRSA Assay (FDA approved for NASAL specimens only), is one component of a comprehensive MRSA colonization surveillance program. It is not intended to diagnose MRSA infection nor to guide or monitor treatment for MRSA infections. Performed at [facility], [address] [place], [place] [address]   Body fluid culture     Status: None   Collection Time: 09/30/20 12:00  PM   Specimen: PATH Cytology Pleural fluid  Result Value Ref Range Status   Specimen Description   Final    PLEURAL Performed at [facility], [address] [place], [place] [address]    Special Requests   Final    NONE Performed at [facility], [address] [place], [place] [address]    Gram Stain NO WBC SEEN NO ORGANISMS SEEN   Final   Culture   Final    NO GROWTH 3 DAYS Performed at [facility], [address] [place], [place] [address]    Report Status 10/03/2020 FINAL  Final  Fungus Culture With Stain     Status: None (Preliminary result)   Collection Time: 09/30/20 12:00 PM   Specimen: PATH Cytology Pleural fluid  Result Value Ref Range Status   Fungus Stain Final report  Final    Comment: (NOTE) Performed At: [facility] [address] [place], [place] [id number] Jolene SchimkeNagendra Sanjai MD ON:[phone]    Fungus (Mycology) Culture PENDING  Incomplete   Fungal Source  PLEURAL  Final    Comment: Performed at [facility], [address] [place], [place] [address]  Acid Fast Smear (AFB)     Status: None   Collection Time: 09/30/20 12:00 PM   Specimen: PATH Cytology Pleural fluid  Result Value Ref Range Status   AFB Specimen Processing Concentration  Final   Acid Fast Smear Negative  Final    Comment: (NOTE) Performed At: [facility] [address] [place], [place] [address] Jolene Schimke MD MO:[id number]    Source (AFB) PLEURAL  Final    Comment: Performed at [facility], [address] [place], [place] [address]  Fungus Culture Result     Status: None   Collection Time: 09/30/20 12:00 PM  Result Value Ref Range Status   Result 1 Comment  Final    Comment: (NOTE) KOH/Calcofluor preparation:  no fungus observed. Performed At: [facility] [address] [place], [place] [id number] Jolene Schimke MD TG:[id number]   Fungus Culture With Stain     Status: None (Preliminary result)    Collection Time: 10/03/20  3:40 PM   Specimen: PATH Cytology Pleural fluid  Result Value Ref Range Status   Fungus Stain Final report  Final    Comment: (NOTE) Performed At: [facility] [address] [place], [place] [address] Jolene Schimke MD SU:[id number]    Fungus (Mycology) Culture PENDING  Incomplete   Fungal Source PLEURAL  Final    Comment: Performed at [facility], [address] [place], [place] [address]  Body fluid culture w Gram Stain     Status: None (Preliminary result)   Collection Time: 10/03/20  3:40 PM   Specimen: PATH Cytology Pleural fluid  Result Value Ref Range Status   Specimen Description   Final    PLEURAL Performed at [facility], [address], [place], [place] [address]    Special Requests   Final    PLEURAL Performed at [facility], [address] [place], [place] [address]    Gram Stain NO WBC SEEN NO ORGANISMS SEEN   Final   Culture   Final    NO GROWTH 1 DAY Performed at [facility], [address], [place], [place] [address]    Report Status PENDING  Incomplete  Fungus Culture Result     Status: None   Collection Time: 10/03/20  3:40 PM  Result Value Ref Range Status   Result 1 Comment  Final    Comment: (NOTE) KOH/Calcofluor preparation:  no fungus observed. Performed At: [facility] [address] [place], [place] [id number] Jolene Schimke MD VA:[id number]     Coagulation Studies: No results for input(s): LABPROT, INR in the last 72 hours.  Urinalysis: Recent Labs    10/04/20 2210  COLORURINE AMBER*  LABSPEC 1.024  PHURINE 5.0  GLUCOSEU NEGATIVE  HGBUR NEGATIVE  BILIRUBINUR NEGATIVE  KETONESUR NEGATIVE  PROTEINUR 30*  NITRITE NEGATIVE  LEUKOCYTESUR NEGATIVE      Imaging: DG Chest Port 1 View  Result Date: 10/04/2020 CLINICAL DATA:  Prior right thoracentesis. EXAM: PORTABLE CHEST 1 VIEW COMPARISON:  10/03/2020.  10/02/2020. FINDINGS: AICD noted stable  position. Stable cardiomegaly. Bilateral interstitial infiltrates/edema cannot be excluded. Right pleural effusion is stable. Stable loculated left pleural effusion. Left base calcified pleural plaques again noted. No pneumothorax noted on today's exam. IMPRESSION: 1. Stable right pleural effusion. No pneumothorax noted on today's exam. Stable loculated left pleural effusion. 2. AICD noted stable position. Stable cardiomegaly. Bilateral interstitial  edema/infiltrates cannot be excluded. Electronically Signed   By: Maisie Fus  Register   On: 10/04/2020 08:24   DG Chest Port 1 View  Result Date: 10/03/2020 CLINICAL DATA:  Status post thoracentesis. EXAM: PORTABLE CHEST 1 VIEW COMPARISON:  Same day. FINDINGS: Stable cardiomediastinal silhouette. Right pleural effusion is significantly smaller status post thoracentesis. Possible small right apical pneumothorax is noted which most likely represents ex vacuo pneumothorax. Stable right lung opacity is noted. IMPRESSION: Right pleural effusion is significantly smaller status post thoracentesis. Possible small right apical pneumothorax is noted which most likely represents ex vacuo pneumothorax. Stable right lung opacity. Electronically Signed   By: Lupita Raider M.D.   On: 10/03/2020 16:10   US THORACENTESIS ASP PLEURAL SPACE W/IMG GUIDE  Result Date: 10/04/2020 INDICATION: Patient with history of pulmonary hypertension and persistent AFib. Found to have a recurrent right-sided pleural effusion. Team is requesting therapeutic and diagnostic thoracentesis EXAM: ULTRASOUND GUIDED THERAPEUTIC AND DIAGNOSTIC THORACENTESIS MEDICATIONS: Lidocaine 1% 10 mL COMPLICATIONS: None immediate. PROCEDURE: An ultrasound guided thoracentesis was thoroughly discussed with the patient and questions answered. The benefits, risks, alternatives and complications were also discussed. The patient understands and wishes to proceed with the procedure. Written consent was obtained. Ultrasound  was performed to localize and mark an adequate pocket of fluid in the right chest. The area was then prepped and draped in the normal sterile fashion. 1% Lidocaine was used for local anesthesia. Under ultrasound guidance a 6 Fr Safe-T-Centesis catheter was introduced. Thoracentesis was performed. The catheter was removed and a dressing applied. FINDINGS: A total of approximately 350 mL of amber colored fluid was removed. Samples were sent to the laboratory as requested by the clinical team. IMPRESSION: Successful ultrasound guided therapeutic and diagnostic right sided thoracentesis yielding 350 mL of pleural fluid. Read by: Anders Grant, NP Electronically Signed   By: Acquanetta Belling M.D.   On: 10/03/2020 16:35     Medications:   . furosemide (LASIX) 200 mg in dextrose 5% 100 mL ( /mL) infusion 4 mg/hr (10/04/20 1759)  . banana bag IV 1000 mL 100 mL/hr at 10/05/20 1353   . allopurinol  100 mg Oral Daily  . apixaban  10 mg Oral BID   Followed by  . [START ON 10/07/2020] apixaban  5 mg Oral BID  . vitamin C  250 mg Oral BID  . atorvastatin  20 mg Oral Daily  . feeding supplement  237 mL Oral TID BM  . hydrocortisone sod succinate (SOLU-CORTEF) inj  50 mg Intravenous Q6H  . levothyroxine  25 mcg Oral Q0600  . midodrine  10 mg Oral QID  . mirtazapine  7.5 mg Oral QHS  . multivitamin with minerals  1 tablet Oral Daily  . sodium zirconium cyclosilicate  10 g Oral BID   metoprolol tartrate, ondansetron **OR** ondansetron (ZOFRAN) IV, sodium chloride  Assessment/ Plan:  87 y.o. male with PMHX of pulmonary HTN, HTN, hx of SIADH, persistent atrial fibrillation, iron deficiency anemia and gastrectomy. was admitted on 09/29/2020   Principal Problem:   Respiratory distress Active Problems:   Pleural effusion due to congestive heart failure (HCC)   Pulmonary hypertension (HCC)   Atrial fibrillation, chronic (HCC)   History of partial gastrectomy   Protein-calorie malnutrition,  severe  Shortness of breath [R06.02] Hyponatremia [E87.1] Pleural effusion [J90] Pleural effusion due to congestive heart failure (HCC) [I50.9] Acute respiratory failure with hypoxia (HCC) [J96.01]  #. Hyponatremia -Baseline appears to be 126-135 -Sodium at baseline -positive history of SIADH and  hyponatremia -started Lasix drip yesterday - Lokelma increased to BID -IV hydration with Thiamine, folic acid and multivitamins ordered @ 100 ml/hr to replenish vitamin depletion  #. Anemia of CKD  Lab Results  Component Value Date   HGB 9.6 (L) 10/03/2020    Will monitor for continued improvement  #. Acute kidney injury on CKD stage 3B -baseline creatinine 1.48 on 06/22/20. -No obstruction on CT abd/pelvis -Current level -4.44 -expected increase with lasix drip Will monitor labs closely  # Hypotension Midodrine started on 10/01/20 BP stable 100s/60s   LOS: 5 Wendee Beavers, NP 2/16/20222:27 PM  [facility] [place], [place] [phone]

## 2020-10-06 LAB — BASIC METABOLIC PANEL
Anion gap: 10 (ref 5–15)
BUN: 121 mg/dL — ABNORMAL HIGH (ref 8–23)
CO2: 24 mmol/L (ref 22–32)
Calcium: 10.1 mg/dL (ref 8.9–10.3)
Chloride: 87 mmol/L — ABNORMAL LOW (ref 98–111)
Creatinine, Ser: 4.79 mg/dL — ABNORMAL HIGH (ref 0.61–1.24)
GFR, Estimated: 11 mL/min — ABNORMAL LOW (ref >60)
Glucose, Bld: 128 mg/dL — ABNORMAL HIGH (ref 70–99)
Potassium: 4.8 mmol/L (ref 3.5–5.1)
Sodium: 121 mmol/L — ABNORMAL LOW (ref 135–145)

## 2020-10-06 LAB — SODIUM: Sodium: 123 mmol/L — ABNORMAL LOW (ref 135–145)

## 2020-10-06 LAB — PROTEIN ELECTRO, RANDOM URINE
Albumin ELP, Urine: 47.8 %
Alpha-1-Globulin, U: 6.3 %
Alpha-2-Globulin, U: 11.1 %
Beta Globulin, U: 21.9 %
Gamma Globulin, U: 12.9 %
Total Protein, Urine: 44.3 mg/dL

## 2020-10-06 LAB — CBC
HCT: 30.1 % — ABNORMAL LOW (ref 39.0–52.0)
Hemoglobin: 10.5 g/dL — ABNORMAL LOW (ref 13.0–17.0)
MCH: 33.2 pg (ref 26.0–34.0)
MCHC: 34.9 g/dL (ref 30.0–36.0)
MCV: 95.3 fL (ref 80.0–100.0)
Platelets: 204 10*3/uL (ref 150–400)
RBC: 3.16 MIL/uL — ABNORMAL LOW (ref 4.22–5.81)
RDW: 14.7 % (ref 11.5–15.5)
WBC: 8 10*3/uL (ref 4.0–10.5)
nRBC: 0 % (ref 0.0–0.2)

## 2020-10-06 LAB — KAPPA/LAMBDA LIGHT CHAINS
Kappa free light chain: 123.3 mg/L — ABNORMAL HIGH (ref 3.3–19.4)
Kappa, lambda light chain ratio: 1.93 — ABNORMAL HIGH (ref 0.26–1.65)
Lambda free light chains: 63.9 mg/L — ABNORMAL HIGH (ref 5.7–26.3)

## 2020-10-06 LAB — AMMONIA: Ammonia: 13 umol/L (ref 9–35)

## 2020-10-06 LAB — PH, BODY FLUID: pH, Body Fluid: 7.6

## 2020-10-06 MED ORDER — MORPHINE SULFATE (CONCENTRATE) 10 MG/0.5ML PO SOLN: 5.0000 mg | ORAL | Status: DC | PRN

## 2020-10-06 MED ORDER — MIDODRINE HCL 5 MG PO TABS
5.0000 mg | ORAL_TABLET | Freq: Three times a day (TID) | ORAL | Status: DC
  Administered 2020-10-06: 5 mg via ORAL
  Filled 2020-10-06: qty 1

## 2020-10-06 MED ORDER — TOLVAPTAN 15 MG PO TABS
15.0000 mg | ORAL_TABLET | Freq: Every day | ORAL | Status: DC
  Administered 2020-10-06: 15 mg via ORAL
  Filled 2020-10-06: qty 1

## 2020-10-06 NOTE — Progress Notes (Signed)
[facility] Room 242 [facility]) Hospital Liaison RN note:  Received request from Morton Stall, NP for family interest in hospice services at home after discharge. Spoke with daughter, [name] over the phone. We discussed hospice philosophy and services offered. She verbalized understanding and all questioned were answered. She requests to have tonight to discuss this with family and will have a decision in the morning. ACC RN will follow up with daughter in the am and order any equipment that may be needed.  Thank you for the opportunity to participate in this patient's care.  Cyndra Numbers, RN [facility] Liaison [phone]

## 2020-10-06 NOTE — Progress Notes (Signed)
[facility] PROGRESS NOTE    Patrick Mccormick  HYI:[id number] DOB: June 18, 1932 DOA: 09/29/2020 PCP: Conan Bowens., MD      Brief Narrative:  Patrick Mccormick is a 87 y.o. M with PH on sildenafil with [facility], A. 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  In the ER, he was hypoxic to the 80s.  CTA showed PE and so he was started on heparin.          Assessment & Plan:  Lactic respiratory failure in the setting of worsening pleural effusion, chronic PE, and known pulmonary hypertension with right heart failure Patient was admitted, underwent thoracentesis x2.  Transudate of fluid, 700 cc total, some residual pleural effusions on imaging  Stable on 2L     Weight loss Severe protein calorie malnutrition This actually appears to be his chief life-threatening problem.  Appears to have begun over the last 12-16 months, as the timeline below shows, and family have noted to multiple providers that they perceived it to begin after his gastrectomy for perforated gastric ulcer in 2020:  4/19 - 71kg 11/19 - 75 kg 1/20 - 69 kg 4/20 - 67 kg 01/04/19 - Gastrectomy (details from this Op Note are at the bottom of this note) 10/20 - 74 kg 1/21 - 70 kg 2/21 - 65 kg 3/21 - 61 kg 4/21 - 54 kg 6/21 - 53 kg 9/21 - 48 kg 11/21 - 47 kg Now - 40 kg  During this time, the patient has had poor PO intake, mostly due to reduced appetite.  He has had no prominent diarrhea, no hematochezia or mucus in the stool although I can find no history of colonoscopy, son states his last colonoscopy was a few years ago.    No vomiting, no bloating or indigestion.  By last August, he was down to 48 kg, and J tube was discussed, but this referral was  not made as the patient told daughter "if I get a feeding tube, it means I'm admitting that I'm dying."  At this time, he is down to 39.9 kg, BMI 13.  Copper and B12 levels normal, Nephrology note, serum copper can be falsely elevated in liver disease.  B12 replete.  Vit D and A and C pending.     Acute renal failure on CKD IIIa See summary from 2/16  Cr marching up to 4.79 today. SPEP pending, but FLCs nondiagnostic in this case.  Even if MM were the cause, I do not believe chemotherapy would be consistent with the patient's goals of care.  No UOP documented.  Nursing report none.      Dr. Wynelle Link and I had a family meeting today with family.  We discussed options for aggressive treatment including initiation of dialysis encephalopathy, placement of a feeding tube for artificial nutrition.  This course of action would require a prolonged hospitalization, and the likelihood the patient would regain kidney function, have improved mentation enough to enjoy life, and be able to tolerate dialysis are unclear.  Furthermore it would require placement of a long-term feeding tube, which the patient had already chosen NOT to do after careful consideration.  In light of this, patient's family are clear that his wishes would have been to go home, not to initiate  dialysis and to pursue Hospice/comfort measures at home.    Hypotension Hyperkalemia Hyponatremia Acute metabolic encephalopathy Pulmonary hypertension Chronic atrial fibrillation Sick sinus syndrome with pacemaker Gout Hyperlipidemia Hypothyroidism Anemia of chronic disease, normocytic  -Comfort measures -Consult hospice to go home with hospice tomorrow -Reasonable to continue sildenafil, apixaban, and levothyroxine for comfort -Feet liberally       Disposition: Status is: Inpatient  Remains inpatient appropriate because: end of life care  Dispo: The patient is from: Home              Anticipated d/c is to: Home               Anticipated d/c date is: 1 day              Patient currently is not medically stable to d/c.   Difficult to place patient No   The patient was initially admitted with failure to thrive, respiratory failure.  This appears to be primarily driven by weight loss.  Plan is to arrange home hospice and go home with hospice tomorrow or the next day.    Level of care: Med-Surg       MDM: The below labs and imaging reports were reviewed and summarized above.  Medication management as above.      DVT prophylaxis: Place TED hose Start: 09/29/20 1300 apixaban (ELIQUIS) tablet 10 mg  apixaban (ELIQUIS) tablet 5 mg  Code Status: Partial Family Communication: Son, wife, daughter    Consultants:   Pulmonology  Cardiology  Vascular surgery  Palliative care  Nephrology  Procedures:   2/11 thoracentesis 350 cc out, unable to tolerate more -- transudate  2/11 echo EF normal, valves normal, RVSF normal, RH mildly enlarged  2/12 Liver doppler -- c/w right heart failure, otherwise unremarkable  2/14 renal US -- medicorenal disease  2/14 thoracentesis 350 cc more out -- transudate  Antimicrobials:   None   Culture data:   2/11 pleural fluid culture -- ng   2/14 pleural fluid culture -- ng          Subjective: The patient remains very sleepy.  No new fever.  No urine output.  No vomiting, no respiratory complaints, no diarrhea.     Objective: Vitals:   10/06/20 0501 10/06/20 0604 10/06/20 0730 10/06/20 1241  BP: 131/83 131/83 (!) 140/92 117/72  Pulse: 68 68 67 66  Resp: 18  16 16   Temp: (!) 97 F (36.1 C)  (!) 97.5 F (36.4 C) (!) 97.4 F (36.3 C)  TempSrc:   Oral Oral  SpO2: 98%  (!) 88% 100%  Weight:      Height:        Intake/Output Summary (Last 24 hours) at 10/06/2020 1607 Last data filed at 10/05/2020 [id number] Gross per 24 hour  Intake 598.33 ml  Output --  Net 598.33 ml   Filed Weights   10/04/20 0358 10/05/20 0405 10/06/20 0500   Weight: 39.4 kg 39.9 kg 41.3 kg    Examination: General appearance: Hectic elderly male, eyes open, watching television, catatonic     HEENT:   Anicteric, conjunctival pink, lids and lashes normal.  No nasal deformity, discharge, or epistaxis, lips dry, oropharynx tacky dry, no oral lesions Skin: No suspicious rashes or lesions Cardiac: RRR, do not appreciate murmurs, cardiac is hyperdynamic, JVP not visible, 1+ bilateral lower extremity edema Respiratory: Normal respiratory rate and rhythm, lung sounds diminished, lungs clear without rales or wheezes Abdomen: Abdomen soft, some ascites noted, no rigidity or  guarding MSK: Severe diffuse loss of subcutaneous muscle mass and fat. Neuro: Awake, no spontaneous verbalizations, does not follow commands, lifts his arm weakly, appears symmetric. Psych: Judgment impaired, attention diminished, affect blunted         Data Reviewed: I have personally reviewed following labs and imaging studies:  CBC: Recent Labs  Lab 09/30/20 0432 10/01/20 0432 10/02/20 0444 10/03/20 0409 10/06/20 0545  WBC 3.8* 4.7 5.2 7.7 8.0  HGB 10.2* 9.8* 9.0* 9.6* 10.5*  HCT 28.4* 28.8* 25.9* 27.4* 30.1*  MCV 91.0 94.4 94.9 94.5 95.3  PLT 128* 130* 134* 148* 204   Basic Metabolic Panel: Recent Labs  Lab 10/03/20 0409 10/04/20 0502 10/04/20 1353 10/05/20 0651 10/05/20 1445 10/05/20 2206 10/06/20 0545 10/06/20 0713  NA 120* 123*  --  123* 121* 122* 121* 123*  K 5.2* 5.6* 5.3* 5.6* 5.0  --  4.8  --   CL 89* 88*  --  87* 86*  --  87*  --   CO2 22 25  --  23 25  --  24  --   GLUCOSE 133* 122*  --  140* 139*  --  128*  --   BUN 70* 87*  --  101* 104*  --  121*  --   CREATININE 3.02* 3.77*  --  4.44* 4.60*  --  4.79*  --   CALCIUM 9.5 10.2  --  10.3 10.1  --  10.1  --    GFR: Estimated Creatinine Clearance: 6.5 mL/min (A) (by C-G formula based on SCr of 4.79 mg/dL (H)). Liver Function Tests: Recent Labs  Lab 10/02/20 0444 10/03/20 0409  10/04/20 0502 10/05/20 1445  AST 34 25 28 35  ALT ALKPHOS 105 109 100 104  BILITOT 1.0 0.6 0.6 0.9  PROT 6.0* 6.2* 6.3* 6.8  ALBUMIN 3.2* 3.3* 3.3* 3.6   No results for input(s): LIPASE, AMYLASE in the last 168 hours. Recent Labs  Lab 10/06/20 1304  AMMONIA 13   Coagulation Profile: No results for input(s): INR, PROTIME in the last 168 hours. Cardiac Enzymes: No results for input(s): CKTOTAL, CKMB, CKMBINDEX, TROPONINI in the last 168 hours. BNP (last 3 results) No results for input(s): PROBNP in the last 8760 hours. HbA1C: No results for input(s): HGBA1C in the last 72 hours. CBG: Recent Labs  Lab 09/30/20 0909 09/30/20 0957 09/30/20 1126 10/01/20 2123  GLUCAP 37* 119* 93 217*   Lipid Profile: No results for input(s): CHOL, HDL, LDLCALC, TRIG, CHOLHDL, LDLDIRECT in the last 72 hours. Thyroid Function Tests: No results for input(s): TSH, T4TOTAL, FREET4, T3FREE, THYROIDAB in the last 72 hours. Anemia Panel: Recent Labs    10/05/20 0651  VITAMINB12 1,276*   Urine analysis:    Component Value Date/Time   COLORURINE AMBER (A) 10/04/2020 2210   APPEARANCEUR CLOUDY (A) 10/04/2020 2210   LABSPEC 1.024 10/04/2020 2210   PHURINE 5.0 10/04/2020 2210   GLUCOSEU NEGATIVE 10/04/2020 2210   HGBUR NEGATIVE 10/04/2020 2210   BILIRUBINUR NEGATIVE 10/04/2020 2210   KETONESUR NEGATIVE 10/04/2020 2210   PROTEINUR 30 (A) 10/04/2020 2210   NITRITE NEGATIVE 10/04/2020 2210   LEUKOCYTESUR NEGATIVE 10/04/2020 2210   Sepsis Labs: (procalcitonin:4,lacticacidven:4)  ) Recent Results (from the past 240 hour(s))  Resp Panel by RT-PCR (Flu A&B, Covid) Nasopharyngeal Swab     Status: None   Collection Time: 09/29/20 11:02 AM   Specimen: Nasopharyngeal Swab; Nasopharyngeal(NP) swabs in vial transport medium  Result Value Ref Range Status  SARS Coronavirus 2 by RT PCR NEGATIVE NEGATIVE Final    Comment: (NOTE) SARS-CoV-2 target nucleic acids are NOT  DETECTED.  The SARS-CoV-2 RNA is generally detectable in upper respiratory specimens during the acute phase of infection. The lowest concentration of SARS-CoV-2 viral copies this assay can detect is 138 copies/mL. A negative result does not preclude SARS-Cov-2 infection and should not be used as the sole basis for treatment or other patient management decisions. A negative result may occur with  improper specimen collection/handling, submission of specimen other than nasopharyngeal swab, presence of viral mutation(s) within the areas targeted by this assay, and inadequate number of viral copies(<138 copies/mL). A negative result must be combined with clinical observations, patient history, and epidemiological information. The expected result is Negative.  Fact Sheet for Patients:  BloggerCourse.com  Fact Sheet for Healthcare Providers:  SeriousBroker.it  This test is no t yet approved or cleared by the [place] FDA and  has been authorized for detection and/or diagnosis of SARS-CoV-2 by FDA under an Emergency Use Authorization (EUA). This EUA will remain  in effect (meaning this test can be used) for the duration of the COVID-19 declaration under Section 564(b)(1) of the Act, 21 U.S.C.section 360bbb-3(b)(1), unless the authorization is terminated  or revoked sooner.       Influenza A by PCR NEGATIVE NEGATIVE Final   Influenza B by PCR NEGATIVE NEGATIVE Final    Comment: (NOTE) The Xpert Xpress SARS-CoV-2/FLU/RSV plus assay is intended as an aid in the diagnosis of influenza from Nasopharyngeal swab specimens and should not be used as a sole basis for treatment. Nasal washings and aspirates are unacceptable for Xpert Xpress SARS-CoV-2/FLU/RSV testing.  Fact Sheet for Patients: BloggerCourse.com  Fact Sheet for Healthcare Providers: SeriousBroker.it  This test is not yet  approved or cleared by the [place] FDA and has been authorized for detection and/or diagnosis of SARS-CoV-2 by FDA under an Emergency Use Authorization (EUA). This EUA will remain in effect (meaning this test can be used) for the duration of the COVID-19 declaration under Section 564(b)(1) of the Act, 21 U.S.C. section 360bbb-3(b)(1), unless the authorization is terminated or revoked.  Performed at [facility], [address] [place], [place] [address]   MRSA PCR Screening     Status: None   Collection Time: 09/29/20  6:41 PM   Specimen: Nasopharyngeal  Result Value Ref Range Status   MRSA by PCR NEGATIVE NEGATIVE Final    Comment:        The GeneXpert MRSA Assay (FDA approved for NASAL specimens only), is one component of a comprehensive MRSA colonization surveillance program. It is not intended to diagnose MRSA infection nor to guide or monitor treatment for MRSA infections. Performed at [facility], [address] [place], [place] [address]   Body fluid culture     Status: None   Collection Time: 09/30/20 12:00 PM   Specimen: PATH Cytology Pleural fluid  Result Value Ref Range Status   Specimen Description   Final    PLEURAL Performed at [facility], [address] [place], [place] [address]    Special Requests   Final    NONE Performed at [facility], [address] [place], [place] [address]    Gram Stain NO WBC SEEN NO ORGANISMS SEEN   Final   Culture   Final    NO GROWTH 3 DAYS Performed at [facility], [address], [place], [place] [address]  Report Status 10/03/2020 FINAL  Final  Fungus Culture With Stain     Status: None (Preliminary result)   Collection Time: 09/30/20 12:00 PM   Specimen: PATH Cytology Pleural fluid  Result Value Ref Range Status   Fungus Stain Final report  Final    Comment: (NOTE) Performed At: [facility] [address] [place], [place] [id number] Jolene Schimke MD QH:[id number]    Fungus (Mycology) Culture PENDING  Incomplete   Fungal Source PLEURAL  Final    Comment: Performed at [facility], [address] [place], [place] [address]  Acid Fast Smear (AFB)     Status: None   Collection Time: 09/30/20 12:00 PM   Specimen: PATH Cytology Pleural fluid  Result Value Ref Range Status   AFB Specimen Processing Concentration  Final   Acid Fast Smear Negative  Final    Comment: (NOTE) Performed At: [facility] [address] [place], [place] [id number] Jolene Schimke MD VC:[id number]    Source (AFB) PLEURAL  Final    Comment: Performed at [facility], [address] [place], [place] [address]  Fungus Culture Result     Status: None   Collection Time: 09/30/20 12:00 PM  Result Value Ref Range Status   Result 1 Comment  Final    Comment: (NOTE) KOH/Calcofluor preparation:  no fungus observed. Performed At: [facility] [address] [place], [place] [id number] Jolene Schimke MD [id number]   Fungus Culture With Stain     Status: None (Preliminary result)   Collection Time: 10/03/20  3:40 PM   Specimen: PATH Cytology Pleural fluid  Result Value Ref Range Status   Fungus Stain Final report  Final    Comment: (NOTE) Performed At: [facility] [address] [place], [place] [id number] Jolene Schimke MD [id number]    Fungus (Mycology) Culture PENDING  Incomplete   Fungal Source PLEURAL  Final    Comment: Performed at [facility], [address] [place], [place] [address]  Body fluid culture w Gram Stain     Status: None (Preliminary result)   Collection Time: 10/03/20  3:40 PM   Specimen: PATH Cytology Pleural fluid  Result Value Ref Range Status   Specimen Description   Final    PLEURAL Performed at [facility], [address], [place], [place] [address]    Special Requests   Final    PLEURAL Performed at [facility], [address]  [place], [place] [address]    Gram Stain NO WBC SEEN NO ORGANISMS SEEN   Final   Culture   Final    NO GROWTH 2 DAYS Performed at [facility], [address] [place], [place] [address]    Report Status PENDING  Incomplete  Fungus Culture Result     Status: None   Collection Time: 10/03/20  3:40 PM  Result Value Ref Range Status   Result 1 Comment  Final    Comment: (NOTE) KOH/Calcofluor preparation:  no fungus observed. Performed At: [facility] [address] [place], [place] [id number] Jolene Schimke MD [id number]          Radiology Studies: No results found.      Scheduled Meds: . apixaban  10 mg Oral BID   Followed by  . [START ON 10/07/2020] apixaban  5 mg Oral BID  . vitamin C  250 mg Oral BID  . feeding supplement  237 mL Oral TID BM  . levothyroxine  25 mcg Oral  Q0600  . midodrine  5 mg Oral TID WC  . mirtazapine  7.5 mg Oral QHS  . multivitamin with minerals  1 tablet Oral Daily  . sodium zirconium cyclosilicate  10 g Oral BID   Continuous Infusions:    LOS: 6 days    Time spent: 35 minutes    Alberteen Sam, MD [facility] 10/06/2020, 4:07 PM     Please page though AMION or Epic secure chat:  For [employer], Higher education careers adviser

## 2020-10-06 NOTE — Progress Notes (Signed)
[facility] [place], [place] 10/06/20  Subjective:   Patrick Mccormick is a 87 y.o. male with PMHX of pulmonary HTN, HTN, hx of SIADH, persistent atrial fibrillation, iron deficiency anemia and gastrectomy. He presented to the ED with complaints of shortness of breath.   He was admitted with respiratory failure including pleural effusion vs pulmonary embolism vs heart failure exacerbation.   Patient seen this morning with daughter at the bedside He was feed breakfast by his daughter  His daughter stated the family is wanting to take the patient home and make him comfortable. She voices concerns that he will return to previous behaviors. Patient is alert but drowsy Denies shortness of breath and chest pain    Objective:  Vital signs in last 24 hours:  Temp:  [97 F (36.1 C)-98 F (36.7 C)] 97.4 F (36.3 C) (02/17 1241) Pulse Rate:  [66-81] 66 (02/17 1241) Resp:  [15-20] 16 (02/17 1241) BP: (108-140)/(60-92) 117/72 (02/17 1241) SpO2:  [86 %-100 %] 100 % (02/17 1241) Weight:  [41.3 kg] 41.3 kg (02/17 0500)  Weight change: 1.4 kg Filed Weights   10/04/20 0358 10/05/20 0405 10/06/20 0500  Weight: 39.4 kg 39.9 kg 41.3 kg    Intake/Output:    Intake/Output Summary (Last 24 hours) at 10/06/2020 1332 Last data filed at 10/05/2020 1822 Gross per 24 hour  Intake 791.13 ml  Output -  Net 791.13 ml   Physical Exam: General: Ill-appearing Laying in bed  Head: Normocephalic, atraumatic.  moist oral mucosal   Eyes: Anicteric  Neck: Supple, trachea midline  Lungs:  Bilateral rhonchi  On 2 L [place] O2  Heart: Regular rate and rhythm  Abdomen:  Soft, nontender,   Extremities: +1 peripheral edema.  Neurologic: Nonfocal, moving all four extremities  Skin: No lesions    Basic Metabolic Panel:  Recent Labs  Lab 10/03/20 0409 10/04/20 0502 10/04/20 1353 10/05/20 0651 10/05/20 1445 10/05/20 2206 10/06/20 0545 10/06/20 0713  NA 120* 123*  --  123* 121* 122*  121* 123*  K 5.2* 5.6* 5.3* 5.6* 5.0  --  4.8  --   CL 89* 88*  --  87* 86*  --  87*  --   CO2 22 25  --  23 25  --  24  --   GLUCOSE 133* 122*  --  140* 139*  --  128*  --   BUN 70* 87*  --  101* 104*  --  121*  --   CREATININE 3.02* 3.77*  --  4.44* 4.60*  --  4.79*  --   CALCIUM 9.5 10.2  --  10.3 10.1  --  10.1  --      CBC: Recent Labs  Lab 09/30/20 0432 10/01/20 0432 10/02/20 0444 10/03/20 0409 10/06/20 0545  WBC 3.8* 4.7 5.2 7.7 8.0  HGB 10.2* 9.8* 9.0* 9.6* 10.5*  HCT 28.4* 28.8* 25.9* 27.4* 30.1*  MCV 91.0 94.4 94.9 94.5 95.3  PLT 128* 130* 134* 148* 204     No results found for: HEPBSAG, HEPBSAB, HEPBIGM    Microbiology:  Recent Results (from the past 240 hour(s))  Resp Panel by RT-PCR (Flu A&B, Covid) Nasopharyngeal Swab     Status: None   Collection Time: 09/29/20 11:02 AM   Specimen: Nasopharyngeal Swab; Nasopharyngeal(NP) swabs in vial transport medium  Result Value Ref Range Status   SARS Coronavirus 2 by RT PCR NEGATIVE NEGATIVE Final    Comment: (NOTE) SARS-CoV-2 target nucleic acids are NOT DETECTED.  The SARS-CoV-2  RNA is generally detectable in upper respiratory specimens during the acute phase of infection. The lowest concentration of SARS-CoV-2 viral copies this assay can detect is 138 copies/mL. A negative result does not preclude SARS-Cov-2 infection and should not be used as the sole basis for treatment or other patient management decisions. A negative result may occur with  improper specimen collection/handling, submission of specimen other than nasopharyngeal swab, presence of viral mutation(s) within the areas targeted by this assay, and inadequate number of viral copies(<138 copies/mL). A negative result must be combined with clinical observations, patient history, and epidemiological information. The expected result is Negative.  Fact Sheet for Patients:  BloggerCourse.com  Fact Sheet for Healthcare  Providers:  SeriousBroker.it  This test is no t yet approved or cleared by the [place] FDA and  has been authorized for detection and/or diagnosis of SARS-CoV-2 by FDA under an Emergency Use Authorization (EUA). This EUA will remain  in effect (meaning this test can be used) for the duration of the COVID-19 declaration under Section 564(b)(1) of the Act, 21 U.S.C.section 360bbb-3(b)(1), unless the authorization is terminated  or revoked sooner.       Influenza A by PCR NEGATIVE NEGATIVE Final   Influenza B by PCR NEGATIVE NEGATIVE Final    Comment: (NOTE) The Xpert Xpress SARS-CoV-2/FLU/RSV plus assay is intended as an aid in the diagnosis of influenza from Nasopharyngeal swab specimens and should not be used as a sole basis for treatment. Nasal washings and aspirates are unacceptable for Xpert Xpress SARS-CoV-2/FLU/RSV testing.  Fact Sheet for Patients: BloggerCourse.com  Fact Sheet for Healthcare Providers: SeriousBroker.it  This test is not yet approved or cleared by the [place] FDA and has been authorized for detection and/or diagnosis of SARS-CoV-2 by FDA under an Emergency Use Authorization (EUA). This EUA will remain in effect (meaning this test can be used) for the duration of the COVID-19 declaration under Section 564(b)(1) of the Act, 21 U.S.C. section 360bbb-3(b)(1), unless the authorization is terminated or revoked.  Performed at [facility], [address] [place], [place] [address]   MRSA PCR Screening     Status: None   Collection Time: 09/29/20  6:41 PM   Specimen: Nasopharyngeal  Result Value Ref Range Status   MRSA by PCR NEGATIVE NEGATIVE Final    Comment:        The GeneXpert MRSA Assay (FDA approved for NASAL specimens only), is one component of a comprehensive MRSA colonization surveillance program. It is not intended to diagnose MRSA infection nor  to guide or monitor treatment for MRSA infections. Performed at [facility], [address] [place], [place] [address]   Body fluid culture     Status: None   Collection Time: 09/30/20 12:00 PM   Specimen: PATH Cytology Pleural fluid  Result Value Ref Range Status   Specimen Description   Final    PLEURAL Performed at [facility], [address], [place], [place] [address]    Special Requests   Final    NONE Performed at [facility], [address] [place], [place] [address]    Gram Stain NO WBC SEEN NO ORGANISMS SEEN   Final   Culture   Final    NO GROWTH 3 DAYS Performed at [facility], [address] [place], [place] [address]    Report Status 10/03/2020 FINAL  Final  Fungus Culture With Stain     Status: None (Preliminary result)   Collection Time: 09/30/20  12:00 PM   Specimen: PATH Cytology Pleural fluid  Result Value Ref Range Status   Fungus Stain Final report  Final    Comment: (NOTE) Performed At: [facility] [address] [place], [place] [id number] Jolene Schimke MD VQ:[id number]    Fungus (Mycology) Culture PENDING  Incomplete   Fungal Source PLEURAL  Final    Comment: Performed at [facility], [address] [place], [place] [address]  Acid Fast Smear (AFB)     Status: None   Collection Time: 09/30/20 12:00 PM   Specimen: PATH Cytology Pleural fluid  Result Value Ref Range Status   AFB Specimen Processing Concentration  Final   Acid Fast Smear Negative  Final    Comment: (NOTE) Performed At: [facility] [address] [place], [place] [id number] Jolene Schimke MD YT:[id number]    Source (AFB) PLEURAL  Final    Comment: Performed at [facility], [address] [place], [place] [address]  Fungus Culture Result     Status: None   Collection Time: 09/30/20 12:00 PM  Result Value Ref Range Status   Result 1 Comment  Final    Comment: (NOTE) KOH/Calcofluor preparation:   no fungus observed. Performed At: [facility] [address] [place], [place] [id number] Jolene Schimke MD [id number]   Fungus Culture With Stain     Status: None (Preliminary result)   Collection Time: 10/03/20  3:40 PM   Specimen: PATH Cytology Pleural fluid  Result Value Ref Range Status   Fungus Stain Final report  Final    Comment: (NOTE) Performed At: [facility] [address] [place], [place] [id number] Jolene Schimke MD YI:[id number]    Fungus (Mycology) Culture PENDING  Incomplete   Fungal Source PLEURAL  Final    Comment: Performed at [facility], [address] [place], [place] [address]  Body fluid culture w Gram Stain     Status: None (Preliminary result)   Collection Time: 10/03/20  3:40 PM   Specimen: PATH Cytology Pleural fluid  Result Value Ref Range Status   Specimen Description   Final    PLEURAL Performed at [facility], [address], [place], [place] [address]    Special Requests   Final    PLEURAL Performed at [facility], [address] [place], [place] [address]    Gram Stain NO WBC SEEN NO ORGANISMS SEEN   Final   Culture   Final    NO GROWTH 2 DAYS Performed at [facility], [address] [place], [place] [address]    Report Status PENDING  Incomplete  Fungus Culture Result     Status: None   Collection Time: 10/03/20  3:40 PM  Result Value Ref Range Status   Result 1 Comment  Final    Comment: (NOTE) KOH/Calcofluor preparation:  no fungus observed. Performed At: [facility] [address] [place], [place] [id number] Jolene Schimke MD [id number]     Coagulation Studies: No results for input(s): LABPROT, INR in the last 72 hours.  Urinalysis: Recent Labs    10/04/20 2210  COLORURINE AMBER*  LABSPEC 1.024  PHURINE 5.0  GLUCOSEU NEGATIVE  HGBUR NEGATIVE  BILIRUBINUR NEGATIVE  KETONESUR NEGATIVE  PROTEINUR 30*  NITRITE NEGATIVE  LEUKOCYTESUR NEGATIVE       Imaging: No results found.   Medications:    . allopurinol  100 mg Oral Daily  . apixaban  10 mg Oral BID   Followed by  . [START  ON 10/07/2020] apixaban  5 mg Oral BID  . vitamin C  250 mg Oral BID  . atorvastatin  20 mg Oral Daily  . feeding supplement  237 mL Oral TID BM  . levothyroxine  25 mcg Oral Q0600  . midodrine  5 mg Oral TID WC  . mirtazapine  7.5 mg Oral QHS  . multivitamin with minerals  1 tablet Oral Daily  . sodium zirconium cyclosilicate  10 g Oral BID  . tolvaptan  15 mg Oral Daily   metoprolol tartrate, ondansetron **OR** ondansetron (ZOFRAN) IV, sodium chloride  Assessment/ Plan:  87 y.o. male with PMHX of pulmonary HTN, HTN, hx of SIADH, persistent atrial fibrillation, iron deficiency anemia and gastrectomy. was admitted on 09/29/2020   Principal Problem:   Respiratory distress Active Problems:   Pleural effusion due to congestive heart failure (HCC)   Pulmonary hypertension (HCC)   Atrial fibrillation, chronic (HCC)   History of partial gastrectomy   Protein-calorie malnutrition, severe  Shortness of breath [R06.02] Hyponatremia [E87.1] Pleural effusion [J90] Pleural effusion due to congestive heart failure (HCC) [I50.9] Acute respiratory failure with hypoxia (HCC) [J96.01]  #. Acute kidney injury on CKD stage 3B -baseline creatinine 1.48 on 06/22/20. -No obstruction on CT abd/pelvis -Current level -4.79 -lasix drip discontinued -ammonia checked for toxicity-13 -discussed options with family -patient asked if he would like HD, he stated yes then no. -Daughter feels he would not comply with the strict treatment -Discussed barriers to be successful if patient returned home-ETOH, diet and nutrition, support system -appreciate palliative involvement  #. Hyponatremia -Baseline appears to be 126-135 -Sodium at baseline -positive history of SIADH and hyponatremia --talvaptan ordered daiy - Lokelma increased to BID -banana bag given  yesterday  #. Anemia of CKD  Lab Results  Component Value Date   HGB 10.5 (L) 10/06/2020    Continues to improve  # Hypotension Midodrine started on 10/01/20 BP stable 100s/60s   LOS: 6 Wendee Beavers, NP 2/17/20221:32 PM  [facility] [place], [place] [phone]

## 2020-10-06 NOTE — Progress Notes (Addendum)
Daily Progress Note   Patient Name: Patrick Mccormick       Date: 10/06/2020 DOB: 1932-01-26  Age: 87 y.o. MRN#: [id number] Attending Physician: Alberteen Sam, * Primary Care Physician: Conan Bowens., MD Admit Date: 09/29/2020  Reason for Consultation/Follow-up: Establishing goals of care  Subjective: Patient is resting in bed. His daughter is at bedside. Nephrology is at bedside finishing conversation.   Patient resting with eyes closed. Daughter discusses his decline over the past couple of years. She states family has tried to motivate him to eat and drink but he would not, and wanted to drink wine. We discuss his nutritional and hydration status. We discuss his renal function. We discuss next steps as well as the future, and multiple different scenarios. Daughter called her brother on speaker phone. Reviewed previous discussion. They discuss possibility of taking him home and focusing on comfort and dignity. Discussion of long term goals. Daughter states his QOL is sitting in his chair watching t.v. She states he does not want to go to the dinning room. He also does not want to socialize with others. Discussed keeping his wishes and goals at the center of planning care. Attempts made to have GOC conversation with patient, however when awakened he is agitated trying to get up to use the rest room, and confused.  All questions answered of family.      ADDENDUM: Contacted by the primary team provider with plans for D/C home with hospice as soon as possible, and request to reach out to family. Spoke with daughter. She states the family would like to take him home with hospice. With conversation, daughter was unaware that hospice would come into the home for visits, but not provide around the  clock care. They are unsure of how to move forward. They would like [facility] services, and [facility] was called.    Length of Stay: 6  Current Medications: Scheduled Meds:  . allopurinol  100 mg Oral Daily  . apixaban  10 mg Oral BID   Followed by  . [START ON 10/07/2020] apixaban  5 mg Oral BID  . vitamin C  250 mg Oral BID  . atorvastatin  20 mg Oral Daily  . feeding supplement  237 mL Oral TID BM  . levothyroxine  25 mcg Oral Q0600  . midodrine  5 mg Oral TID WC  . mirtazapine  7.5 mg Oral QHS  . multivitamin with minerals  1 tablet Oral Daily  . sodium zirconium cyclosilicate  10 g Oral BID  . tolvaptan  15 mg Oral Daily    Continuous Infusions:   PRN Meds: metoprolol tartrate, ondansetron **OR** ondansetron (ZOFRAN) IV, sodium chloride  Physical Exam Pulmonary:     Effort: Pulmonary effort is normal.  Neurological:     Mental Status: He is alert.             Vital Signs: BP (!) 140/92 (BP Location: Left Arm)   Pulse 67   Temp (!) 97.5 F (36.4 C) (Oral)   Resp 16   Ht 5\' 7"  (1.702 m)   Wt 41.3 kg   SpO2 (!) 88%   BMI 14.26 kg/m  SpO2: SpO2: (!) 88 % O2 Device: O2 Device: Nasal Cannula O2 Flow Rate: O2 Flow Rate (L/min): 1 L/min  Intake/output summary:   Intake/Output Summary (Last 24 hours) at 10/06/2020 1230 Last data filed at 10/05/2020 10/07/2020 Gross per 24 hour  Intake 791.13 ml  Output --  Net 791.13 ml   LBM: Last BM Date: 10/04/20 Baseline Weight: Weight: 47.3 kg Most recent weight: Weight: 41.3 kg  Flowsheet Rows   Flowsheet Row Most Recent Value  Intake Tab   Referral Department Hospitalist  Unit at Time of Referral Cardiac/Telemetry Unit  Palliative Care Primary Diagnosis Pulmonary  Date Notified 10/03/20  Palliative Care Type New Palliative care  Reason for referral Clarify Goals of Care  Date of Admission 09/29/20  Date first seen by Palliative Care 10/04/20  # of days Palliative referral response time 1 Day(s)  # of days IP  prior to Palliative referral 4  Clinical Assessment   Palliative Performance Scale Score 30%  Pain Max last 24 hours Not able to report  Pain Min Last 24 hours Not able to report  Dyspnea Max Last 24 Hours Not able to report  Dyspnea Min Last 24 hours Not able to report  Psychosocial & Spiritual Assessment   Palliative Care Outcomes       Patient Active Problem List   Diagnosis Date Noted  . Protein-calorie malnutrition, severe 09/30/2020  . Pleural effusion due to congestive heart failure (HCC) 09/29/2020  . Pulmonary hypertension (HCC) 09/29/2020  . Atrial fibrillation, chronic (HCC) 09/29/2020  . History of partial gastrectomy 09/29/2020  . Respiratory distress 09/29/2020    Palliative Care Assessment & Plan    Recommendations/Plan: Family deciding on plans moving forward.  ADDENDUM: Home with hospice per primary provider. [facility] contacted per daughter.     Code Status:    Code Status Orders  (From admission, onward)         Start     Ordered   09/29/20 1301  Limited resuscitation (code)  Continuous       Question Answer Comment  In the event of cardiac or respiratory ARREST: Initiate Code Blue, Call Rapid Response No   In the event of cardiac or respiratory ARREST: Perform CPR No   In the event of cardiac or respiratory ARREST: Perform Intubation/Mechanical Ventilation Yes   In the event of cardiac or respiratory ARREST: Use NIPPV/BiPAp only if indicated Yes   In the event of cardiac or respiratory ARREST: Administer ACLS medications if indicated No   In the event of cardiac or respiratory ARREST: Perform Defibrillation or Cardioversion if indicated No      09/29/20 1303  Code Status History    This patient has a current code status but no historical code status.   Advance Care Planning Activity      Prognosis:  Very poor  Care plan was discussed with nephrology  Thank you for allowing the Palliative Medicine Team to assist in the care  of this patient.   Time In: 11:00 Time Out: 12:00 Total Time 60 min Prolonged Time Billed  no       Greater than 50%  of this time was spent counseling and coordinating care related to the above assessment and plan.  Morton Stall, NP  Please contact Palliative Medicine Team phone at [phone] for questions and concerns.

## 2020-10-06 NOTE — Progress Notes (Signed)
PT Cancellation Note  Patient Details Name: Treylan Mcclintock MRN: [id number] DOB: 1932/08/12   Cancelled Treatment:    Reason Eval/Treat Not Completed: Patient at procedure or test/unavailable.  Pt chart reviewed and attempted to see pt, however palliative care was in room for evaluation.  Pt will be seen at later time/date as medically appropriate.     Nolon Bussing, PT, DPT 10/06/20, 1:55 PM

## 2020-10-06 NOTE — Evaluation (Signed)
Clinical/Bedside Swallow Evaluation Patient Details  Name: Patrick Mccormick MRN: [id number] Date of Birth: 08-06-1932  Today's Date: 10/06/2020 Time: SLP Start Time (ACUTE ONLY): 1140 SLP Stop Time (ACUTE ONLY): 1240 SLP Time Calculation (min) (ACUTE ONLY): 60 min  Past Medical History: History reviewed. No pertinent past medical history. Past Surgical History: History reviewed. No pertinent surgical history. HPI:  Per admitting H&P " Patrick Mccormick is a 87 y.o. male with medical history significant for pulmonary hypertension, hypertension, history of SIADH, unintended weight loss, history of gastrectomy, persistent atrial fibrillation, iron deficiency anemia, presented to the emergency department for chief concerns of shortness of breath.      At baseline, patient is not on home oxygen.     He endorses shortness of breath x one week. He has unintentionally lost about 40 lbs in 1.5 years (150ish to 110) and extreme exhaustion and does not get hungry. He reports poor appetite.      He further endorses new watery diarrhea, three times per day for one week. He denies blood.      He endorses history of persistent right pleural effusion status post thoracentesis in which he had about two liters removed from only his right lungs about two years ago.      Social history: He is married and lives with his spouse of 50 years. 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 He has never been a tobacco user. He drinks about two glasses of wine per night."   Assessment / Plan / Recommendation Clinical Impression  Pt presents with mild to moderate oral dysphagia but no s/s of aspiration with any consistency tested today. Oral mech exam revealed structures to be functioning adequately with only overall weakness secondary to overall deconditioning. Pt was very pleasant and cooperative, required cues to keep eyes open during eval. Able  to follow simple directions and respond to questions with one word after cues and repetition. Daughter was present and very supportive, has been feeding meals while here in the hospital. Per discussion with daughter, PO intake has been poor for a long time and has not been a priority of Patient in the past. Daughter reports good intake over the last 24 hrs. Vocal quality remained clear after swallowing thin liquids, purees and solids. Noted oral transit delay with solid. Rec Dys 2 diet for ease of chewing and to limit fatigue in hopes of improving intake. Discussed recommendations and aspiration precautions with daughter. ST to Check toleration of diet 1-2 days and advance if Pt's overall condition has improved or of he does not like the consistency. Rec meds whole in applesauce. SLP Visit Diagnosis: Dysphagia, oral phase (R13.11)    Aspiration Risk  Mild aspiration risk    Diet Recommendation Dysphagia 2 (Fine chop)   Liquid Administration via: Straw Medication Administration: Whole meds with puree Supervision: Staff to assist with self feeding Compensations: Minimize environmental distractions;Slow rate;Small sips/bites Postural Changes: Seated upright at 90 degrees;Remain upright for at least 30 minutes after po intake    Other  Recommendations     Follow up Recommendations Other (comment) (Family is thinking of taking Pt home)      Frequency and Duration min 2x/week  1 week       Prognosis   Good for toleration of diet.     Swallow Study   General Date of Onset: 09/29/20 HPI: Per admitting H&P " Patrick Mccormick is a 87  y.o. male with medical history significant for pulmonary hypertension, hypertension, history of SIADH, unintended weight loss, history of gastrectomy, persistent atrial fibrillation, iron deficiency anemia, presented to the emergency department for chief concerns of shortness of breath.      At baseline, patient is not on home oxygen.     He endorses shortness of  breath x one week. He has unintentionally lost about 40 lbs in 1.5 years (150ish to 110) and extreme exhaustion and does not get hungry. He reports poor appetite.      He further endorses new watery diarrhea, three times per day for one week. He denies blood.      He endorses history of persistent right pleural effusion status post thoracentesis in which he had about two liters removed from only his right lungs about two years ago.      Social history: He is married and lives with his spouse of 50 years. 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 He has never been a tobacco user. He drinks about two glasses of wine per night." Type of Study: Bedside Swallow Evaluation Diet Prior to this Study: Other (Comment) (Renal) Respiratory Status: Nasal cannula History of Recent Intubation: No Behavior/Cognition: Cooperative;Pleasant mood;Confused;Requires cueing Oral Cavity Assessment: Within Functional Limits Oral Care Completed by SLP: No Oral Cavity - Dentition: Adequate natural dentition Self-Feeding Abilities: Total assist;Needs set up Patient Positioning: Upright in bed Baseline Vocal Quality: Hoarse    Oral/Motor/Sensory Function Overall Oral Motor/Sensory Function: Within functional limits   Ice Chips Ice chips: Within functional limits Presentation: Spoon   Thin Liquid Thin Liquid: Within functional limits Presentation: Cup;Spoon;Straw    Nectar Thick Nectar Thick Liquid: Not tested   Honey Thick Honey Thick Liquid: Not tested   Puree Puree: Within functional limits Presentation: Spoon   Solid     Solid: Impaired Presentation: Self Fed (Asssited) Oral Phase Impairments: Impaired mastication Oral Phase Functional Implications: Prolonged oral transit      Eather Colas 10/06/2020,1:11 PM

## 2020-10-06 NOTE — Progress Notes (Signed)
Nutrition Follow Up Note   DOCUMENTATION CODES:   Severe malnutrition in context of chronic illness  INTERVENTION:   Ensure Enlive po TID, each supplement provides 350 kcal and 20 grams of protein  Magic cup TID with meals, each supplement provides 290 kcal and 9 grams of protein  MVI daily   Vitamin C 258m po BID   Pt at high refeed risk; recommend monitor potassium, magnesium and phosphorus labs daily until stable  NUTRITION DIAGNOSIS:   Severe Malnutrition related to chronic illness (pulmonary HTN, CHF, h/o gastrectomy) as evidenced by 27 percent weight loss in one year,severe fat depletion,severe muscle depletion.  GOAL:   Patient will meet greater than or equal to 90% of their needs  -progressing   MONITOR:   PO intake,Supplement acceptance,Labs,Weight trends,Skin,I & O's  ASSESSMENT:   87y.o. 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  Met with pt and pt's daughter in room today. Pt with confusion today and is unable to provide any history. Daughter at bedside reports pt with improved appetite and oral intake; daughter took pictures of all of patient's meal trays so RD was able to produce a calorie count for patient. Pt meeting 85% of his kcal needs and 58% of his protein needs. Pt is enjoying the [employer] is not really drinking the Ensure supplements. Pt's copper and B12 returned normal. Daughter at bedside reports plans to take patient home and keep him comfortable. It does not sound like family would want a feeding tube.   Per chart, pt is weight stable since admit.    Medications reviewed and include: allopurinol, vitamin C, synthroid, remeron, MVI,  lokelma   Labs reviewed: Na 123(L), BUN 121(H), creat 4.79(H) Hgb 10.5(L), Hct 30.1(L)  Diet Order:    Diet Order            DIET DYS 2 Room service appropriate? Yes; Fluid consistency: Thin  Diet effective now                EDUCATION NEEDS:   Education needs have been addressed  Skin:  Skin Assessment: Reviewed RN Assessment  Last BM:  2/15  Height:   Ht Readings from Last 1 Encounters:  09/29/20 '5\' 7"'  (1.702 m)    Weight:   Wt Readings from Last 1 Encounters:  10/06/20 41.3 kg    Ideal Body Weight:  67.27 kg  BMI:  Body mass index is 14.26 kg/m.  Estimated Nutritional Needs:   Kcal:  1600-1800kcal/day  Protein:  80-90g/day  Fluid:  1.2-1.4L/day  CKoleen DistanceMS, RD, LDN Please refer to [facility]for RD and/or RD on-call/weekend/after hours pager

## 2020-10-06 NOTE — Progress Notes (Signed)
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  Admits to wt loss>40lbs.  He further endorses new watery diarrhea, three times per day for one week. He denies blood. He endorses history of persistent right pleural effusion status post thoracentesis in which he had about two liters removed from only his right lungs about two years ago.  : He is married and lives with his spouse of 50 years. 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 He has never been a tobacco user. He drinks about two glasses of wine per night. PCCM consultation for pulmnary hypertension.     Patient had thoracentesis donex2 in 2020 and "was trying to control that",  He had one thoracentesis after admission here at [facility] and was able to get 300cc thoracentesis done but asked for procedure to be stopped half way throughout.    [name] is Patrick Mccormick is present today. She shares he is a chronic drinker of alcohol 1 day prior to admission he drank 3 glasses of wine.  He had TTE which was essentially normal.   -10/02/20-patient weaned to 2L/min [place].  He had episodes of hypotension despite midordrine.  He developed worsening aki overnight and renal consultation has been placed.  He has not diuresed well.  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  Vascular surgery has been  consulted.  Na levels are still low 120.  He remains without leukocytosis or fevers. Patient has been on lasix , he has IV fluids ordered today, I can dc lasix for now. Will repeat CXR today.  If patient is not surgical candidate he will need riociguat for CTEPH.   10/03/2020- patient is stable this am.  I called his son and reviewed hospital course and differential. He has significant aki and we reviewed case today.   10/04/2020- patient had repeat thoracentesis - removed 340cc from right pleural space. He is breathing better now, he was weaned down from 2L/min [place] to 1L/min [place].  He is oliguric with worsening AKI, however mentating well and is pleasant.  He is with severe senile hearing loss. Fluid with negative cytology for atypia 09/30/20 specimen.  Reviewed careplan with renal and primary team today.   10/05/2020- patient s/p cardiac and palliative evaluation.  He is unchanged from yesterday.  He is with overall poor prognosis, goals of care advanced to partial code status. Respiratory status remains unchanged overnight with spO2>90% on 1L/min [place].  Daughter present in room [facility] Patrick Mccormick) we disucused his hospital course and prognosis.  She shares family is leaning towards taking him home on comfort/hospice.    10/06/2020- patient is examined at bedside.  Family including son, daughter and wife of patient in room.  They discussed hospice today.    PAST MEDICAL  HISTORY   History reviewed. No pertinent past medical history.   SURGICAL HISTORY   History reviewed. No pertinent surgical history.   FAMILY HISTORY   History reviewed. No pertinent family history.   SOCIAL HISTORY   Social History   Tobacco Use  . Smoking status: Never Smoker  . Smokeless tobacco: Never Used     MEDICATIONS    Home Medication:    Current Medication:  Current Facility-Administered Medications:  .  allopurinol (ZYLOPRIM) tablet 100 mg, 100 mg, Oral, Daily, Patrick Courser, MD, 100 mg at  10/06/20 0851 .  apixaban (ELIQUIS) tablet 10 mg, 10 mg, Oral, BID, 10 mg at 10/06/20 0850 **FOLLOWED BY** [START ON 10/07/2020] apixaban (ELIQUIS) tablet 5 mg, 5 mg, Oral, BID, Mccormick, [name], MD .  ascorbic acid (VITAMIN C) tablet 250 mg, 250 mg, Oral, BID, Patrick Courser, MD, 250 mg at 10/06/20 0851 .  atorvastatin (LIPITOR) tablet 20 mg, 20 mg, Oral, Daily, Patrick Courser, MD, 20 mg at 10/06/20 0851 .  feeding supplement (ENSURE ENLIVE / ENSURE PLUS) liquid 237 mL, 237 mL, Oral, TID BM, Patrick Courser, MD, 237 mL at 10/06/20 0858 .  levothyroxine (SYNTHROID) tablet 25 mcg, 25 mcg, Oral, Q0600, Patrick Courser, MD, 25 mcg at 10/06/20 0603 .  metoprolol tartrate (LOPRESSOR) injection 5 mg, 5 mg, Intravenous, Q4H PRN, Patrick Courser, MD .  midodrine (PROAMATINE) tablet 5 mg, 5 mg, Oral, TID WC, Mccormick, Patrick P, MD .  mirtazapine (REMERON) tablet 7.5 mg, 7.5 mg, Oral, QHS, Mccormick, Patrick Neat, MD, 7.5 mg at 10/05/20 2118 .  multivitamin with minerals tablet 1 tablet, 1 tablet, Oral, Daily, Patrick Courser, MD, 1 tablet at 10/06/20 0851 .  ondansetron (ZOFRAN) tablet 4 mg, 4 mg, Oral, Q6H PRN **OR** ondansetron (ZOFRAN) injection 4 mg, 4 mg, Intravenous, Q6H PRN, Patrick Courser, MD, 4 mg at 10/03/20 1633 .  sodium chloride (OCEAN) 0.65 % nasal spray 1 spray, 1 spray, Each Nare, PRN, Patrick Courser, MD .  sodium zirconium cyclosilicate (LOKELMA) packet 10 g, 10 g, Oral, BID, Patrick Courser, MD, 10 g at 10/06/20 0454    ALLERGIES   Patient has no known allergies.     REVIEW OF SYSTEMS    Review of Systems:  Gen:  Denies  fever, sweats, chills weigh loss  HEENT: Denies blurred vision, double vision, ear pain, eye pain, hearing loss, nose bleeds, sore throat Cardiac:  No dizziness, chest pain or heaviness, chest tightness,edema Resp:   Denies cough or sputum porduction, shortness of breath,wheezing, hemoptysis,  Gi: Denies swallowing difficulty, stomach pain, nausea or vomiting, diarrhea,  constipation, bowel incontinence Gu:  Denies bladder incontinence, burning urine Ext:   Denies Joint pain, stiffness or swelling Skin: Denies  skin rash, easy bruising or bleeding or hives Endoc:  Denies polyuria, polydipsia , polyphagia or weight change Psych:   Denies depression, insomnia or hallucinations   Other:  All other systems negative   VS: BP (!) 140/92 (BP Location: Left Arm)   Pulse 67   Temp (!) 97.5 F (36.4 C) (Oral)   Resp 16   Ht  (1.702 m)   Wt 41.3 kg   SpO2 (!) 88%   BMI 14.26 kg/m      PHYSICAL EXAM    GENERAL:NAD, no fevers, chills, no weakness no fatigue HEAD: Normocephalic, atraumatic.  EYES: Pupils equal, round, reactive to light. Extraocular muscles intact. No scleral icterus.  MOUTH: Moist mucosal membrane. Dentition intact. No abscess noted.  EAR, NOSE, THROAT: Clear without exudates. No  external lesions.  NECK: Supple. No thyromegaly. No nodules. No JVD.  PULMONARY: Decreased breath sounds bilaterally  CARDIOVASCULAR: S1 and S2. Regular rate and rhythm. No murmurs, rubs, or gallops. No edema. Pedal pulses 2+ bilaterally.  GASTROINTESTINAL: Soft, nontender, nondistended. No masses. Positive bowel sounds. No hepatosplenomegaly.  MUSCULOSKELETAL: No swelling, clubbing, or edema. Range of motion full in all extremities.  NEUROLOGIC: Cranial nerves II through XII are intact. No gross focal neurological deficits. Sensation intact. Reflexes intact.  SKIN: No ulceration, lesions, rashes, or cyanosis. Skin warm and dry. Turgor intact.  PSYCHIATRIC: Mood, affect within normal limits. The patient is awake, alert and oriented x 3. Insight, judgment intact.       IMAGING    DG Chest 2 View  Result Date: 10/03/2020 CLINICAL DATA:  Acute renal injury EXAM: CHEST - 2 VIEW COMPARISON:  10/02/2020 FINDINGS: LEFT-sided pacemaker overlies stable cardiac silhouette. Large bilateral loculated pleural effusions. The LEFT effusion appears slightly decreased.  No pneumothorax. Calcified pleural plaque over the LEFT hemidiaphragm. IMPRESSION: 1. Bilateral large loculated pleural effusions. 2. LEFT effusion may be slightly reduced. 3. No pulmonary edema or pneumothorax. Electronically Signed   By: Genevive Bi M.D.   On: 10/03/2020 10:23   CT Angio Chest PE W and/or Wo Contrast  Addendum Date: 09/29/2020   ADDENDUM REPORT: 09/29/2020 15:35 ADDENDUM: These results were called by telephone at the time of interpretation on 09/29/2020 at 3:35 pm to provider AMY COX , who verbally acknowledged these results. Electronically Signed   By: Kreg Shropshire M.D.   On: 09/29/2020 15:35   Result Date: 09/29/2020 CLINICAL DATA:  Dyspnea EXAM: CT ANGIOGRAPHY CHEST WITH CONTRAST TECHNIQUE: Multidetector CT imaging of the chest was performed using the standard protocol during bolus administration of intravenous contrast. Multiplanar CT image reconstructions and MIPs were obtained to evaluate the vascular anatomy. CONTRAST:  75mL OMNIPAQUE IOHEXOL 350 MG/ML SOLN COMPARISON:  Radiograph 09/29/2020 FINDINGS: Cardiovascular: Satisfactory opacification of pulmonary arteries. Some thin linear web-like bands of hypoattenuation in the distal left main pulmonary artery and lingular branches. Some more anti dependent hypoattenuation within the segmental pulmonary arteries of the left lower lobe have an appearance favored to be laminar flow and mixing artifact though a more subacute to remote pulmonary embolism is not fully excluded. Central pulmonary arteries are enlarged. There is marked cardiomegaly with four-chamber enlargement. Three-vessel coronary artery atherosclerosis is present. Left chest wall pacer pack is noted with leads at the right atrium, coronary sinus and cardiac apex. Trace pericardial effusion. Suboptimal opacification of the aorta for luminal assessment. Atherosclerotic plaque within the normal caliber aorta. Normal 3 vessel branching of the aortic arch. Reflux of contrast  in the hepatic veins and IVC. Mediastinum/Nodes: Diffuse edematous changes throughout the mediastinum. Scattered low-attenuation mediastinal and hilar lymph nodes favored to be reactive/edematous. No concerning axillary adenopathy. Fluid-filled, patulous thoracic esophagus. Trachea is unremarkable. No concerning thyroid abnormality though poorly evaluated with superimposed streak artifact. Lungs/Pleura: Complex, bilateral loculated pleural effusions which are predominantly low-attenuation. There are regions of dense basilar pleural calcification in both lungs. A more diffuse pleural thickening is noted in the right lung as well. There are areas of adjacent passive atelectasis with additional more focal nodular atelectatic changes in the right lung (6/64). Could reflect some rounded atelectasis though underlying infection or disease is not fully excluded. Additional areas of bandlike reticular opacity with architectural distortion and scarring. Diffuse airways thickening and scattered secretions are noted as well. No pneumothorax. Upper Abdomen: Diffuse edematous changes noted throughout the  upper abdomen and mesentery with small volume ascites. Extensive atherosclerotic plaque in the upper abdominal aorta. Calcified septation in the infrarenal abdominal aorta may reflect sequela of prior dissection or lamellar calcification of the aorta itself, incompletely assessed on this exam. Postsurgical changes from prior gastric surgery. Musculoskeletal: The osseous structures appear diffusely demineralized which may limit detection of small or nondisplaced fractures. No acute osseous abnormality or suspicious osseous lesion. Degenerative changes are present in the imaged spine and shoulders. Diffuse body wall edema. Review of the MIP images confirms the above findings. IMPRESSION: 1. Web-like filling defects in the distal left main pulmonary artery and extending into the lingular branches may reflect subacute to chronic  pulmonary embolism with acute pulmonary embolism less favored. More antidependent incomplete opacification of the left lower lobe pulmonary arteries could reflect laminar flow artifact though underlying embolism is not excluded. Evaluation for right heart strain is precluded in the setting of diffuse cardiomegaly and four-chamber cardiac enlargement though there is evidence of elevated right heart pressures and right-sided dysfunction with enlarged pulmonary arteries and refluxing contrast into the hepatic veins and IVC. Consider further characterization with echocardiography as clinically warranted. 2. Complex, bilateral loculated pleural effusions which are predominantly low-attenuation. Associated regions of dense basilar pleural calcification in both lungs. Findings can be on the spectrum of underlying asbestos related pleural disease though the sterility of these collections cannot be fully ascertained on an imaging basis. Extensive areas of passive atelectasis. More focal nodular atelectatic changes in the right lung as well. Could reflect some rounded atelectasis though underlying infection or disease is not fully excluded. Recommend attention on follow-up imaging. 3. Features of anasarca/volume overload with diffuse edematous changes throughout the mediastinum, mediastinum, upper abdomen, mesentery and body wall with small volume abdominal ascites. 4. Aortic Atherosclerosis (ICD10-I70.0). Currently attempting to contact the ordering provider with a critical value result. Addendum will be submitted upon case discussion. Electronically Signed: By: Kreg Shropshire M.D. On: 09/29/2020 15:28   CT ABDOMEN PELVIS W CONTRAST  Result Date: 09/30/2020 CLINICAL DATA:  Unintentional weight loss EXAM: CT ABDOMEN AND PELVIS WITH CONTRAST TECHNIQUE: Multidetector CT imaging of the abdomen and pelvis was performed using the standard protocol following bolus administration of intravenous contrast. CONTRAST:  60mL OMNIPAQUE  IOHEXOL 300 MG/ML  SOLN COMPARISON:  None. FINDINGS: Lower chest: Complex moderate right pleural effusion is again identified with associated pleural thickening. Small focus of extrapleural gas within this collection likely relates to recent thoracentesis. There is associated compressive atelectasis of the right lung base. Partially loculated left pleural effusion largely within the left major fissure appears stable since prior CT examination of 09/29/2020. Dense pleural calcifications at the left lung base likely relate to remote trauma or inflammation. Mild global cardiomegaly with particular enlargement of the a right ventricle and right atrium are again identified. Pacemaker leads are seen within the right heart and left ventricular venous outflow. Extensive calcifications are noted within the coronary arteries. Hepatobiliary: Reflux of contrast into the hepatic venous system is in keeping with at least some degree of right heart failure. Tiny probable cyst within the inferior right hepatic lobe. Liver otherwise unremarkable. No intra or extrahepatic biliary ductal dilation. Gallbladder unremarkable. Pancreas: Unremarkable Spleen: Unremarkable Adrenals/Urinary Tract: The adrenal glands are unremarkable. The kidneys are normal in position. There is mild-to-moderate bilateral renal cortical atrophy noted. Bilateral simple cortical cysts are identified. The kidneys are otherwise unremarkable. The bladder is unremarkable. Stomach/Bowel: Surgical changes of a partial gastrectomy are identified. Moderate sigmoid diverticulosis. The stomach,  small bowel, and large bowel are otherwise unremarkable. No evidence of obstruction or focal inflammation. Appendix normal. No free intraperitoneal gas. Mild ascites is present. Vascular/Lymphatic: There is extensive aortoiliac atherosclerotic calcification with particularly prominent atherosclerotic calcification at the origin of the a renal and mesenteric arterial vasculature  almost certainly resulting in hemodynamically significant stenoses, not well characterized on this examination. Short segment focal dissection within the infrarenal abdominal aorta, likely the result of a remote penetrating atherosclerotic ulcer results in mild ectasia of the infrarenal abdominal aorta without frank aneurysm formation, with a maximal transaxial dimension of 2.6 cm. The common iliac arteries are ectatic bilaterally, left greater than right, without frank aneurysm formation. Extensive atherosclerotic calcification is noted within the lower extremity arterial inflow and visualized outflow. Reproductive: Prostate is unremarkable. Other: There is extensive subcutaneous edema noted throughout the body wall as well as retroperitoneal edema in keeping with changes of anasarca. Musculoskeletal: Degenerative changes are seen within the lumbar spine. No suspicious lytic or blastic bone lesions are seen. Degenerative changes are noted within the hips bilaterally. No acute bone abnormality. IMPRESSION: Complex right hydropneumothorax with small gaseous component likely related to recent thoracentesis. Cardiomegaly and extensive coronary artery calcification. Large mint of the right heart and reflux of contrast into the hepatic venous system in keeping with at least some degree of right heart failure. Dense calcification at the left lung base likely result of remote trauma or inflammation. Diffuse body wall subcutaneous edema, mild ascites, and retroperitoneal edema most in keeping with moderate anasarca, possibly the result of cardiogenic failure. Peripheral vascular disease with extensive atherosclerotic calcification at the origin of the mesenteric and renal vasculature almost certainly resulting in hemodynamically significant stenosis. Clinical correlation for signs and symptoms of chronic mesenteric ischemia and/or significant renal artery stenosis is recommended. Aortic Atherosclerosis (ICD10-I70.0).  Electronically Signed   By: Helyn Numbers MD   On: 09/30/2020 13:09   US RENAL  Result Date: 10/03/2020 CLINICAL DATA:  Acute kidney injury in a 87 year old male EXAM: RENAL / URINARY TRACT ULTRASOUND COMPLETE COMPARISON:  Abdomen and pelvis CT from 2022, February 2022 FINDINGS: Right Kidney: Renal measurements: 9.0 x 4.1 x 5.7 cm = volume: 110 mL. Slight increased echogenicity of renal cortex. Cyst in the interpolar aspect of the RIGHT kidney 2.5 x 2.4 x 1.4 cm. No hydronephrosis. Left Kidney: Renal measurements: 9.6 x 4.8 x 4.8 cm = volume: 114 mL. Mild increased echogenicity the without hydronephrosis or focal lesion aside from a small cyst in the lower pole. Bladder: Under distended urinary bladder with small volume fluid in the pelvis, small volume fluid in the pelvis seen on prior imaging Other: Small volume ascites in the pelvis as discussed. IMPRESSION: Mild increased cortical echogenicity as can be seen in the setting of medical renal disease. No hydronephrosis. Small volume pelvic ascites. Electronically Signed   By: Donzetta Kohut M.D.   On: 10/03/2020 10:52   DG Chest Port 1 View  Result Date: 10/04/2020 CLINICAL DATA:  Prior right thoracentesis. EXAM: PORTABLE CHEST 1 VIEW COMPARISON:  10/03/2020.  10/02/2020. FINDINGS: AICD noted stable position. Stable cardiomegaly. Bilateral interstitial infiltrates/edema cannot be excluded. Right pleural effusion is stable. Stable loculated left pleural effusion. Left base calcified pleural plaques again noted. No pneumothorax noted on today's exam. IMPRESSION: 1. Stable right pleural effusion. No pneumothorax noted on today's exam. Stable loculated left pleural effusion. 2. AICD noted stable position. Stable cardiomegaly. Bilateral interstitial edema/infiltrates cannot be excluded. Electronically Signed   By: Maisie Fus  Register  On: 10/04/2020 08:24   DG Chest Port 1 View  Result Date: 10/03/2020 CLINICAL DATA:  Status post thoracentesis. EXAM: PORTABLE  CHEST 1 VIEW COMPARISON:  Same day. FINDINGS: Stable cardiomediastinal silhouette. Right pleural effusion is significantly smaller status post thoracentesis. Possible small right apical pneumothorax is noted which most likely represents ex vacuo pneumothorax. Stable right lung opacity is noted. IMPRESSION: Right pleural effusion is significantly smaller status post thoracentesis. Possible small right apical pneumothorax is noted which most likely represents ex vacuo pneumothorax. Stable right lung opacity. Electronically Signed   By: Lupita RaiderJames  Green Jr M.D.   On: 10/03/2020 16:10   DG Chest Port 1 View  Result Date: 10/02/2020 CLINICAL DATA:  Fatigue EXAM: PORTABLE CHEST 1 VIEW COMPARISON:  09/30/2020 FINDINGS: There are persistent bilateral loculated pleural effusions which appear to have increased on the right but are stable on the left. Pleural base calcifications are again noted. There is no pneumothorax. The heart size is stable. Aortic calcifications are noted. There is a biventricular pacemaker in place. There is no acute osseous abnormality. IMPRESSION: Persistent bilateral loculated pleural effusions, increased on the right but stable on the left. Electronically Signed   By: Katherine Mantlehristopher  Green M.D.   On: 10/02/2020 18:43   DG Chest Port 1 View  Result Date: 09/30/2020 CLINICAL DATA:  Status post right-sided thoracentesis. EXAM: PORTABLE CHEST 1 VIEW COMPARISON:  CT a chest in chest x-ray from yesterday. FINDINGS: Unchanged left chest wall pacemaker. Stable cardiomegaly and pulmonary artery enlargement. Loculated bilateral pleural effusions again noted, slightly decreased on the right status post thoracentesis. No pneumothorax. Unchanged pleural calcification at the lung bases and bilateral lower lobe atelectasis. No acute osseous abnormality. IMPRESSION: 1. Loculated bilateral pleural effusions, slightly decreased on the right status post thoracentesis. No pneumothorax. Electronically Signed   By:  Obie DredgeWilliam T Derry M.D.   On: 09/30/2020 12:34   DG Chest Portable 1 View  Result Date: 09/29/2020 CLINICAL DATA:  Dyspnea EXAM: PORTABLE CHEST 1 VIEW COMPARISON:  None. FINDINGS: Bilateral basilar laterally loculated pleural effusions are present, moderate on the right and small on the left. Left basilar pleural calcifications are identified. There is enlargement of the right hilum suggesting presence of right hilar mass or right hilar adenopathy. There is partial right lower lobe collapse. Opacity within the left perihilar region may represent fluid within the left major fissure. A left perihilar mass, however, is difficult to exclude. No pneumothorax. Mild cardiomegaly. Left subclavian 3 lead pacemaker in place. Vascular calcifications are seen within the abdominal aorta. The pulmonary vascularity is normal. No acute bone abnormality. IMPRESSION: Suspected right hilar mass or adenopathy with subtotal collapse of the right lower lobe possibly representing postobstructive collapse. Bilateral loculated pleural effusions, moderate on the right and small on the left. Coarse left basilar pleural calcification may relate to remote trauma or infection. Left perihilar opacity possibly representing fluid within the fissure or a left perihilar focal pulmonary mass. This would be better assessed with dedicated contrast enhanced CT imaging. Mild cardiomegaly. Electronically Signed   By: Helyn NumbersAshesh  Parikh MD   On: 09/29/2020 11:08   ECHOCARDIOGRAM COMPLETE  Result Date: 09/30/2020    ECHOCARDIOGRAM REPORT   Patient Name:   Patrick ScarletFRED Mccormick Date of Exam: 09/30/2020 Medical Rec #:  [id number]       Height:       67.0 in Accession #:    [id number]      Weight:       88.3 lb Date of Birth:  1931/09/27  BSA:          1.429 m Patient Age:    88 years        BP:           87/50 mmHg Patient Gender: M               HR:           76 bpm. Exam Location:  [facility] Procedure: 2D Echo, Color Doppler and Cardiac Doppler Indications:      R06.00 Dyspnea  History:         Patient has no prior history of Echocardiogram examinations.                  Arrythmias:Paroxymal atrial fibrillation,                  Signs/Symptoms:Shortness of Breath; Risk Factors:Hypertension.  Sonographer:     Humphrey Rolls RDCS (AE) Referring Phys:  [id number] AMY N COX Diagnosing Phys: Harold Hedge MD  Sonographer Comments: TDS due to bony thorax. IMPRESSIONS  1. Left ventricular ejection fraction, by estimation, is 65 to 70%. The left ventricle has normal function. The left ventricle has no regional wall motion abnormalities. Left ventricular diastolic parameters were normal.  2. Right ventricular systolic function is normal. The right ventricular size is mildly enlarged.  3. Left atrial size was mildly dilated.  4. Right atrial size was mildly dilated.  5. The mitral valve is grossly normal. Mild to moderate mitral valve regurgitation.  6. The aortic valve is calcified. Aortic valve regurgitation is trivial. Mild to moderate aortic valve sclerosis/calcification is present, without any evidence of aortic stenosis. FINDINGS  Left Ventricle: Left ventricular ejection fraction, by estimation, is 65 to 70%. The left ventricle has normal function. The left ventricle has no regional wall motion abnormalities. The left ventricular internal cavity size was normal in size. There is  borderline left ventricular hypertrophy. Left ventricular diastolic parameters were normal. Right Ventricle: The right ventricular size is mildly enlarged. No increase in right ventricular wall thickness. Right ventricular systolic function is normal. Left Atrium: Left atrial size was mildly dilated. Right Atrium: Right atrial size was mildly dilated. Pericardium: There is no evidence of pericardial effusion. Mitral Valve: The mitral valve is grossly normal. Mild to moderate mitral valve regurgitation. MV peak gradient, 3.1 mmHg. The mean mitral valve gradient is 1.0 mmHg. Tricuspid Valve: The tricuspid valve  is not well visualized. Tricuspid valve regurgitation is trivial. Aortic Valve: The aortic valve is calcified. Aortic valve regurgitation is trivial. Mild to moderate aortic valve sclerosis/calcification is present, without any evidence of aortic stenosis. Aortic valve mean gradient measures 3.0 mmHg. Aortic valve peak  gradient measures 5.9 mmHg. Aortic valve area, by VTI measures 1.71 cm. Pulmonic Valve: The pulmonic valve was not well visualized. Pulmonic valve regurgitation is trivial. Aorta: The aortic root is normal in size and structure. IAS/Shunts: No atrial level shunt detected by color flow Doppler. Additional Comments: A pacer wire is visualized.  LEFT VENTRICLE PLAX 2D LVIDd:         3.50 cm LVIDs:         2.10 cm LV PW:         1.10 cm LV IVS:        0.80 cm LVOT diam:     2.20 cm LV SV:         38 LV SV Index:   26 LVOT Area:     3.80 cm  RIGHT VENTRICLE  RV Basal diam:  4.80 cm LEFT ATRIUM             Index       RIGHT ATRIUM           Index LA diam:        4.10 cm 2.87 cm/m  RA Area:     30.00 cm LA Vol (A2C):   40.4 ml 28.28 ml/m RA Volume:   112.00 ml 78.39 ml/m LA Vol (A4C):   92.3 ml 64.61 ml/m LA Biplane Vol: 65.3 ml 45.71 ml/m  AORTIC VALVE                   PULMONIC VALVE AV Area (Vmax):    1.97 cm    PV Vmax:       0.68 m/s AV Area (Vmean):   1.92 cm    PV Vmean:      42.900 cm/s AV Area (VTI):     1.71 cm    PV VTI:        0.128 m AV Vmax:           121.00 cm/s PV Peak grad:  1.9 mmHg AV Vmean:          79.000 cm/s PV Mean grad:  1.0 mmHg AV VTI:            0.220 m AV Peak Grad:      5.9 mmHg AV Mean Grad:      3.0 mmHg LVOT Vmax:         62.70 cm/s LVOT Vmean:        40.000 cm/s LVOT VTI:          0.099 m LVOT/AV VTI ratio: 0.45  AORTA Ao Root diam: 3.50 cm MITRAL VALVE MV Area (PHT): 6.96 cm    SHUNTS MV Area VTI:   2.36 cm    Systemic VTI:  0.10 m MV Peak grad:  3.1 mmHg    Systemic Diam: 2.20 cm MV Mean grad:  1.0 mmHg MV Vmax:       0.88 m/s MV Vmean:      47.8 cm/s MV Decel  Time: 109 msec MV E velocity: 75.80 cm/s MV A velocity: 36.80 cm/s MV E/A ratio:  2.06 Harold Hedge MD Electronically signed by Harold Hedge MD Signature Date/Time: 09/30/2020/11:36:34 AM    Final    US LIVER DOPPLER  Result Date: 10/02/2020 CLINICAL DATA:  Hepatic cirrhosis EXAM: DUPLEX ULTRASOUND OF LIVER TECHNIQUE: Color and duplex Doppler ultrasound was performed to evaluate the hepatic in-flow and out-flow vessels. COMPARISON:  09/30/2020 CT with contrast FINDINGS: Liver: Increased hepatic echogenicity. No definite contour abnormality or significant surface nodularity. No focal lesion, mass or intrahepatic biliary ductal dilatation. Main Portal Vein size: 0.9 cm Portal Vein Velocities Main Prox:  21 cm/sec Main Mid: 40 cm/sec Main Dist:  36 cm/sec Right: 32 cm/sec Left: 24 cm/sec Hepatic Vein Velocities Right:  51 cm/sec Middle:  58 cm/sec Left:  51 cm/sec IVC: Present and patent with normal respiratory phasicity. Hepatic Artery Velocity:  71 cm/sec Splenic Vein Velocity:  14 cm/sec Spleen: 4 cm x 4 cm x 6 cm with a total volume of 62 cm^3 (411 cm^3 is upper limit normal) Portal Vein Occlusion/Thrombus: No Splenic Vein Occlusion/Thrombus: No Ascites: None Varices: None IMPRESSION: Hepatic veins and IVC are dilated suggesting component of right heart failure. Portal, hepatic and splenic veins are all patent with normal directional flow. No veno-occlusive process. Electronically Signed   By: Judie Petit.  Shick M.D.   On: 10/02/2020 11:19   US THORACENTESIS ASP PLEURAL SPACE W/IMG GUIDE  Result Date: 10/04/2020 INDICATION: Patient with history of pulmonary hypertension and persistent AFib. Found to have a recurrent right-sided pleural effusion. Team is requesting therapeutic and diagnostic thoracentesis EXAM: ULTRASOUND GUIDED THERAPEUTIC AND DIAGNOSTIC THORACENTESIS MEDICATIONS: Lidocaine 1% 10 mL COMPLICATIONS: None immediate. PROCEDURE: An ultrasound guided thoracentesis was thoroughly discussed with the patient  and questions answered. The benefits, risks, alternatives and complications were also discussed. The patient understands and wishes to proceed with the procedure. Written consent was obtained. Ultrasound was performed to localize and mark an adequate pocket of fluid in the right chest. The area was then prepped and draped in the normal sterile fashion. 1% Lidocaine was used for local anesthesia. Under ultrasound guidance a 6 Fr Safe-T-Centesis catheter was introduced. Thoracentesis was performed. The catheter was removed and a dressing applied. FINDINGS: A total of approximately 350 mL of amber colored fluid was removed. Samples were sent to the laboratory as requested by the clinical team. IMPRESSION: Successful ultrasound guided therapeutic and diagnostic right sided thoracentesis yielding 350 mL of pleural fluid. Read by: Anders Grant, NP Electronically Signed   By: Acquanetta Belling M.D.   On: 10/03/2020 16:35   US THORACENTESIS ASP PLEURAL SPACE W/IMG GUIDE  Result Date: 09/30/2020 INDICATION: Patient with history of pulmonary hypertension and persistent AFib. Found to have bilateral pleural effusions after presenting to the emergency department with shortness of breath. Team is requesting therapeutic and diagnostic thoracentesis EXAM: ULTRASOUND GUIDED RIGHT-SIDED THERAPEUTIC AND DIAGNOSTIC THORACENTESIS MEDICATIONS: Lidocaine 1% 10 mL COMPLICATIONS: None immediate. PROCEDURE: An ultrasound guided thoracentesis was thoroughly discussed with the patient and questions answered. The benefits, risks, alternatives and complications were also discussed. The patient understands and wishes to proceed with the procedure. Written consent was obtained. Ultrasound was performed to localize and mark an adequate pocket of fluid in the right chest. The area was then prepped and draped in the normal sterile fashion. 1% Lidocaine was used for local anesthesia. Under ultrasound guidance a 6 Fr Safe-T-Centesis catheter was  introduced. Thoracentesis was performed. The catheter was removed and a dressing applied. FINDINGS: A total of approximately 350 mL of amber colored fluid was removed. Samples were sent to the laboratory as requested by the clinical team. Patient unable to tolerate additional fluid removal and per patient request the procedure was terminated. IMPRESSION: Successful ultrasound guided therapeutic and diagnostic right-sided thoracentesis yielding 350 mL of pleural fluid. Read by: Anders Grant, NP Electronically Signed   By: Simonne Come M.D.   On: 09/30/2020 12:54    ECHOCARDIOGRAM REPORT       Patient Name:  Patrick Mccormick Date of Exam: 09/30/2020  Medical Rec #: [id number]    Height:    67.0 in  Accession #:  [id number]   Weight:    88.3 lb  Date of Birth: [birth year]    BSA:     1.429 m  Patient Age:  88 years    BP:      87/50 mmHg  Patient Gender: M        HR:      76 bpm.  Exam Location: [facility]   Procedure: 2D Echo, Color Doppler and Cardiac Doppler   Indications:   R06.00 Dyspnea    History:     Patient has no prior history of Echocardiogram  examinations.          Arrythmias:Paroxymal atrial fibrillation,  Signs/Symptoms:Shortness of Breath; Risk  Factors:Hypertension.    Sonographer:   Humphrey Rolls RDCS (AE)  Referring Phys: [id number] AMY N COX  Diagnosing Phys: Harold Hedge MD     Sonographer Comments: TDS due to bony thorax.  IMPRESSIONS    1. Left ventricular ejection fraction, by estimation, is 65 to 70%. The  left ventricle has normal function. The left ventricle has no regional  wall motion abnormalities. Left ventricular diastolic parameters were  normal.  2. Right ventricular systolic function is normal. The right ventricular  size is mildly enlarged.  3. Left atrial size was mildly dilated.  4. Right atrial size was mildly dilated.  5. The mitral valve is grossly normal.  Mild to moderate mitral valve  regurgitation.  6. The aortic valve is calcified. Aortic valve regurgitation is trivial.  Mild to moderate aortic valve sclerosis/calcification is present, without  any evidence of aortic stenosis.   FINDINGS  Left Ventricle: Left ventricular ejection fraction, by estimation, is 65  to 70%. The left ventricle has normal function. The left ventricle has no  regional wall motion abnormalities. The left ventricular internal cavity  size was normal in size. There is  borderline left ventricular hypertrophy. Left ventricular diastolic  parameters were normal.   Right Ventricle: The right ventricular size is mildly enlarged. No  increase in right ventricular wall thickness. Right ventricular systolic  function is normal.   Left Atrium: Left atrial size was mildly dilated.   Right Atrium: Right atrial size was mildly dilated.   Pericardium: There is no evidence of pericardial effusion.   Mitral Valve: The mitral valve is grossly normal. Mild to moderate mitral  valve regurgitation. MV peak gradient, 3.1 mmHg. The mean mitral valve  gradient is 1.0 mmHg.   Tricuspid Valve: The tricuspid valve is not well visualized. Tricuspid  valve regurgitation is trivial.   Aortic Valve: The aortic valve is calcified. Aortic valve regurgitation is  trivial. Mild to moderate aortic valve sclerosis/calcification is present,  without any evidence of aortic stenosis. Aortic valve mean gradient  measures 3.0 mmHg. Aortic valve peak  gradient measures 5.9 mmHg. Aortic valve area, by VTI measures 1.71 cm.   Pulmonic Valve: The pulmonic valve was not well visualized. Pulmonic valve  regurgitation is trivial.   Aorta: The aortic root is normal in size and structure.   IAS/Shunts: No atrial level shunt detected by color flow Doppler.   Additional Comments: A pacer wire is visualized.     LEFT VENTRICLE  PLAX 2D  LVIDd:     3.50 cm  LVIDs:     2.10 cm   LV PW:     1.10 cm  LV IVS:    0.80 cm  LVOT diam:   2.20 cm  LV SV:     38  LV SV Index:  26  LVOT Area:   3.80 cm     RIGHT VENTRICLE  RV Basal diam: 4.80 cm   LEFT ATRIUM       Index    RIGHT ATRIUM      Index  LA diam:    4.10 cm 2.87 cm/m RA Area:   30.00 cm  LA Vol (A2C):  40.4 ml 28.28 ml/m RA Volume:  112.00 ml 78.39 ml/m  LA Vol (A4C):  92.3 ml 64.61 ml/m  LA Biplane Vol: 65.3 ml 45.71 ml/m  AORTIC VALVE          PULMONIC VALVE  AV Area (Vmax):  1.97 cm  PV Vmax:  0.68 m/s  AV Area (Vmean):  1.92 cm  PV Vmean:   42.900 cm/s  AV Area (VTI):   1.71 cm  PV VTI:    0.128 m  AV Vmax:      121.00 cm/s PV Peak grad: 1.9 mmHg  AV Vmean:     79.000 cm/s PV Mean grad: 1.0 mmHg  AV VTI:      0.220 m  AV Peak Grad:   5.9 mmHg  AV Mean Grad:   3.0 mmHg  LVOT Vmax:     62.70 cm/s  LVOT Vmean:    40.000 cm/s  LVOT VTI:     0.099 m  LVOT/AV VTI ratio: 0.45    AORTA  Ao Root diam: 3.50 cm   MITRAL VALVE  MV Area (PHT): 6.96 cm  SHUNTS  MV Area VTI:  2.36 cm  Systemic VTI: 0.10 m  MV Peak grad: 3.1 mmHg  Systemic Diam: 2.20 cm  MV Mean grad: 1.0 mmHg  MV Vmax:    0.88 m/s  MV Vmean:   47.8 cm/s  MV Decel Time: 109 msec  MV E velocity: 75.80 cm/s  MV A velocity: 36.80 cm/s  MV E/A ratio: 2.06   Harold Hedge MD  Electronically signed by Harold Hedge MD  Signature Date/Time: 09/30/2020/11:36:34 AM       ASSESSMENT/PLAN    Chronic pulmonary effusions -Patient is not in heart failure per most recent TTE but he does have chronic cor pulmonale and anasarca per CT -US liver - RV failure -he has mild aki today buy in general his GFR is within reference range when corrected for age.  -Nephrology on case appreciate input - midodrine , lasix fluid balance -thus far pleural fluid showing borderline exudate lymphocyte predominant which  likely reflects chronic effusion.  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    Patient/Family are satisfied with care plan and all questions have been answered.  This document was prepared using Dragon voice recognition software and may include unintentional dictation errors.     Vida Rigger, M.D.  Division of Pulmonary & Critical Care Medicine  [facility]

## 2020-10-06 NOTE — Progress Notes (Signed)
Calorie Count Day One   Estimated Nutritional Needs:   Kcal:  1600-1800kcal/day Protein:  80-90g/day Fluid:  1.2-1.4L/day  Lunch 12/16: 40% scrambled eggs, bacon and french toast   Total- 192kcal and 8g/day protein   Dinner 12/16: 25% scrambled eggs, bacon and french toast, 100% Magic Cup  Total- 510kcal and 14g/day protein   Breakfast 12/17: 90% pancakes, oatmeal, applesauce and scrambled eggs   Total- 637kcal and 24g/day protein   Total Intake:  1339kcal (85% estimated needs) and 46g protein (58% estimated needs)  Betsey Holiday MS, RD, LDN Please refer to [facility] for RD and/or RD on-call/weekend/after hours pager

## 2020-10-06 NOTE — Progress Notes (Signed)
Pt refused medications at bedtime med pass. This nurse was setting pt in bed and he stated firmly "No pills" Pt educated on risks and benefits and each pill was described to pt so he could have the opportunity to take any meds he felt necessary. Pt refused Ensure as well.

## 2020-10-06 NOTE — Progress Notes (Signed)
Educated the dtr Meg on the medication Samsca. She stated she understood.

## 2020-10-06 NOTE — TOC Initial Note (Signed)
Transition of Care [facility]) - Initial/Assessment Note    Patient Details  Name: Patrick Mccormick MRN: [id number] Date of Birth: 12-06-1931  Transition of Care [facility]) CM/SW Contact:    Hetty Ely, RN Phone Number: 10/06/2020, 8:50 AM  Clinical Narrative:   Assessment done with wife by phone. Wife states patient was able to perform ADL's independently, however she provided transportation to appointments. Wife states she did all the shopping, cooking and [facility] [address]. Uses walker sometime at home. Just moved to [facility] in September continues to see PCP, Dr. Frances Furbish in [facility]. Discussed SNF for short-term rehab. Wife did not want to discuss, states she had to hang up the phone.               Expected Discharge Plan: Skilled Nursing Facility (Per PT evaluation, wife not in agreement at this time.) Barriers to Discharge: Continued Medical Work up   Patient Goals and CMS Choice Patient states their goals for this hospitalization and ongoing recovery are:: Wife not wanting to discuss at this time.   Choice offered to / list presented to : NA  Expected Discharge Plan and Services Expected Discharge Plan: Skilled Nursing Facility (Per PT evaluation, wife not in agreement at this time.) In-house Referral: NA Discharge Planning Services: NA Post Acute Care Choice: NA Living arrangements for the past 2 months: Single Family Home                 DME Arranged: N/A DME Agency: NA       HH Arranged: NA HH Agency: NA        Prior Living Arrangements/Services Living arrangements for the past 2 months: Single Family Home Lives with:: Spouse   Do you feel safe going back to the place where you live?:  (Assessment done with wife not patient.)      Need for Family Participation in Patient Care: Yes (Comment) Care giver support system in place?: Yes (comment)   Criminal Activity/Legal Involvement Pertinent to Current Situation/Hospitalization: No  - Comment as needed  Activities of Daily Living Home Assistive Devices/Equipment: Walker (specify type),Eyeglasses,Hearing aid,Shower chair with back (rolling walker) ADL Screening (condition at time of admission) Patient's cognitive ability adequate to safely complete daily activities?: Yes Is the patient deaf or have difficulty hearing?: Yes Does the patient have difficulty seeing, even when wearing glasses/contacts?: No Does the patient have difficulty concentrating, remembering, or making decisions?: Yes Patient able to express need for assistance with ADLs?: Yes Does the patient have difficulty dressing or bathing?: Yes Independently performs ADLs?: No Communication: Independent Dressing (OT): Needs assistance Is this a change from baseline?: Pre-admission baseline Grooming: Independent Feeding: Independent Bathing: Needs assistance Is this a change from baseline?: Pre-admission baseline Toileting: Needs assistance Is this a change from baseline?: Pre-admission baseline In/Out Bed: Independent with device (comment) Walks in Home: Independent with device (comment) Does the patient have difficulty walking or climbing stairs?: Yes Weakness of Legs: Both Weakness of Arms/Hands: Both  Permission Sought/Granted                  Emotional Assessment         Alcohol / Substance Use: Not Applicable Psych Involvement: No (comment)  Admission diagnosis:  Shortness of breath [R06.02] Hyponatremia [E87.1] Pleural effusion [J90] Pleural effusion due to congestive heart failure (HCC) [I50.9] Acute respiratory failure with hypoxia (HCC) [J96.01] Patient Active Problem List   Diagnosis Date Noted  . Protein-calorie malnutrition, severe 09/30/2020  .  Pleural effusion due to congestive heart failure (HCC) 09/29/2020  . Pulmonary hypertension (HCC) 09/29/2020  . Atrial fibrillation, chronic (HCC) 09/29/2020  . History of partial gastrectomy 09/29/2020  . Respiratory distress  09/29/2020   PCP:  Conan Bowens., MD [phone] [address], [place] - [address] [place] [address] Phone: [phone] Fax: [phone]     Social Determinants of Health (SDOH) Interventions    Readmission Risk Interventions Readmission Risk Prevention Plan 10/06/2020  Transportation Screening Complete  PCP or Specialist Appt within 5-7 Days Not Complete  Not Complete comments Will be determined at discharge.  Home Care Screening Complete  Medication Review (RN CM) Complete

## 2020-10-07 LAB — BASIC METABOLIC PANEL
Anion gap: 11 (ref 5–15)
BUN: 148 mg/dL — ABNORMAL HIGH (ref 8–23)
CO2: 21 mmol/L — ABNORMAL LOW (ref 22–32)
Calcium: 9.8 mg/dL (ref 8.9–10.3)
Chloride: 89 mmol/L — ABNORMAL LOW (ref 98–111)
Creatinine, Ser: 5.06 mg/dL — ABNORMAL HIGH (ref 0.61–1.24)
GFR, Estimated: 10 mL/min — ABNORMAL LOW (ref >60)
Glucose, Bld: 117 mg/dL — ABNORMAL HIGH (ref 70–99)
Potassium: 5.2 mmol/L — ABNORMAL HIGH (ref 3.5–5.1)
Sodium: 121 mmol/L — ABNORMAL LOW (ref 135–145)

## 2020-10-07 LAB — PROTEIN ELECTROPHORESIS, SERUM
A/G Ratio: 1.2 (ref 0.7–1.7)
Albumin ELP: 3.3 g/dL (ref 2.9–4.4)
Alpha-1-Globulin: 0.3 g/dL (ref 0.0–0.4)
Alpha-2-Globulin: 0.6 g/dL (ref 0.4–1.0)
Beta Globulin: 0.8 g/dL (ref 0.7–1.3)
Gamma Globulin: 1.1 g/dL (ref 0.4–1.8)
Globulin, Total: 2.8 g/dL (ref 2.2–3.9)
Total Protein ELP: 6.1 g/dL (ref 6.0–8.5)

## 2020-10-07 LAB — BODY FLUID CULTURE W GRAM STAIN
Culture: NO GROWTH
Gram Stain: NONE SEEN

## 2020-10-07 MED ORDER — APIXABAN 5 MG PO TABS: 5.0000 mg | ORAL_TABLET | Freq: Two times a day (BID) | ORAL | 1 refills | Status: AC

## 2020-10-07 MED ORDER — LEVOTHYROXINE SODIUM 25 MCG PO TABS: 25.0000 ug | ORAL_TABLET | Freq: Every day | ORAL | 1 refills | Status: AC

## 2020-10-07 MED ORDER — MORPHINE SULFATE (CONCENTRATE) 10 MG/0.5ML PO SOLN
5.0000 mg | ORAL | 0 refills | Status: AC | PRN
Start: 2020-10-07 — End: ?

## 2020-10-07 NOTE — Progress Notes (Signed)
[facility] [place], [place] 10/07/20  Subjective:   Shlok Raz is a 87 y.o. male with PMHX of pulmonary HTN, HTN, hx of SIADH, persistent atrial fibrillation, iron deficiency anemia and gastrectomy. He presented to the ED with complaints of shortness of breath.   He was admitted with respiratory failure including pleural effusion vs pulmonary embolism vs heart failure exacerbation.   Patient seen resting quietly in bed Nonrespondent to name  Eyes open, no response     Objective:  Vital signs in last 24 hours:  Temp:  [97.4 F (36.3 C)-98 F (36.7 C)] 98 F (36.7 C) (02/18 0807) Pulse Rate:  [65-69] 67 (02/18 0807) Resp:  [16-18] 16 (02/18 0807) BP: (100-122)/(61-76) 122/72 (02/18 0807) SpO2:  [94 %-100 %] 97 % (02/18 0807) Weight:  [42.7 kg] 42.7 kg (02/18 0358)  Weight change: 1.4 kg Filed Weights   10/05/20 0405 10/06/20 0500 10/07/20 0358  Weight: 39.9 kg 41.3 kg 42.7 kg    Intake/Output:    Intake/Output Summary (Last 24 hours) at 10/07/2020 1149 Last data filed at 10/07/2020 0956 Gross per 24 hour  Intake 0 ml  Output 250 ml  Net -250 ml   Physical Exam: General: Ill-appearing Laying in bed  Head: Normocephalic, atraumatic.  moist oral mucosal   Eyes: Anicteric  Neck: Supple, trachea midline  Lungs:  Bilateral rhonchi  On 2 L [place] O2  Heart: Regular rate and rhythm  Abdomen:  Soft, nontender,   Extremities: +1 peripheral edema.  Neurologic: Nonfocal, moving all four extremities  Skin: No lesions    Basic Metabolic Panel:  Recent Labs  Lab 10/03/20 0409 10/04/20 0502 10/04/20 1353 10/05/20 0651 10/05/20 1445 10/05/20 2206 10/06/20 0545 10/06/20 0713  NA 120* 123*  --  123* 121* 122* 121* 123*  K 5.2* 5.6* 5.3* 5.6* 5.0  --  4.8  --   CL 89* 88*  --  87* 86*  --  87*  --   CO2 22 25  --  23 25  --  24  --   GLUCOSE 133* 122*  --  140* 139*  --  128*  --   BUN 70* 87*  --  101* 104*  --  121*  --   CREATININE 3.02*  3.77*  --  4.44* 4.60*  --  4.79*  --   CALCIUM 9.5 10.2  --  10.3 10.1  --  10.1  --      CBC: Recent Labs  Lab 10/01/20 0432 10/02/20 0444 10/03/20 0409 10/06/20 0545  WBC 4.7 5.2 7.7 8.0  HGB 9.8* 9.0* 9.6* 10.5*  HCT 28.8* 25.9* 27.4* 30.1*  MCV 94.4 94.9 94.5 95.3  PLT 130* 134* 148* 204     No results found for: HEPBSAG, HEPBSAB, HEPBIGM    Microbiology:  Recent Results (from the past 240 hour(s))  Resp Panel by RT-PCR (Flu A&B, Covid) Nasopharyngeal Swab     Status: None   Collection Time: 09/29/20 11:02 AM   Specimen: Nasopharyngeal Swab; Nasopharyngeal(NP) swabs in vial transport medium  Result Value Ref Range Status   SARS Coronavirus 2 by RT PCR NEGATIVE NEGATIVE Final    Comment: (NOTE) SARS-CoV-2 target nucleic acids are NOT DETECTED.  The SARS-CoV-2 RNA is generally detectable in upper respiratory specimens during the acute phase of infection. The lowest concentration of SARS-CoV-2 viral copies this assay can detect is 138 copies/mL. A negative result does not preclude SARS-Cov-2 infection and should not be used as the sole basis for  treatment or other patient management decisions. A negative result may occur with  improper specimen collection/handling, submission of specimen other than nasopharyngeal swab, presence of viral mutation(s) within the areas targeted by this assay, and inadequate number of viral copies(<138 copies/mL). A negative result must be combined with clinical observations, patient history, and epidemiological information. The expected result is Negative.  Fact Sheet for Patients:  BloggerCourse.com  Fact Sheet for Healthcare Providers:  SeriousBroker.it  This test is no t yet approved or cleared by the [place] FDA and  has been authorized for detection and/or diagnosis of SARS-CoV-2 by FDA under an Emergency Use Authorization (EUA). This EUA will remain  in effect (meaning  this test can be used) for the duration of the COVID-19 declaration under Section 564(b)(1) of the Act, 21 U.S.C.section 360bbb-3(b)(1), unless the authorization is terminated  or revoked sooner.       Influenza A by PCR NEGATIVE NEGATIVE Final   Influenza B by PCR NEGATIVE NEGATIVE Final    Comment: (NOTE) The Xpert Xpress SARS-CoV-2/FLU/RSV plus assay is intended as an aid in the diagnosis of influenza from Nasopharyngeal swab specimens and should not be used as a sole basis for treatment. Nasal washings and aspirates are unacceptable for Xpert Xpress SARS-CoV-2/FLU/RSV testing.  Fact Sheet for Patients: BloggerCourse.com  Fact Sheet for Healthcare Providers: SeriousBroker.it  This test is not yet approved or cleared by the [place] FDA and has been authorized for detection and/or diagnosis of SARS-CoV-2 by FDA under an Emergency Use Authorization (EUA). This EUA will remain in effect (meaning this test can be used) for the duration of the COVID-19 declaration under Section 564(b)(1) of the Act, 21 U.S.C. section 360bbb-3(b)(1), unless the authorization is terminated or revoked.  Performed at [facility], [address] [place], [place] [address]   MRSA PCR Screening     Status: None   Collection Time: 09/29/20  6:41 PM   Specimen: Nasopharyngeal  Result Value Ref Range Status   MRSA by PCR NEGATIVE NEGATIVE Final    Comment:        The GeneXpert MRSA Assay (FDA approved for NASAL specimens only), is one component of a comprehensive MRSA colonization surveillance program. It is not intended to diagnose MRSA infection nor to guide or monitor treatment for MRSA infections. Performed at [facility], [address] [place], [place] [address]   Body fluid culture     Status: None   Collection Time: 09/30/20 12:00 PM   Specimen: PATH Cytology Pleural fluid  Result Value Ref Range Status    Specimen Description   Final    PLEURAL Performed at [facility], [address] [place], [place] [address]    Special Requests   Final    NONE Performed at [facility], [address] [place], [place] [address]    Gram Stain NO WBC SEEN NO ORGANISMS SEEN   Final   Culture   Final    NO GROWTH 3 DAYS Performed at [facility], [address], [place], [place] [address]    Report Status 10/03/2020 FINAL  Final  Fungus Culture With Stain     Status: None (Preliminary result)   Collection Time: 09/30/20 12:00 PM   Specimen: PATH Cytology Pleural fluid  Result Value Ref Range Status   Fungus Stain Final report  Final    Comment: (NOTE) Performed At: [facility] [address] [place], [place] [id number] Jolene Schimke MD [id number]  Fungus (Mycology) Culture PENDING  Incomplete   Fungal Source PLEURAL  Final    Comment: Performed at [facility], [address] [place], [place] [address]  Acid Fast Smear (AFB)     Status: None   Collection Time: 09/30/20 12:00 PM   Specimen: PATH Cytology Pleural fluid  Result Value Ref Range Status   AFB Specimen Processing Concentration  Final   Acid Fast Smear Negative  Final    Comment: (NOTE) Performed At: [facility] [address] [place], [place] [id number] Jolene Schimke MD YI:[id number]    Source (AFB) PLEURAL  Final    Comment: Performed at [facility], [address] [place], [place] [address]  Fungus Culture Result     Status: None   Collection Time: 09/30/20 12:00 PM  Result Value Ref Range Status   Result 1 Comment  Final    Comment: (NOTE) KOH/Calcofluor preparation:  no fungus observed. Performed At: [facility] [address] [place], [place] [id number] Jolene Schimke MD EZ:[id number]   Fungus Culture With Stain     Status: None (Preliminary result)   Collection Time: 10/03/20  3:40 PM   Specimen: PATH Cytology Pleural fluid   Result Value Ref Range Status   Fungus Stain Final report  Final    Comment: (NOTE) Performed At: [facility] [address] [place], [place] [id number] Jolene Schimke MD [id number]    Fungus (Mycology) Culture PENDING  Incomplete   Fungal Source PLEURAL  Final    Comment: Performed at [facility], [address] [place], [place] [address]  Body fluid culture w Gram Stain     Status: None   Collection Time: 10/03/20  3:40 PM   Specimen: PATH Cytology Pleural fluid  Result Value Ref Range Status   Specimen Description   Final    PLEURAL Performed at [facility], [address] [place], [place] [address]    Special Requests   Final    PLEURAL Performed at [facility], [address] [place], [place] [address]    Gram Stain NO WBC SEEN NO ORGANISMS SEEN   Final   Culture   Final    NO GROWTH Performed at [facility], [address] [place], [place] [address]    Report Status 10/07/2020 FINAL  Final  Fungus Culture Result     Status: None   Collection Time: 10/03/20  3:40 PM  Result Value Ref Range Status   Result 1 Comment  Final    Comment: (NOTE) KOH/Calcofluor preparation:  no fungus observed. Performed At: [facility] [address] [place], [place] [id number] Jolene Schimke MD ES:[id number]     Coagulation Studies: No results for input(s): LABPROT, INR in the last 72 hours.  Urinalysis: Recent Labs    10/04/20 2210  COLORURINE AMBER*  LABSPEC 1.024  PHURINE 5.0  GLUCOSEU NEGATIVE  HGBUR NEGATIVE  BILIRUBINUR NEGATIVE  KETONESUR NEGATIVE  PROTEINUR 30*  NITRITE NEGATIVE  LEUKOCYTESUR NEGATIVE      Imaging: No results found.   Medications:    . apixaban  5 mg Oral BID  . vitamin C  250 mg Oral BID  . feeding supplement  237 mL Oral TID BM  . levothyroxine  25 mcg Oral Q0600  . mirtazapine  7.5 mg Oral QHS  . multivitamin with minerals  1 tablet Oral Daily   morphine  CONCENTRATE **OR** morphine CONCENTRATE, ondansetron **OR** ondansetron (ZOFRAN) IV, sodium chloride  Assessment/ Plan:  88  y.o. male with PMHX of pulmonary HTN, HTN, hx of SIADH, persistent atrial fibrillation, iron deficiency anemia and gastrectomy. was admitted on 09/29/2020   Principal Problem:   Respiratory distress Active Problems:   Pleural effusion due to congestive heart failure (HCC)   Pulmonary hypertension (HCC)   Atrial fibrillation, chronic (HCC)   History of partial gastrectomy   Protein-calorie malnutrition, severe  Shortness of breath [R06.02] Hyponatremia [E87.1] Pleural effusion [J90] Pleural effusion due to congestive heart failure (HCC) [I50.9] Acute respiratory failure with hypoxia (HCC) [J96.01]  #. Acute kidney injury on CKD stage 3B -baseline creatinine 1.48 on 06/22/20. -No obstruction on CT abd/pelvis -Current level -4.79 -family meeting yesterday-decided on home hospice. Patient would not want aggressive measure. -appreciate palliative involvement  #. Hyponatremia -Baseline appears to be 126-135 -Sodium at baseline -positive history of SIADH and hyponatremia -based on above plan, medications reduced to minimal -D/c Tolvaptan and Lokelma  #. Anemia of Chronic kidney disease  Lab Results  Component Value Date   HGB 10.5 (L) 10/06/2020    Continues to improve  # Hypotension -d/c midodrine   LOS: 7 Wendee BeaversShantelle Breeze, NP 2/18/202211:49 AM  [facility] [place], [place] [phone]

## 2020-10-07 NOTE — TOC Transition Note (Signed)
Transition of Care [facility]) - CM/SW Discharge Note   Patient Details  Name: Patrick Mccormick MRN: [id number] Date of Birth: [birth year]  [facility] CM/SW Contact:  Hetty Ely, RN Phone Number: 10/07/2020, 12:49 PM   Clinical Narrative: Patient to be discharged home with [facility]. Family consulted voices understanding. [facility] arranged equipment to be delivered to home by 4pm today.    Final next level of care: Home w Hospice Care Barriers to Discharge: ED No Barriers   Patient Goals and CMS Choice Patient states their goals for this hospitalization and ongoing recovery are:: Family decided for patient to return home with [facility].   Choice offered to / list presented to : NA  Discharge Placement                Patient to be transferred to facility by: [facility] Name of family member notified: Daughter in room Patient and family notified of of transfer: 10/07/20  Discharge Plan and Services In-house Referral: NA Discharge Planning Services: NA Post Acute Care Choice: NA          DME Arranged: N/A DME Agency: Other - Comment ([facility]/Hospice) Date DME Agency Contacted: 10/07/20 Time DME Agency Contacted: 1000 Representative spoke with at DME Agency: Boyd Kerbs HH Arranged: NA HH Agency: NA        Social Determinants of Health (SDOH) Interventions     Readmission Risk Interventions Readmission Risk Prevention Plan 10/06/2020  Transportation Screening Complete  PCP or Specialist Appt within 5-7 Days Not Complete  Not Complete comments Will be determined at discharge.  Home Care Screening Complete  Medication Review (RN CM) Complete

## 2020-10-07 NOTE — Discharge Summary (Signed)
Physician Discharge Summary  Keefer Soulliere WLN:[id number] DOB: 1932-05-19 DOA: 09/29/2020  PCP: Janace Litten., MD  Admit date: 09/29/2020 Discharge date: 10/07/2020  Admitted From: Home  Disposition:  Home with Hospice    Equipment/Devices: Hospital bed  Discharge Condition: End of life in days to weeks  CODE STATUS: No CPR Diet recommendation: As able  Brief/Interim Summary: Mr. Alper is a 87 y.o. M with [place] on sildenafil with [facility], A. 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  In the ER, he was hypoxic to the 80s.  CTA showed PE and so he was started on heparin.      PRINCIPAL HOSPITAL DIAGNOSIS: Acute hypoxic respiratory failure due to malnutrition in the setting of pulmonary embolism, pleural effusions, pulmonary hypertension    Discharge Diagnoses:   Acute hypoxic respiratory failure in the setting of worsening pleural effusion, chronic PE, and known pulmonary hypertension with right heart failure Patient presented with dyspnea and hypoxia to 80s.    He was admitted, CTA showed some subacute pulmonary emboli and heparin was started.    Given his pleural effusions, he underwent thoracentesis x2.  These were transudative, about 700cc total.  No malignant cells.       Weight loss Severe protein calorie malnutrition His lung failure did also appear in part to be exacerbated by asthenia due to malnourishment.    Appears to have begun over the last 12-16 months, after his gastrectomy for perforated gastric ulcer in 2020.  4/19 - 71kg 11/19 - 75 kg 1/20 - 69 kg 4/20 - 67 kg 01/04/19 - Gastrectomy (details from this Op Note are at the bottom of this note) 10/20 - 74 kg 1/21 - 70 kg 2/21 - 65 kg 3/21 - 61 kg 4/21 -  54 kg 6/21 - 53 kg 9/21 - 48 kg 11/21 - 47 kg Now - 40 kg  The patient was uptodate with cancer screenings and CT of the chest, abdomen and pelvis showed no evidence of malignancy.  No vomiting, no bloating or indigestion.  Copper and B12 levels normal. B12 replete. Albumin actually normal.       Acute renal failure on CKD IIIa While in the hospital, patient developed renal failure. This appeared to be ischemic in nature, as a result of poor PO intake and hypotension.    Despite treatment, his creatinine continued to worsen.  Nephrology and I met with family to discuss options for aggressive treatment including initiation of dialysis given his encephalopathy and placement of a feeding tube for artificial nutrition.  This course of action would require a prolonged hospitalization, with an uncertain likelihood of recovery of kidney function, improvement in mentation enough to enjoy life, or his ability to tolerate dialysis are unclear.  Furthermore, the patient had already voiced that he did NOT want a long term feeding tube, despite that malnourishment was the primary driving cause of his illness, and treatment of this he has now demonstrated would almost certainly require placement of a long-term feeding tube.  In light of this, patient's family are clear that his wishes would have been to go home, not to initiate dialysis and to pursue Hospice/comfort measures at home.    Hypotension Hyperkalemia Hyponatremia Acute metabolic encephalopathy Pulmonary hypertension Chronic atrial fibrillation Sick sinus syndrome  with pacemaker Gout Hyperlipidemia Hypothyroidism Anemia of chronic disease, normocytic  Transition to Hospice  Reasonable to continue sildenafil, apixaban, and levothyroxine for comfort             Discharge Instructions  Discharge Instructions    Discharge instructions   Complete by: As directed    From Dr. Loleta Books: Work with Hospice to decide if  you will continue sildenafil, mirtazapine, levothyroxine and apixaban. I have sent new prescriptions for levothyroxine and apixaban to [facility].  I have also sent over prescriptions for morphine to be given as well. This can be given orally (swallowed) or sublingually (just deposited under the tongue) if your father is in pain  The dose can be 5 or 10 mg at a time, or as directed by Hospice   Increase activity slowly   Complete by: As directed      Allergies as of 10/07/2020   No Known Allergies     Medication List    STOP taking these medications   allopurinol 100 MG tablet Commonly known as: ZYLOPRIM   atenolol 50 MG tablet Commonly known as: TENORMIN   atorvastatin 20 MG tablet Commonly known as: LIPITOR   furosemide 20 MG tablet Commonly known as: LASIX     TAKE these medications   apixaban 5 MG Tabs tablet Commonly known as: ELIQUIS Take 1 tablet (5 mg total) by mouth 2 (two) times daily. What changed:   medication strength  how much to take  when to take this   levothyroxine 25 MCG tablet Commonly known as: SYNTHROID Take 1 tablet (25 mcg total) by mouth daily at 6 (six) AM.   mirtazapine 7.5 MG tablet Commonly known as: REMERON Take 7.5 mg by mouth at bedtime.   morphine CONCENTRATE 10 MG/0.5ML Soln concentrated solution Take 0.25-0.5 mLs (5-10 mg total) by mouth every 2 (two) hours as needed for moderate pain (or dyspnea).   sildenafil 20 MG tablet Commonly known as: REVATIO Take 20 mg by mouth 3 (three) times daily.       No Known Allergies  Consultations:  Nephrology  Cardiology  Pulmonology  Palliative Care   Procedures/Studies: DG Chest 2 View  Result Date: 10/03/2020 CLINICAL DATA:  Acute renal injury EXAM: CHEST - 2 VIEW COMPARISON:  10/02/2020 FINDINGS: LEFT-sided pacemaker overlies stable cardiac silhouette. Large bilateral loculated pleural effusions. The LEFT effusion appears slightly decreased. No pneumothorax.  Calcified pleural plaque over the LEFT hemidiaphragm. IMPRESSION: 1. Bilateral large loculated pleural effusions. 2. LEFT effusion may be slightly reduced. 3. No pulmonary edema or pneumothorax. Electronically Signed   By: Suzy Bouchard M.D.   On: 10/03/2020 10:23   CT Angio Chest PE W and/or Wo Contrast  Addendum Date: 09/29/2020   ADDENDUM REPORT: 09/29/2020 15:35 ADDENDUM: These results were called by telephone at the time of interpretation on 09/29/2020 at 3:35 pm to provider AMY COX , who verbally acknowledged these results. Electronically Signed   By: Lovena Le M.D.   On: 09/29/2020 15:35   Result Date: 09/29/2020 CLINICAL DATA:  Dyspnea EXAM: CT ANGIOGRAPHY CHEST WITH CONTRAST TECHNIQUE: Multidetector CT imaging of the chest was performed using the standard protocol during bolus administration of intravenous contrast. Multiplanar CT image reconstructions and MIPs were obtained to evaluate the vascular anatomy. CONTRAST:  19m OMNIPAQUE IOHEXOL 350 MG/ML SOLN COMPARISON:  Radiograph 09/29/2020 FINDINGS: Cardiovascular: Satisfactory opacification of pulmonary arteries. Some thin linear web-like bands of hypoattenuation in the distal left main pulmonary artery and lingular branches. Some more  anti dependent hypoattenuation within the segmental pulmonary arteries of the left lower lobe have an appearance favored to be laminar flow and mixing artifact though a more subacute to remote pulmonary embolism is not fully excluded. Central pulmonary arteries are enlarged. There is marked cardiomegaly with four-chamber enlargement. Three-vessel coronary artery atherosclerosis is present. Left chest wall pacer pack is noted with leads at the right atrium, coronary sinus and cardiac apex. Trace pericardial effusion. Suboptimal opacification of the aorta for luminal assessment. Atherosclerotic plaque within the normal caliber aorta. Normal 3 vessel branching of the aortic arch. Reflux of contrast in the hepatic  veins and IVC. Mediastinum/Nodes: Diffuse edematous changes throughout the mediastinum. Scattered low-attenuation mediastinal and hilar lymph nodes favored to be reactive/edematous. No concerning axillary adenopathy. Fluid-filled, patulous thoracic esophagus. Trachea is unremarkable. No concerning thyroid abnormality though poorly evaluated with superimposed streak artifact. Lungs/Pleura: Complex, bilateral loculated pleural effusions which are predominantly low-attenuation. There are regions of dense basilar pleural calcification in both lungs. A more diffuse pleural thickening is noted in the right lung as well. There are areas of adjacent passive atelectasis with additional more focal nodular atelectatic changes in the right lung (6/64). Could reflect some rounded atelectasis though underlying infection or disease is not fully excluded. Additional areas of bandlike reticular opacity with architectural distortion and scarring. Diffuse airways thickening and scattered secretions are noted as well. No pneumothorax. Upper Abdomen: Diffuse edematous changes noted throughout the upper abdomen and mesentery with small volume ascites. Extensive atherosclerotic plaque in the upper abdominal aorta. Calcified septation in the infrarenal abdominal aorta may reflect sequela of prior dissection or lamellar calcification of the aorta itself, incompletely assessed on this exam. Postsurgical changes from prior gastric surgery. Musculoskeletal: The osseous structures appear diffusely demineralized which may limit detection of small or nondisplaced fractures. No acute osseous abnormality or suspicious osseous lesion. Degenerative changes are present in the imaged spine and shoulders. Diffuse body wall edema. Review of the MIP images confirms the above findings. IMPRESSION: 1. Web-like filling defects in the distal left main pulmonary artery and extending into the lingular branches may reflect subacute to chronic pulmonary embolism  with acute pulmonary embolism less favored. More antidependent incomplete opacification of the left lower lobe pulmonary arteries could reflect laminar flow artifact though underlying embolism is not excluded. Evaluation for right heart strain is precluded in the setting of diffuse cardiomegaly and four-chamber cardiac enlargement though there is evidence of elevated right heart pressures and right-sided dysfunction with enlarged pulmonary arteries and refluxing contrast into the hepatic veins and IVC. Consider further characterization with echocardiography as clinically warranted. 2. Complex, bilateral loculated pleural effusions which are predominantly low-attenuation. Associated regions of dense basilar pleural calcification in both lungs. Findings can be on the spectrum of underlying asbestos related pleural disease though the sterility of these collections cannot be fully ascertained on an imaging basis. Extensive areas of passive atelectasis. More focal nodular atelectatic changes in the right lung as well. Could reflect some rounded atelectasis though underlying infection or disease is not fully excluded. Recommend attention on follow-up imaging. 3. Features of anasarca/volume overload with diffuse edematous changes throughout the mediastinum, mediastinum, upper abdomen, mesentery and body wall with small volume abdominal ascites. 4. Aortic Atherosclerosis (ICD10-I70.0). Currently attempting to contact the ordering provider with a critical value result. Addendum will be submitted upon case discussion. Electronically Signed: By: Lovena Le M.D. On: 09/29/2020 15:28   CT ABDOMEN PELVIS W CONTRAST  Result Date: 09/30/2020 CLINICAL DATA:  Unintentional weight loss EXAM:  CT ABDOMEN AND PELVIS WITH CONTRAST TECHNIQUE: Multidetector CT imaging of the abdomen and pelvis was performed using the standard protocol following bolus administration of intravenous contrast. CONTRAST:  30m OMNIPAQUE IOHEXOL 300 MG/ML   SOLN COMPARISON:  None. FINDINGS: Lower chest: Complex moderate right pleural effusion is again identified with associated pleural thickening. Small focus of extrapleural gas within this collection likely relates to recent thoracentesis. There is associated compressive atelectasis of the right lung base. Partially loculated left pleural effusion largely within the left major fissure appears stable since prior CT examination of 09/29/2020. Dense pleural calcifications at the left lung base likely relate to remote trauma or inflammation. Mild global cardiomegaly with particular enlargement of the a right ventricle and right atrium are again identified. Pacemaker leads are seen within the right heart and left ventricular venous outflow. Extensive calcifications are noted within the coronary arteries. Hepatobiliary: Reflux of contrast into the hepatic venous system is in keeping with at least some degree of right heart failure. Tiny probable cyst within the inferior right hepatic lobe. Liver otherwise unremarkable. No intra or extrahepatic biliary ductal dilation. Gallbladder unremarkable. Pancreas: Unremarkable Spleen: Unremarkable Adrenals/Urinary Tract: The adrenal glands are unremarkable. The kidneys are normal in position. There is mild-to-moderate bilateral renal cortical atrophy noted. Bilateral simple cortical cysts are identified. The kidneys are otherwise unremarkable. The bladder is unremarkable. Stomach/Bowel: Surgical changes of a partial gastrectomy are identified. Moderate sigmoid diverticulosis. The stomach, small bowel, and large bowel are otherwise unremarkable. No evidence of obstruction or focal inflammation. Appendix normal. No free intraperitoneal gas. Mild ascites is present. Vascular/Lymphatic: There is extensive aortoiliac atherosclerotic calcification with particularly prominent atherosclerotic calcification at the origin of the a renal and mesenteric arterial vasculature almost certainly  resulting in hemodynamically significant stenoses, not well characterized on this examination. Short segment focal dissection within the infrarenal abdominal aorta, likely the result of a remote penetrating atherosclerotic ulcer results in mild ectasia of the infrarenal abdominal aorta without frank aneurysm formation, with a maximal transaxial dimension of 2.6 cm. The common iliac arteries are ectatic bilaterally, left greater than right, without frank aneurysm formation. Extensive atherosclerotic calcification is noted within the lower extremity arterial inflow and visualized outflow. Reproductive: Prostate is unremarkable. Other: There is extensive subcutaneous edema noted throughout the body wall as well as retroperitoneal edema in keeping with changes of anasarca. Musculoskeletal: Degenerative changes are seen within the lumbar spine. No suspicious lytic or blastic bone lesions are seen. Degenerative changes are noted within the hips bilaterally. No acute bone abnormality. IMPRESSION: Complex right hydropneumothorax with small gaseous component likely related to recent thoracentesis. Cardiomegaly and extensive coronary artery calcification. Large mint of the right heart and reflux of contrast into the hepatic venous system in keeping with at least some degree of right heart failure. Dense calcification at the left lung base likely result of remote trauma or inflammation. Diffuse body wall subcutaneous edema, mild ascites, and retroperitoneal edema most in keeping with moderate anasarca, possibly the result of cardiogenic failure. Peripheral vascular disease with extensive atherosclerotic calcification at the origin of the mesenteric and renal vasculature almost certainly resulting in hemodynamically significant stenosis. Clinical correlation for signs and symptoms of chronic mesenteric ischemia and/or significant renal artery stenosis is recommended. Aortic Atherosclerosis (ICD10-I70.0). Electronically Signed    By: AFidela SalisburyMD   On: 09/30/2020 13:09   [place]  Result Date: 10/03/2020 CLINICAL DATA:  Acute kidney injury in a 87year old male EXAM: RENAL / URINARY TRACT ULTRASOUND COMPLETE COMPARISON:  Abdomen and  pelvis CT from 2022, February 2022 FINDINGS: Right Kidney: Renal measurements: 9.0 x 4.1 x 5.7 cm = volume: 110 mL. Slight increased echogenicity of renal cortex. Cyst in the interpolar aspect of the RIGHT kidney 2.5 x 2.4 x 1.4 cm. No hydronephrosis. Left Kidney: Renal measurements: 9.6 x 4.8 x 4.8 cm = volume: 114 mL. Mild increased echogenicity the without hydronephrosis or focal lesion aside from a small cyst in the lower pole. Bladder: Under distended urinary bladder with small volume fluid in the pelvis, small volume fluid in the pelvis seen on prior imaging Other: Small volume ascites in the pelvis as discussed. IMPRESSION: Mild increased cortical echogenicity as can be seen in the setting of medical renal disease. No hydronephrosis. Small volume pelvic ascites. Electronically Signed   By: Zetta Bills M.D.   On: 10/03/2020 10:52   DG Chest Port 1 View  Result Date: 10/04/2020 CLINICAL DATA:  Prior right thoracentesis. EXAM: PORTABLE CHEST 1 VIEW COMPARISON:  10/03/2020.  10/02/2020. FINDINGS: AICD noted stable position. Stable cardiomegaly. Bilateral interstitial infiltrates/edema cannot be excluded. Right pleural effusion is stable. Stable loculated left pleural effusion. Left base calcified pleural plaques again noted. No pneumothorax noted on today's exam. IMPRESSION: 1. Stable right pleural effusion. No pneumothorax noted on today's exam. Stable loculated left pleural effusion. 2. AICD noted stable position. Stable cardiomegaly. Bilateral interstitial edema/infiltrates cannot be excluded. Electronically Signed   By: Marcello Moores  Register   On: 10/04/2020 08:24   DG Chest Port 1 View  Result Date: 10/03/2020 CLINICAL DATA:  Status post thoracentesis. EXAM: PORTABLE CHEST 1 VIEW  COMPARISON:  Same day. FINDINGS: Stable cardiomediastinal silhouette. Right pleural effusion is significantly smaller status post thoracentesis. Possible small right apical pneumothorax is noted which most likely represents ex vacuo pneumothorax. Stable right lung opacity is noted. IMPRESSION: Right pleural effusion is significantly smaller status post thoracentesis. Possible small right apical pneumothorax is noted which most likely represents ex vacuo pneumothorax. Stable right lung opacity. Electronically Signed   By: Marijo Conception M.D.   On: 10/03/2020 16:10   DG Chest Port 1 View  Result Date: 10/02/2020 CLINICAL DATA:  Fatigue EXAM: PORTABLE CHEST 1 VIEW COMPARISON:  09/30/2020 FINDINGS: There are persistent bilateral loculated pleural effusions which appear to have increased on the right but are stable on the left. Pleural base calcifications are again noted. There is no pneumothorax. The heart size is stable. Aortic calcifications are noted. There is a biventricular pacemaker in place. There is no acute osseous abnormality. IMPRESSION: Persistent bilateral loculated pleural effusions, increased on the right but stable on the left. Electronically Signed   By: Constance Holster M.D.   On: 10/02/2020 18:43   DG Chest Port 1 View  Result Date: 09/30/2020 CLINICAL DATA:  Status post right-sided thoracentesis. EXAM: PORTABLE CHEST 1 VIEW COMPARISON:  CT a chest in chest x-ray from yesterday. FINDINGS: Unchanged left chest wall pacemaker. Stable cardiomegaly and pulmonary artery enlargement. Loculated bilateral pleural effusions again noted, slightly decreased on the right status post thoracentesis. No pneumothorax. Unchanged pleural calcification at the lung bases and bilateral lower lobe atelectasis. No acute osseous abnormality. IMPRESSION: 1. Loculated bilateral pleural effusions, slightly decreased on the right status post thoracentesis. No pneumothorax. Electronically Signed   By: Titus Dubin  M.D.   On: 09/30/2020 12:34   DG Chest Portable 1 View  Result Date: 09/29/2020 CLINICAL DATA:  Dyspnea EXAM: PORTABLE CHEST 1 VIEW COMPARISON:  None. FINDINGS: Bilateral basilar laterally loculated pleural effusions are present, moderate on  the right and small on the left. Left basilar pleural calcifications are identified. There is enlargement of the right hilum suggesting presence of right hilar mass or right hilar adenopathy. There is partial right lower lobe collapse. Opacity within the left perihilar region may represent fluid within the left major fissure. A left perihilar mass, however, is difficult to exclude. No pneumothorax. Mild cardiomegaly. Left subclavian 3 lead pacemaker in place. Vascular calcifications are seen within the abdominal aorta. The pulmonary vascularity is normal. No acute bone abnormality. IMPRESSION: Suspected right hilar mass or adenopathy with subtotal collapse of the right lower lobe possibly representing postobstructive collapse. Bilateral loculated pleural effusions, moderate on the right and small on the left. Coarse left basilar pleural calcification may relate to remote trauma or infection. Left perihilar opacity possibly representing fluid within the fissure or a left perihilar focal pulmonary mass. This would be better assessed with dedicated contrast enhanced CT imaging. Mild cardiomegaly. Electronically Signed   By: Fidela Salisbury MD   On: 09/29/2020 11:08   ECHOCARDIOGRAM COMPLETE  Result Date: 09/30/2020    ECHOCARDIOGRAM REPORT   Patient Name:   ADDIEL MCCARDLE Date of Exam: 09/30/2020 Medical Rec #:  [id number]       Height:       67.0 in Accession #:    [id number]      Weight:       88.3 lb Date of Birth:  Dec 30, 1931       BSA:          1.429 m Patient Age:    12 years        BP:           87/50 mmHg Patient Gender: M               HR:           76 bpm. Exam Location:  [facility] Procedure: 2D Echo, Color Doppler and Cardiac Doppler Indications:     R06.00 Dyspnea   History:         Patient has no prior history of Echocardiogram examinations.                  Arrythmias:Paroxymal atrial fibrillation,                  Signs/Symptoms:Shortness of Breath; Risk Factors:Hypertension.  Sonographer:     Charmayne Sheer RDCS (AE) Referring Phys:  [id number] AMY N COX Diagnosing Phys: Bartholome Bill MD  Sonographer Comments: TDS due to bony thorax. IMPRESSIONS  1. Left ventricular ejection fraction, by estimation, is 65 to 70%. The left ventricle has normal function. The left ventricle has no regional wall motion abnormalities. Left ventricular diastolic parameters were normal.  2. Right ventricular systolic function is normal. The right ventricular size is mildly enlarged.  3. Left atrial size was mildly dilated.  4. Right atrial size was mildly dilated.  5. The mitral valve is grossly normal. Mild to moderate mitral valve regurgitation.  6. The aortic valve is calcified. Aortic valve regurgitation is trivial. Mild to moderate aortic valve sclerosis/calcification is present, without any evidence of aortic stenosis. FINDINGS  Left Ventricle: Left ventricular ejection fraction, by estimation, is 65 to 70%. The left ventricle has normal function. The left ventricle has no regional wall motion abnormalities. The left ventricular internal cavity size was normal in size. There is  borderline left ventricular hypertrophy. Left ventricular diastolic parameters were normal. Right Ventricle: The right ventricular size is mildly enlarged. No increase in right  ventricular wall thickness. Right ventricular systolic function is normal. Left Atrium: Left atrial size was mildly dilated. Right Atrium: Right atrial size was mildly dilated. Pericardium: There is no evidence of pericardial effusion. Mitral Valve: The mitral valve is grossly normal. Mild to moderate mitral valve regurgitation. MV peak gradient, 3.1 mmHg. The mean mitral valve gradient is 1.0 mmHg. Tricuspid Valve: The tricuspid valve is not well  visualized. Tricuspid valve regurgitation is trivial. Aortic Valve: The aortic valve is calcified. Aortic valve regurgitation is trivial. Mild to moderate aortic valve sclerosis/calcification is present, without any evidence of aortic stenosis. Aortic valve mean gradient measures 3.0 mmHg. Aortic valve peak  gradient measures 5.9 mmHg. Aortic valve area, by VTI measures 1.71 cm. Pulmonic Valve: The pulmonic valve was not well visualized. Pulmonic valve regurgitation is trivial. Aorta: The aortic root is normal in size and structure. IAS/Shunts: No atrial level shunt detected by color flow Doppler. Additional Comments: A pacer wire is visualized.  LEFT VENTRICLE PLAX 2D LVIDd:         3.50 cm LVIDs:         2.10 cm LV PW:         1.10 cm LV IVS:        0.80 cm LVOT diam:     2.20 cm LV SV:         38 LV SV Index:   26 LVOT Area:     3.80 cm  RIGHT VENTRICLE RV Basal diam:  4.80 cm LEFT ATRIUM             Index       RIGHT ATRIUM           Index LA diam:        4.10 cm 2.87 cm/m  RA Area:     30.00 cm LA Vol (A2C):   40.4 ml 28.28 ml/m RA Volume:   112.00 ml 78.39 ml/m LA Vol (A4C):   92.3 ml 64.61 ml/m LA Biplane Vol: 65.3 ml 45.71 ml/m  AORTIC VALVE                   PULMONIC VALVE AV Area (Vmax):    1.97 cm    PV Vmax:       0.68 m/s AV Area (Vmean):   1.92 cm    PV Vmean:      42.900 cm/s AV Area (VTI):     1.71 cm    PV VTI:        0.128 m AV Vmax:           121.00 cm/s PV Peak grad:  1.9 mmHg AV Vmean:          79.000 cm/s PV Mean grad:  1.0 mmHg AV VTI:            0.220 m AV Peak Grad:      5.9 mmHg AV Mean Grad:      3.0 mmHg LVOT Vmax:         62.70 cm/s LVOT Vmean:        40.000 cm/s LVOT VTI:          0.099 m LVOT/AV VTI ratio: 0.45  AORTA Ao Root diam: 3.50 cm MITRAL VALVE MV Area (PHT): 6.96 cm    SHUNTS MV Area VTI:   2.36 cm    Systemic VTI:  0.10 m MV Peak grad:  3.1 mmHg    Systemic Diam: 2.20 cm MV Mean grad:  1.0 mmHg MV Vmax:  0.88 m/s MV Vmean:      47.8 cm/s MV Decel Time: 109  msec MV E velocity: 75.80 cm/s MV A velocity: 36.80 cm/s MV E/A ratio:  2.06 Bartholome Bill MD Electronically signed by Bartholome Bill MD Signature Date/Time: 09/30/2020/11:36:34 AM    Final    US LIVER DOPPLER  Result Date: 10/02/2020 CLINICAL DATA:  Hepatic cirrhosis EXAM: DUPLEX ULTRASOUND OF LIVER TECHNIQUE: Color and duplex Doppler ultrasound was performed to evaluate the hepatic in-flow and out-flow vessels. COMPARISON:  09/30/2020 CT with contrast FINDINGS: Liver: Increased hepatic echogenicity. No definite contour abnormality or significant surface nodularity. No focal lesion, mass or intrahepatic biliary ductal dilatation. Main Portal Vein size: 0.9 cm Portal Vein Velocities Main Prox:  21 cm/sec Main Mid: 40 cm/sec Main Dist:  36 cm/sec Right: 32 cm/sec Left: 24 cm/sec Hepatic Vein Velocities Right:  51 cm/sec Middle:  58 cm/sec Left:  51 cm/sec IVC: Present and patent with normal respiratory phasicity. Hepatic Artery Velocity:  71 cm/sec Splenic Vein Velocity:  14 cm/sec Spleen: 4 cm x 4 cm x 6 cm with a total volume of 62 cm^3 (411 cm^3 is upper limit normal) Portal Vein Occlusion/Thrombus: No Splenic Vein Occlusion/Thrombus: No Ascites: None Varices: None IMPRESSION: Hepatic veins and IVC are dilated suggesting component of right heart failure. Portal, hepatic and splenic veins are all patent with normal directional flow. No veno-occlusive process. Electronically Signed   By: Jerilynn Mages.  Shick M.D.   On: 10/02/2020 11:19   US THORACENTESIS ASP PLEURAL SPACE W/IMG GUIDE  Result Date: 10/04/2020 INDICATION: Patient with history of pulmonary hypertension and persistent AFib. Found to have a recurrent right-sided pleural effusion. Team is requesting therapeutic and diagnostic thoracentesis EXAM: ULTRASOUND GUIDED THERAPEUTIC AND DIAGNOSTIC THORACENTESIS MEDICATIONS: Lidocaine 1% 10 mL COMPLICATIONS: None immediate. PROCEDURE: An ultrasound guided thoracentesis was thoroughly discussed with the patient and  questions answered. The benefits, risks, alternatives and complications were also discussed. The patient understands and wishes to proceed with the procedure. Written consent was obtained. Ultrasound was performed to localize and mark an adequate pocket of fluid in the right chest. The area was then prepped and draped in the normal sterile fashion. 1% Lidocaine was used for local anesthesia. Under ultrasound guidance a 6 Fr Safe-T-Centesis catheter was introduced. Thoracentesis was performed. The catheter was removed and a dressing applied. FINDINGS: A total of approximately 350 mL of amber colored fluid was removed. Samples were sent to the laboratory as requested by the clinical team. IMPRESSION: Successful ultrasound guided therapeutic and diagnostic right sided thoracentesis yielding 350 mL of pleural fluid. Read by: Rushie Nyhan, NP Electronically Signed   By: Miachel Roux M.D.   On: 10/03/2020 16:35   US THORACENTESIS ASP PLEURAL SPACE W/IMG GUIDE  Result Date: 09/30/2020 INDICATION: Patient with history of pulmonary hypertension and persistent AFib. Found to have bilateral pleural effusions after presenting to the emergency department with shortness of breath. Team is requesting therapeutic and diagnostic thoracentesis EXAM: ULTRASOUND GUIDED RIGHT-SIDED THERAPEUTIC AND DIAGNOSTIC THORACENTESIS MEDICATIONS: Lidocaine 1% 10 mL COMPLICATIONS: None immediate. PROCEDURE: An ultrasound guided thoracentesis was thoroughly discussed with the patient and questions answered. The benefits, risks, alternatives and complications were also discussed. The patient understands and wishes to proceed with the procedure. Written consent was obtained. Ultrasound was performed to localize and mark an adequate pocket of fluid in the right chest. The area was then prepped and draped in the normal sterile fashion. 1% Lidocaine was used for local anesthesia. Under ultrasound guidance a 6 Fr  Safe-T-Centesis catheter was  introduced. Thoracentesis was performed. The catheter was removed and a dressing applied. FINDINGS: A total of approximately 350 mL of amber colored fluid was removed. Samples were sent to the laboratory as requested by the clinical team. Patient unable to tolerate additional fluid removal and per patient request the procedure was terminated. IMPRESSION: Successful ultrasound guided therapeutic and diagnostic right-sided thoracentesis yielding 350 mL of pleural fluid. Read by: Rushie Nyhan, NP Electronically Signed   By: Sandi Mariscal M.D.   On: 09/30/2020 12:54       Subjective: Patient is at this point essentially nonverbal.  He offers no complaints other than that he has to pee.  No fever, no respiratory distress.    Discharge Exam: Vitals:   10/07/20 0807 10/07/20 1241  BP: 122/72 96/61  Pulse: 67 69  Resp: 16 16  Temp: 98 F (36.7 C) 98.1 F (36.7 C)  SpO2: 97% 95%   Vitals:   10/06/20 1933 10/07/20 0358 10/07/20 0807 10/07/20 1241  BP: 112/71 100/61 122/72 96/61  Pulse: 68 65 67 69  Resp: '18 18 16 16  ' Temp: 97.8 F (36.6 C) 97.7 F (36.5 C) 98 F (36.7 C) 98.1 F (36.7 C)  TempSrc: Oral     SpO2: 100% 96% 97% 95%  Weight:  42.7 kg    Height:        General: Pt sluggish, weak. Cardiovascular: RRR, nl S1-S2, no murmurs appreciated.   No LE edema.   Respiratory: Normal respiratory rate and rhythm.  CTAB without rales or wheezes. Abdominal: Abdomen emaciated, no rigidity or rebound Neuro/Psych: Severely diminished strength in upper and lower extremities.  Judgment and insight appear impaired.  He is somnolent.   The results of significant diagnostics from this hospitalization (including imaging, microbiology, ancillary and laboratory) are listed below for reference.     Microbiology: Recent Results (from the past 240 hour(s))  Resp Panel by RT-PCR (Flu A&B, Covid) Nasopharyngeal Swab     Status: None   Collection Time: 09/29/20 11:02 AM   Specimen:  Nasopharyngeal Swab; Nasopharyngeal(NP) swabs in vial transport medium  Result Value Ref Range Status   SARS Coronavirus 2 by RT PCR NEGATIVE NEGATIVE Final    Comment: (NOTE) SARS-CoV-2 target nucleic acids are NOT DETECTED.  The SARS-CoV-2 RNA is generally detectable in upper respiratory specimens during the acute phase of infection. The lowest concentration of SARS-CoV-2 viral copies this assay can detect is 138 copies/mL. A negative result does not preclude SARS-Cov-2 infection and should not be used as the sole basis for treatment or other patient management decisions. A negative result may occur with  improper specimen collection/handling, submission of specimen other than nasopharyngeal swab, presence of viral mutation(s) within the areas targeted by this assay, and inadequate number of viral copies(<138 copies/mL). A negative result must be combined with clinical observations, patient history, and epidemiological information. The expected result is Negative.  Fact Sheet for Patients:  EntrepreneurPulse.com.au  Fact Sheet for Healthcare Providers:  IncredibleEmployment.be  This test is no t yet approved or cleared by the [place] FDA and  has been authorized for detection and/or diagnosis of SARS-CoV-2 by FDA under an Emergency Use Authorization (EUA). This EUA will remain  in effect (meaning this test can be used) for the duration of the COVID-19 declaration under Section 564(b)(1) of the Act, 21 U.S.C.section 360bbb-3(b)(1), unless the authorization is terminated  or revoked sooner.       Influenza A by PCR NEGATIVE NEGATIVE Final  Influenza B by PCR NEGATIVE NEGATIVE Final    Comment: (NOTE) The Xpert Xpress SARS-CoV-2/FLU/RSV plus assay is intended as an aid in the diagnosis of influenza from Nasopharyngeal swab specimens and should not be used as a sole basis for treatment. Nasal washings and aspirates are unacceptable for  Xpert Xpress SARS-CoV-2/FLU/RSV testing.  Fact Sheet for Patients: EntrepreneurPulse.com.au  Fact Sheet for Healthcare Providers: IncredibleEmployment.be  This test is not yet approved or cleared by the [place] FDA and has been authorized for detection and/or diagnosis of SARS-CoV-2 by FDA under an Emergency Use Authorization (EUA). This EUA will remain in effect (meaning this test can be used) for the duration of the COVID-19 declaration under Section 564(b)(1) of the Act, 21 U.S.C. section 360bbb-3(b)(1), unless the authorization is terminated or revoked.  Performed at [facility], [address] [place], [place] [address]   MRSA PCR Screening     Status: None   Collection Time: 09/29/20  6:41 PM   Specimen: Nasopharyngeal  Result Value Ref Range Status   MRSA by PCR NEGATIVE NEGATIVE Final    Comment:        The GeneXpert MRSA Assay (FDA approved for NASAL specimens only), is one component of a comprehensive MRSA colonization surveillance program. It is not intended to diagnose MRSA infection nor to guide or monitor treatment for MRSA infections. Performed at [facility], [address] [place], [place] [address]   Body fluid culture     Status: None   Collection Time: 09/30/20 12:00 PM   Specimen: PATH Cytology Pleural fluid  Result Value Ref Range Status   Specimen Description   Final    PLEURAL Performed at [facility], [address], [place], [place] [address]    Special Requests   Final    NONE Performed at [facility], [place], [place] [address]    Gram Stain NO WBC SEEN NO ORGANISMS SEEN   Final   Culture   Final    NO GROWTH 3 DAYS Performed at [facility], [place] [address] [place], [place] [address]    Report Status 10/03/2020 FINAL  Final  Fungus Culture With Stain     Status: None (Preliminary result)   Collection Time: 09/30/20 12:00 PM    Specimen: PATH Cytology Pleural fluid  Result Value Ref Range Status   Fungus Stain Final report  Final    Comment: (NOTE) Performed At: [facility] [place], [place] [id number] Rush Farmer MD [id number]    Fungus (Mycology) Culture PENDING  Incomplete   Fungal Source PLEURAL  Final    Comment: Performed at [facility], [address] [place], [place] [address]  Acid Fast Smear (AFB)     Status: None   Collection Time: 09/30/20 12:00 PM   Specimen: PATH Cytology Pleural fluid  Result Value Ref Range Status   AFB Specimen Processing Concentration  Final   Acid Fast Smear Negative  Final    Comment: (NOTE) Performed At: [facility] [place], [place] [id number] Rush Farmer MD [id number]    Source (AFB) PLEURAL  Final    Comment: Performed at [facility], [address] [place], [place] [address]  Fungus Culture Result     Status: None   Collection Time: 09/30/20 12:00 PM  Result Value Ref Range Status   Result 1 Comment  Final    Comment: (NOTE) KOH/Calcofluor preparation:  no fungus observed. Performed  At: [facility] [place], [place] [id number] Rush Farmer MD RJ:[id number]   Fungus Culture With Stain     Status: None (Preliminary result)   Collection Time: 10/03/20  3:40 PM   Specimen: PATH Cytology Pleural fluid  Result Value Ref Range Status   Fungus Stain Final report  Final    Comment: (NOTE) Performed At: [facility] [place], [place] [id number] Rush Farmer MD [id number]    Fungus (Mycology) Culture PENDING  Incomplete   Fungal Source PLEURAL  Final    Comment: Performed at [facility], [address] [place], [place] [address]  Body fluid culture w Gram Stain     Status: None   Collection Time: 10/03/20  3:40 PM   Specimen: PATH Cytology Pleural fluid  Result Value Ref Range Status   Specimen Description    Final    PLEURAL Performed at [facility], [address] [place], [place] [address]    Special Requests   Final    PLEURAL Performed at [facility], [address] [place], [place] [address]    Gram Stain NO WBC SEEN NO ORGANISMS SEEN   Final   Culture   Final    NO GROWTH Performed at [facility], [place] [address], [place], [place] [address]    Report Status 10/07/2020 FINAL  Final  Fungus Culture Result     Status: None   Collection Time: 10/03/20  3:40 PM  Result Value Ref Range Status   Result 1 Comment  Final    Comment: (NOTE) KOH/Calcofluor preparation:  no fungus observed. Performed At: [facility] [place], [place] [id number] Rush Farmer MD YB:[id number]      Labs: BNP (last 3 results) Recent Labs    09/29/20 1102  BNP 7,159.5*   Basic Metabolic Panel: Recent Labs  Lab 10/04/20 0502 10/04/20 1353 10/05/20 0651 10/05/20 1445 10/05/20 2206 10/06/20 0545 10/06/20 0713 10/07/20 1037  NA 123*  --  123* 121* 122* 121* 123* 121*  K 5.6* 5.3* 5.6* 5.0  --  4.8  --  5.2*  CL 88*  --  87* 86*  --  87*  --  89*  CO2 25  --  23 25  --  24  --  21*  GLUCOSE 122*  --  140* 139*  --  128*  --  117*  BUN 87*  --  101* 104*  --  121*  --  148*  CREATININE 3.77*  --  4.44* 4.60*  --  4.79*  --  5.06*  CALCIUM 10.2  --  10.3 10.1  --  10.1  --  9.8   Liver Function Tests: Recent Labs  Lab 10/02/20 0444 10/03/20 0409 10/04/20 0502 10/05/20 1445  AST 34 25 28 35  ALT '16 13 17 24  ' ALKPHOS 105 109 100 104  BILITOT 1.0 0.6 0.6 0.9  PROT 6.0* 6.2* 6.3* 6.8  ALBUMIN 3.2* 3.3* 3.3* 3.6   No results for input(s): LIPASE, AMYLASE in the last 168 hours. Recent Labs  Lab 10/06/20 1304  AMMONIA 13   CBC: Recent Labs  Lab 10/01/20 0432 10/02/20 0444 10/03/20 0409 10/06/20 0545  WBC 4.7 5.2 7.7 8.0  HGB 9.8* 9.0* 9.6* 10.5*  HCT 28.8* 25.9* 27.4* 30.1*  MCV 94.4 94.9 94.5 95.3  PLT 130* 134* 148* 204    Cardiac Enzymes: No results for input(s): CKTOTAL, CKMB, CKMBINDEX, TROPONINI in the last 168 hours.  BNP: Invalid input(s): POCBNP CBG: Recent Labs  Lab 10/01/20 2123  GLUCAP 217*   D-Dimer No results for input(s): DDIMER in the last 72 hours. Hgb A1c No results for input(s): HGBA1C in the last 72 hours. Lipid Profile No results for input(s): CHOL, HDL, LDLCALC, TRIG, CHOLHDL, LDLDIRECT in the last 72 hours. Thyroid function studies No results for input(s): TSH, T4TOTAL, T3FREE, THYROIDAB in the last 72 hours.  Invalid input(s): FREET3 Anemia work up Recent Labs    10/05/20 0651  VITAMINB12 1,276*   Urinalysis    Component Value Date/Time   COLORURINE AMBER (A) 10/04/2020 2210   APPEARANCEUR CLOUDY (A) 10/04/2020 2210   LABSPEC 1.024 10/04/2020 2210   PHURINE 5.0 10/04/2020 2210   GLUCOSEU NEGATIVE 10/04/2020 2210   HGBUR NEGATIVE 10/04/2020 2210   BILIRUBINUR NEGATIVE 10/04/2020 2210   KETONESUR NEGATIVE 10/04/2020 2210   PROTEINUR 30 (A) 10/04/2020 2210   NITRITE NEGATIVE 10/04/2020 2210   LEUKOCYTESUR NEGATIVE 10/04/2020 2210   Sepsis Labs Invalid input(s): PROCALCITONIN,  WBC,  LACTICIDVEN Microbiology Recent Results (from the past 240 hour(s))  Resp Panel by RT-PCR (Flu A&B, Covid) Nasopharyngeal Swab     Status: None   Collection Time: 09/29/20 11:02 AM   Specimen: Nasopharyngeal Swab; Nasopharyngeal(NP) swabs in vial transport medium  Result Value Ref Range Status   SARS Coronavirus 2 by RT PCR NEGATIVE NEGATIVE Final    Comment: (NOTE) SARS-CoV-2 target nucleic acids are NOT DETECTED.  The SARS-CoV-2 RNA is generally detectable in upper respiratory specimens during the acute phase of infection. The lowest concentration of SARS-CoV-2 viral copies this assay can detect is 138 copies/mL. A negative result does not preclude SARS-Cov-2 infection and should not be used as the sole basis for treatment or other patient management decisions. A negative  result may occur with  improper specimen collection/handling, submission of specimen other than nasopharyngeal swab, presence of viral mutation(s) within the areas targeted by this assay, and inadequate number of viral copies(<138 copies/mL). A negative result must be combined with clinical observations, patient history, and epidemiological information. The expected result is Negative.  Fact Sheet for Patients:  EntrepreneurPulse.com.au  Fact Sheet for Healthcare Providers:  IncredibleEmployment.be  This test is no t yet approved or cleared by the [place] FDA and  has been authorized for detection and/or diagnosis of SARS-CoV-2 by FDA under an Emergency Use Authorization (EUA). This EUA will remain  in effect (meaning this test can be used) for the duration of the COVID-19 declaration under Section 564(b)(1) of the Act, 21 U.S.C.section 360bbb-3(b)(1), unless the authorization is terminated  or revoked sooner.       Influenza A by PCR NEGATIVE NEGATIVE Final   Influenza B by PCR NEGATIVE NEGATIVE Final    Comment: (NOTE) The Xpert Xpress SARS-CoV-2/FLU/RSV plus assay is intended as an aid in the diagnosis of influenza from Nasopharyngeal swab specimens and should not be used as a sole basis for treatment. Nasal washings and aspirates are unacceptable for Xpert Xpress SARS-CoV-2/FLU/RSV testing.  Fact Sheet for Patients: EntrepreneurPulse.com.au  Fact Sheet for Healthcare Providers: IncredibleEmployment.be  This test is not yet approved or cleared by the [place] FDA and has been authorized for detection and/or diagnosis of SARS-CoV-2 by FDA under an Emergency Use Authorization (EUA). This EUA will remain in effect (meaning this test can be used) for the duration of the COVID-19 declaration under Section 564(b)(1) of the Act, 21 U.S.C. section 360bbb-3(b)(1), unless the authorization is  terminated or revoked.  Performed at  [facility], [address] [place], [place] [address]   MRSA PCR Screening     Status: None   Collection Time: 09/29/20  6:41 PM   Specimen: Nasopharyngeal  Result Value Ref Range Status   MRSA by PCR NEGATIVE NEGATIVE Final    Comment:        The GeneXpert MRSA Assay (FDA approved for NASAL specimens only), is one component of a comprehensive MRSA colonization surveillance program. It is not intended to diagnose MRSA infection nor to guide or monitor treatment for MRSA infections. Performed at [facility], [address] [place], [place] [address]   Body fluid culture     Status: None   Collection Time: 09/30/20 12:00 PM   Specimen: PATH Cytology Pleural fluid  Result Value Ref Range Status   Specimen Description   Final    PLEURAL Performed at [facility], [address] [place], [place] [address]    Special Requests   Final    NONE Performed at [facility], [place], [place] [address]    Gram Stain NO WBC SEEN NO ORGANISMS SEEN   Final   Culture   Final    NO GROWTH 3 DAYS Performed at [facility], [place] [address], [place], [place] [address]    Report Status 10/03/2020 FINAL  Final  Fungus Culture With Stain     Status: None (Preliminary result)   Collection Time: 09/30/20 12:00 PM   Specimen: PATH Cytology Pleural fluid  Result Value Ref Range Status   Fungus Stain Final report  Final    Comment: (NOTE) Performed At: [facility] [place], [place] [id number] Rush Farmer MD [id number]    Fungus (Mycology) Culture PENDING  Incomplete   Fungal Source PLEURAL  Final    Comment: Performed at [facility], [address] [place], [place] [address]  Acid Fast Smear (AFB)     Status: None   Collection Time: 09/30/20 12:00 PM   Specimen: PATH Cytology Pleural fluid  Result Value Ref Range Status   AFB Specimen  Processing Concentration  Final   Acid Fast Smear Negative  Final    Comment: (NOTE) Performed At: [facility] [place], [place] [id number] Rush Farmer MD [id number]    Source (AFB) PLEURAL  Final    Comment: Performed at [facility], [address] [place], [place] [address]  Fungus Culture Result     Status: None   Collection Time: 09/30/20 12:00 PM  Result Value Ref Range Status   Result 1 Comment  Final    Comment: (NOTE) KOH/Calcofluor preparation:  no fungus observed. Performed At: [facility] [place], [place] [id number] Rush Farmer MD [id number]   Fungus Culture With Stain     Status: None (Preliminary result)   Collection Time: 10/03/20  3:40 PM   Specimen: PATH Cytology Pleural fluid  Result Value Ref Range Status   Fungus Stain Final report  Final    Comment: (NOTE) Performed At: [facility] [place], [place] [id number] Rush Farmer MD [id number]    Fungus (Mycology) Culture PENDING  Incomplete   Fungal Source PLEURAL  Final    Comment: Performed at [facility], [address] [place],  [address]  Body fluid culture w Gram Stain     Status: None   Collection Time: 10/03/20  3:40 PM   Specimen: PATH Cytology Pleural fluid  Result  Value Ref Range Status   Specimen Description   Final    PLEURAL Performed at [facility], [address], [place], [place] [address]    Special Requests   Final    PLEURAL Performed at [facility], [place], [place] [address]    Gram Stain NO WBC SEEN NO ORGANISMS SEEN   Final   Culture   Final    NO GROWTH Performed at [facility], [place] [address], [place], [place] [address]    Report Status 10/07/2020 FINAL  Final  Fungus Culture Result     Status: None   Collection Time: 10/03/20  3:40 PM  Result Value Ref Range Status   Result 1 Comment  Final    Comment:  (NOTE) KOH/Calcofluor preparation:  no fungus observed. Performed At: [facility] [place], [place] [id number] Rush Farmer MD [id number]      Time coordinating discharge: 35 minutes The [place] controlled substances registry was reviewed for this patient although the STOP act does not apply, as he is being discharged with HOspice.      SIGNED:   Edwin Dada, MD  [facility] 10/07/2020, 2:37 PM

## 2020-10-07 NOTE — Progress Notes (Signed)
[facility] Room 242 [facility] Hospital Liaison RN note:  Chart and patient information has been reviewed by [facility] physician and hospice eligibility has been approved.  Visited with patient and daughter, Meg at bedside. Spoke with son, ED over the phone. Discussed hospice philosophy and services provided and all questions have been answered. Family is in agreement to proceed with hospice services at discharge.   DME needs discussed. Family requests a hospital bed, OBT, 3 in 1 potty chair and O2 PRN. DME is set to be delivered between 2-4pm. Address has been verified and is correct on facesheet. Contact is daughter, Meg. Hospital care team is aware of above information.  Please provide prescriptions at discharge as needed to ensure ongoing symptom management.  Please call with any hospice related questions or concerns.  Thank you for the opportunity to participate in this patient's care.  Cyndra Numbers, RN [facility] Liaison [phone]

## 2020-10-07 NOTE — Progress Notes (Signed)
Daily Progress Note   Patient Name: Patrick Mccormick       Date: 10/07/2020 DOB: 1931/12/19  Age: 87 y.o. MRN#: [id number] Attending Physician: Alberteen Sam, * Primary Care Physician: Conan Bowens., MD Admit Date: 09/29/2020  Reason for Consultation/Follow-up: Establishing goals of care  Subjective: Patient is resting in bed. He is alert. Daughter is at bedside. She states he is better today than yesterday. Discussed his declining evening medications. We discussed his overall status past and present, as well as future prognosis. [facility] liaison at bedside. Daughter discussing becoming his caregiver. Questions answered and discussion of hospice vs palliative with an overall goal of getting him home to see how he does. Discussed the difference between hospice and palliative medicine. Inquired what path they would want to take if he declines; no definitive choice at present. Daughter states she will have to speak with her brother to determine plans moving forward. Attempted to discuss with patient to determine his wishes but he did not answer questions.  Stepped out so that [facility] and daughter could speak further.   Length of Stay: 7  Current Medications: Scheduled Meds:  . apixaban  5 mg Oral BID  . vitamin C  250 mg Oral BID  . feeding supplement  237 mL Oral TID BM  . levothyroxine  25 mcg Oral Q0600  . mirtazapine  7.5 mg Oral QHS  . multivitamin with minerals  1 tablet Oral Daily    Continuous Infusions:   PRN Meds: morphine CONCENTRATE **OR** morphine CONCENTRATE, ondansetron **OR** ondansetron (ZOFRAN) IV, sodium chloride  Physical Exam Pulmonary:     Effort: Pulmonary effort is normal.  Neurological:     Mental Status: He is alert.             Vital  Signs: BP 122/72 (BP Location: Left Arm)   Pulse 67   Temp 98 F (36.7 C)   Resp 16   Ht 5\' 7"  (1.702 m)   Wt 42.7 kg   SpO2 97%   BMI 14.74 kg/m  SpO2: SpO2: 97 % O2 Device: O2 Device: Nasal Cannula O2 Flow Rate: O2 Flow Rate (L/min): 1 L/min  Intake/output summary:   Intake/Output Summary (Last 24 hours) at 10/07/2020 1002 Last data filed at 10/07/2020 0956 Gross per 24 hour  Intake 0 ml  Output  250 ml  Net -250 ml   LBM: Last BM Date: 10/04/20 Baseline Weight: Weight: 47.3 kg Most recent weight: Weight: 42.7 kg   Flowsheet Rows   Flowsheet Row Most Recent Value  Intake Tab   Referral Department Hospitalist  Unit at Time of Referral Cardiac/Telemetry Unit  Palliative Care Primary Diagnosis Pulmonary  Date Notified 10/03/20  Palliative Care Type New Palliative care  Reason for referral Clarify Goals of Care  Date of Admission 09/29/20  Date first seen by Palliative Care 10/04/20  # of days Palliative referral response time 1 Day(s)  # of days IP prior to Palliative referral 4  Clinical Assessment   Palliative Performance Scale Score 30%  Pain Max last 24 hours Not able to report  Pain Min Last 24 hours Not able to report  Dyspnea Max Last 24 Hours Not able to report  Dyspnea Min Last 24 hours Not able to report  Psychosocial & Spiritual Assessment   Palliative Care Outcomes       Patient Active Problem List   Diagnosis Date Noted  . Protein-calorie malnutrition, severe 09/30/2020  . Pleural effusion due to congestive heart failure (HCC) 09/29/2020  . Pulmonary hypertension (HCC) 09/29/2020  . Atrial fibrillation, chronic (HCC) 09/29/2020  . History of partial gastrectomy 09/29/2020  . Respiratory distress 09/29/2020    Palliative Care Assessment & Plan    Recommendations/Plan: Family's overall goal is to get patient home and adjust as needed.  Family speaking with [facility] to determine palliative vs hospice.    Code Status:    Code Status  Orders  (From admission, onward)         Start     Ordered   10/06/20 1601  Limited resuscitation (code)  Continuous       Question Answer Comment  In the event of cardiac or respiratory ARREST: Initiate Code Blue, Call Rapid Response No   In the event of cardiac or respiratory ARREST: Perform CPR No   In the event of cardiac or respiratory ARREST: Perform Intubation/Mechanical Ventilation Yes   In the event of cardiac or respiratory ARREST: Use NIPPV/BiPAp only if indicated Yes   In the event of cardiac or respiratory ARREST: Administer ACLS medications if indicated No   In the event of cardiac or respiratory ARREST: Perform Defibrillation or Cardioversion if indicated No      10/06/20 1600        Code Status History    Date Active Date Inactive Code Status Order ID Comments User Context   09/29/2020 1303 10/06/2020 1600 Partial Code [id number]  Cox, Nadyne [place], DO ED   Advance Care Planning Activity      Prognosis:  < 6 months     Thank you for allowing the Palliative Medicine Team to assist in the care of this patient.   Total Time 25 min Prolonged Time Billed  no      Greater than 50%  of this time was spent counseling and coordinating care related to the above assessment and plan.  Morton Stall, NP  Please contact Palliative Medicine Team phone at [phone] for questions and concerns.

## 2020-10-07 NOTE — Care Management Important Message (Signed)
Important Message  Patient Details  Name: Patrick Mccormick MRN: [id number] Date of Birth: [birth year]   Medicare Important Message Given:  Yes     [name] 10/07/2020, 11:55 AM

## 2020-10-07 NOTE — Progress Notes (Signed)
PT Cancellation Note  Patient Details Name: Patrick Mccormick MRN: [id number] DOB: August 19, 1932   Cancelled Treatment:    Reason Eval/Treat Not Completed: Other (comment) (Per notes pt to d/c today. Transitioning home with hospice care. Will delist at this time. Please reconsult if needs change.)  1:39 PM, 10/07/20 [name] A. Mordecai Maes PT, DPT Physical Therapist - [facility]  [name] A [name] 10/07/2020, 1:39 PM

## 2020-10-11 LAB — VITAMIN A: Vitamin A (Retinoic Acid): 38.5 ug/dL (ref 22.0–69.5)

## 2020-10-14 LAB — VITAMIN B6: Vitamin B6: 7.1 ug/L (ref 5.3–46.7)

## 2020-10-15 LAB — 25-HYDROXY VITAMIN D LCMS D2+D3
25-Hydroxy, Vitamin D-2: <1 ng/mL
25-Hydroxy, Vitamin D-3: 50 ng/mL
25-Hydroxy, Vitamin D: 50 ng/mL

## 2020-10-15 LAB — VITAMIN C: Vitamin C: 6.4 mg/dL — ABNORMAL HIGH (ref 0.4–2.0)

## 2020-10-18 DEATH — deceased

## 2020-10-31 LAB — FUNGUS CULTURE WITH STAIN

## 2020-10-31 LAB — FUNGUS CULTURE RESULT

## 2020-10-31 LAB — FUNGAL ORGANISM REFLEX

## 2020-11-02 LAB — FUNGAL ORGANISM REFLEX

## 2020-11-02 LAB — FUNGUS CULTURE WITH STAIN

## 2020-11-02 LAB — FUNGUS CULTURE RESULT

## 2020-11-14 LAB — ACID FAST CULTURE WITH REFLEXED SENSITIVITIES (MYCOBACTERIA): Acid Fast Culture: NEGATIVE

## 2022-05-18 IMAGING — US US THORACENTESIS ASP PLEURAL SPACE W/IMG GUIDE
1 series · 4 of 4 positions shown · non-contrast
Comparison: none

INDICATION: Patient with history of pulmonary hypertension and persistent AFib.
Found to have bilateral pleural effusions after presenting to the
emergency department with shortness of breath. Team is requesting
therapeutic and diagnostic thoracentesis

[Series 1: us thoracentesis asp pleural space w/img guide · 4 of 4 slices shown]
[im 1/4]
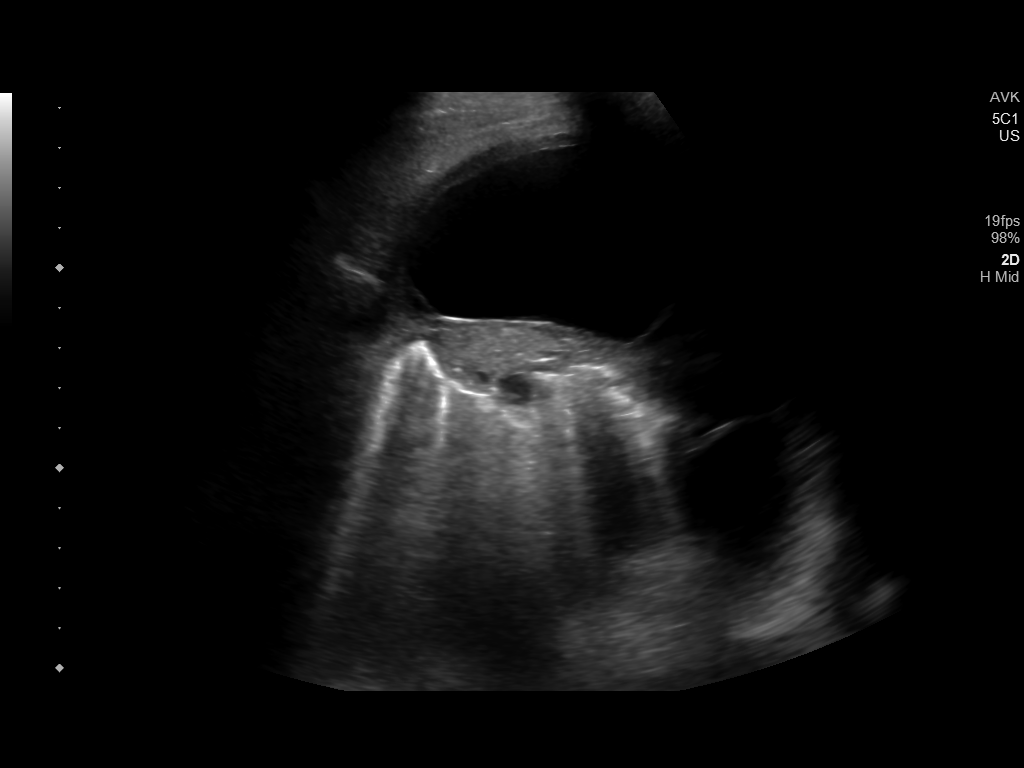
[im 2/4]
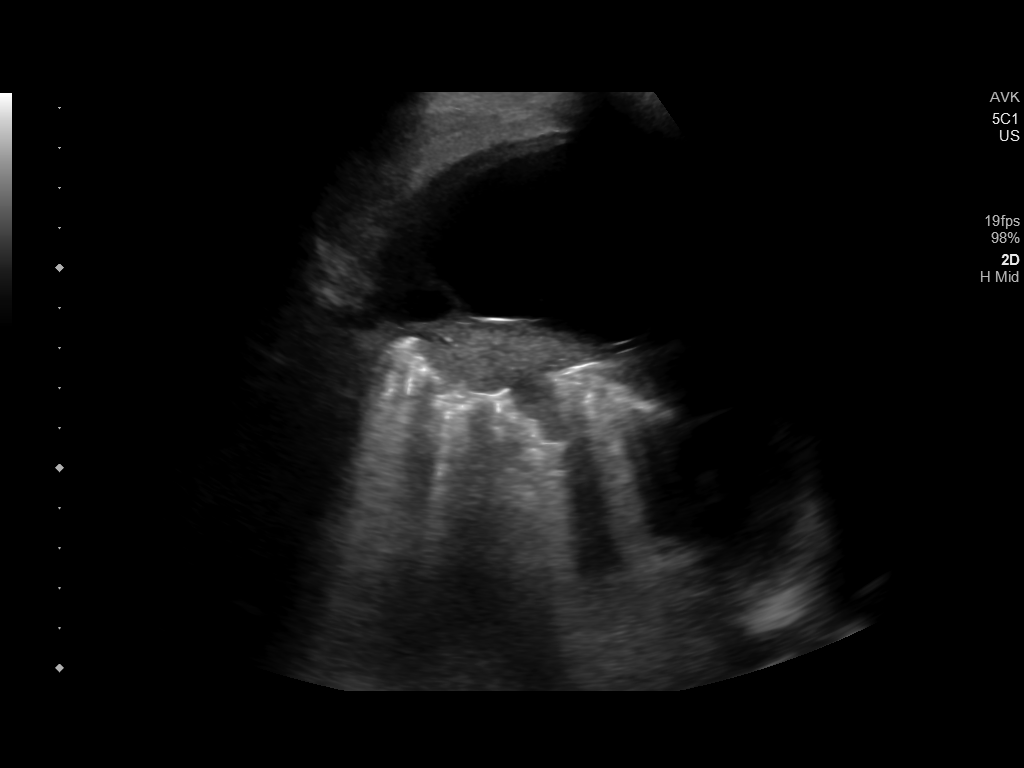
[im 3/4]
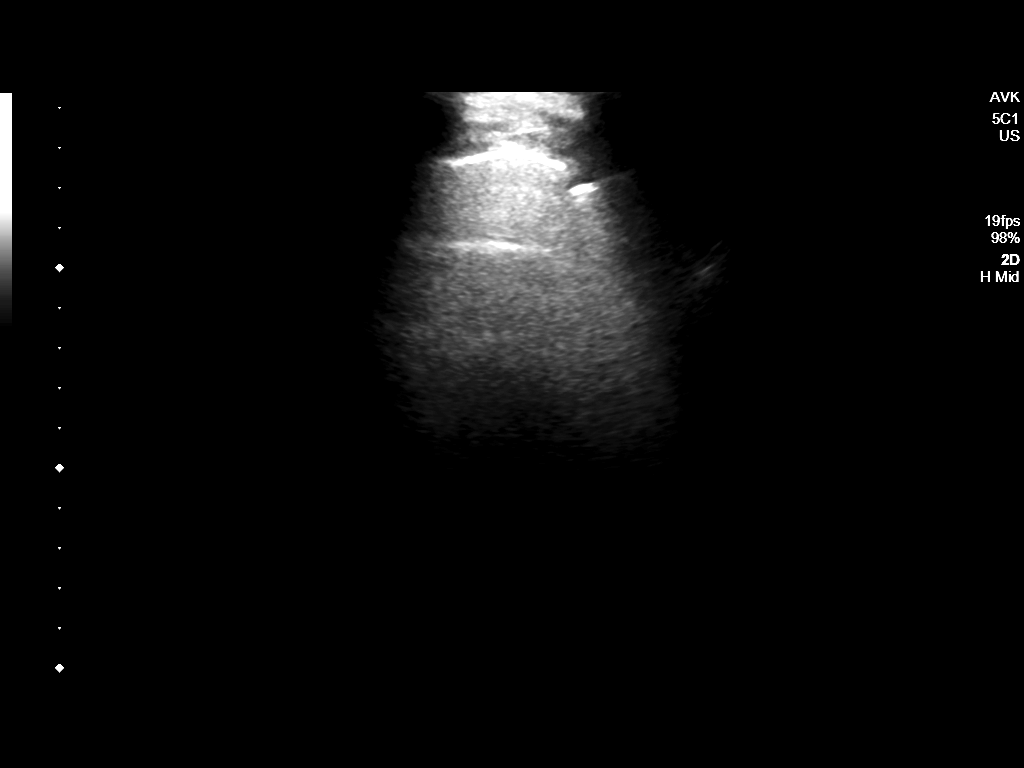
[im 4/4]
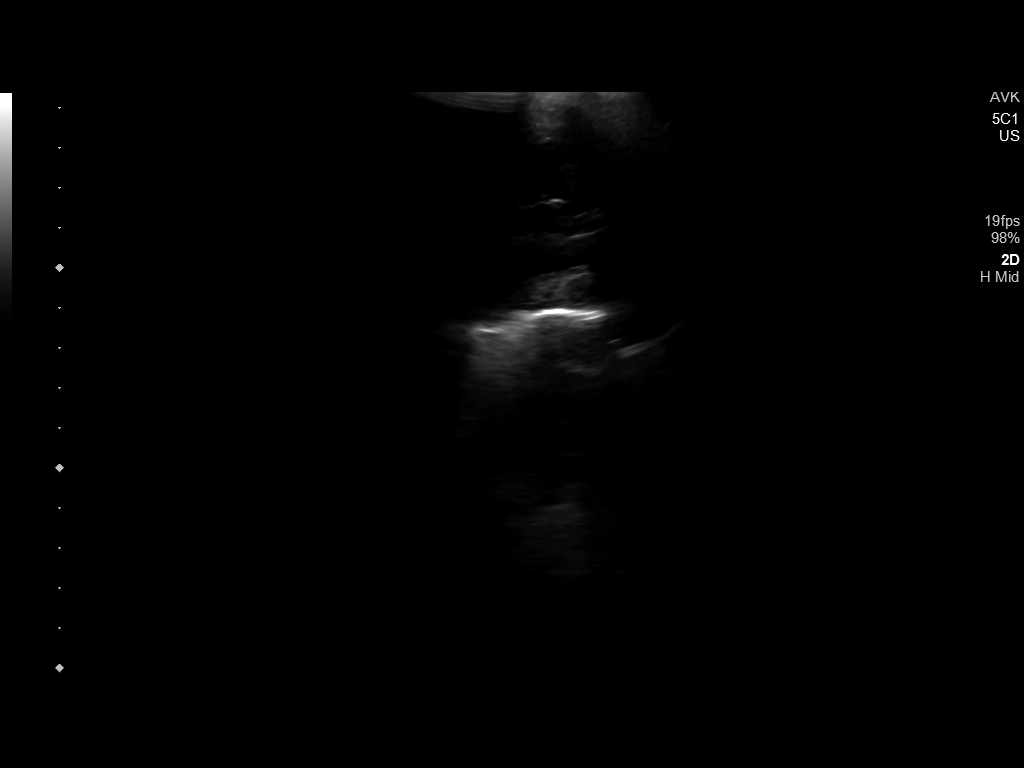

[4 of 4 positions shown; findings below may reference images not displayed]

EXAM:
ULTRASOUND GUIDED RIGHT-SIDED THERAPEUTIC AND DIAGNOSTIC
THORACENTESIS

MEDICATIONS:
Lidocaine 1% 10 mL

COMPLICATIONS:
None immediate.

PROCEDURE:
An ultrasound guided thoracentesis was thoroughly discussed with the
patient and questions answered. The benefits, risks, alternatives
and complications were also discussed. The patient understands and
wishes to proceed with the procedure. Written consent was obtained.

Ultrasound was performed to localize and mark an adequate pocket of
fluid in the right chest. The area was then prepped and draped in
the normal sterile fashion. 1% Lidocaine was used for local
anesthesia. Under ultrasound guidance a 6 Fr Safe-T-Centesis
catheter was introduced. Thoracentesis was performed. The catheter
was removed and a dressing applied.
FINDINGS: A total of approximately 350 mL of amber colored fluid was removed.
Samples were sent to the laboratory as requested by the clinical
team. Patient unable to tolerate additional fluid removal and per
patient request the procedure was terminated.
IMPRESSION: Successful ultrasound guided therapeutic and diagnostic right-sided
thoracentesis yielding 350 mL of pleural fluid.

Read by: Benrabah Etoil, NP

## 2022-05-18 IMAGING — DX DG CHEST 1V PORT
1 series · 1 of 1 positions shown · non-contrast
Comparison: CT a chest in chest x-ray from yesterday.

CLINICAL DATA: Status post right-sided thoracentesis.

EXAM:
PORTABLE CHEST 1 VIEW

[chest ap]
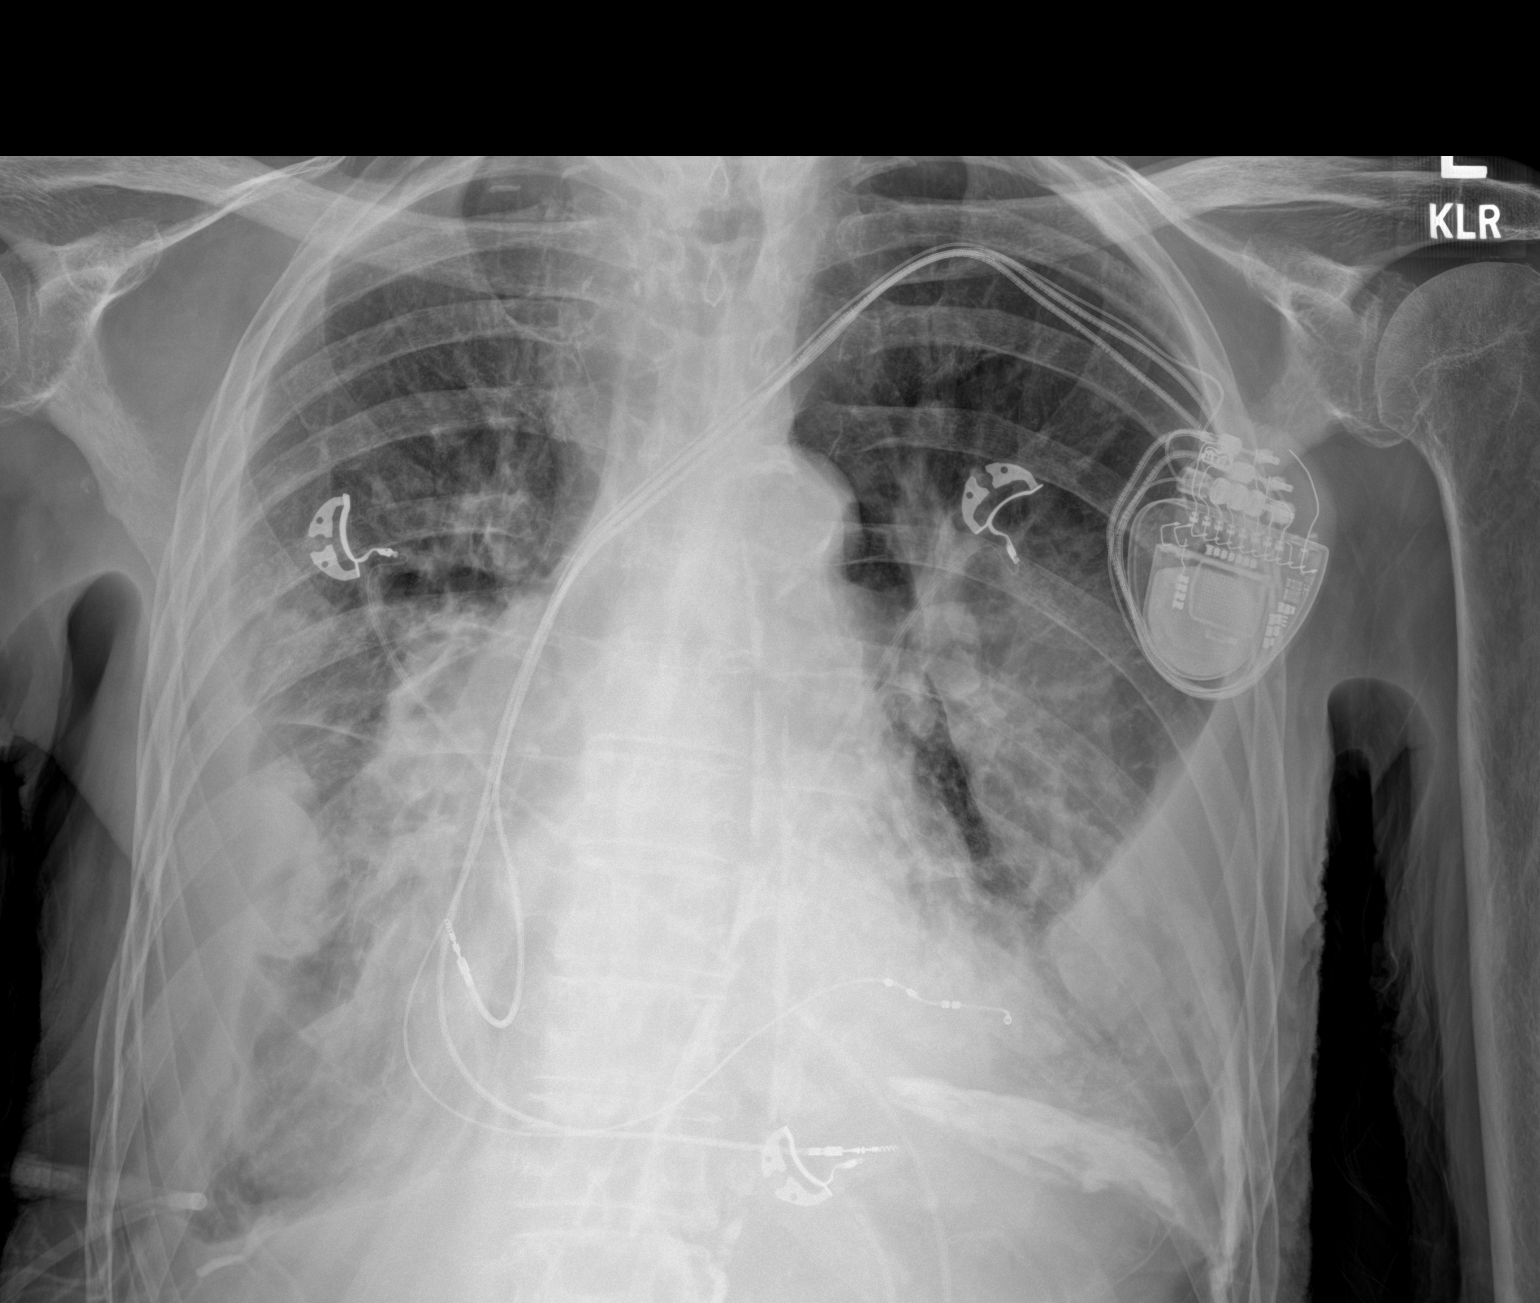

[1 of 1 positions shown; findings below may reference images not displayed]

FINDINGS: Unchanged left chest wall pacemaker. Stable cardiomegaly and
pulmonary artery enlargement. Loculated bilateral pleural effusions
again noted, slightly decreased on the right status post
thoracentesis. No pneumothorax. Unchanged pleural calcification at
the lung bases and bilateral lower lobe atelectasis. No acute
osseous abnormality.
IMPRESSION: 1. Loculated bilateral pleural effusions, slightly decreased on the
right status post thoracentesis. No pneumothorax.

## 2022-05-21 IMAGING — CR DG CHEST 2V
2 series · 2 of 2 positions shown · non-contrast
Comparison: 10/02/2020

CLINICAL DATA: Acute renal injury

EXAM:
CHEST - 2 VIEW

[chest lat]
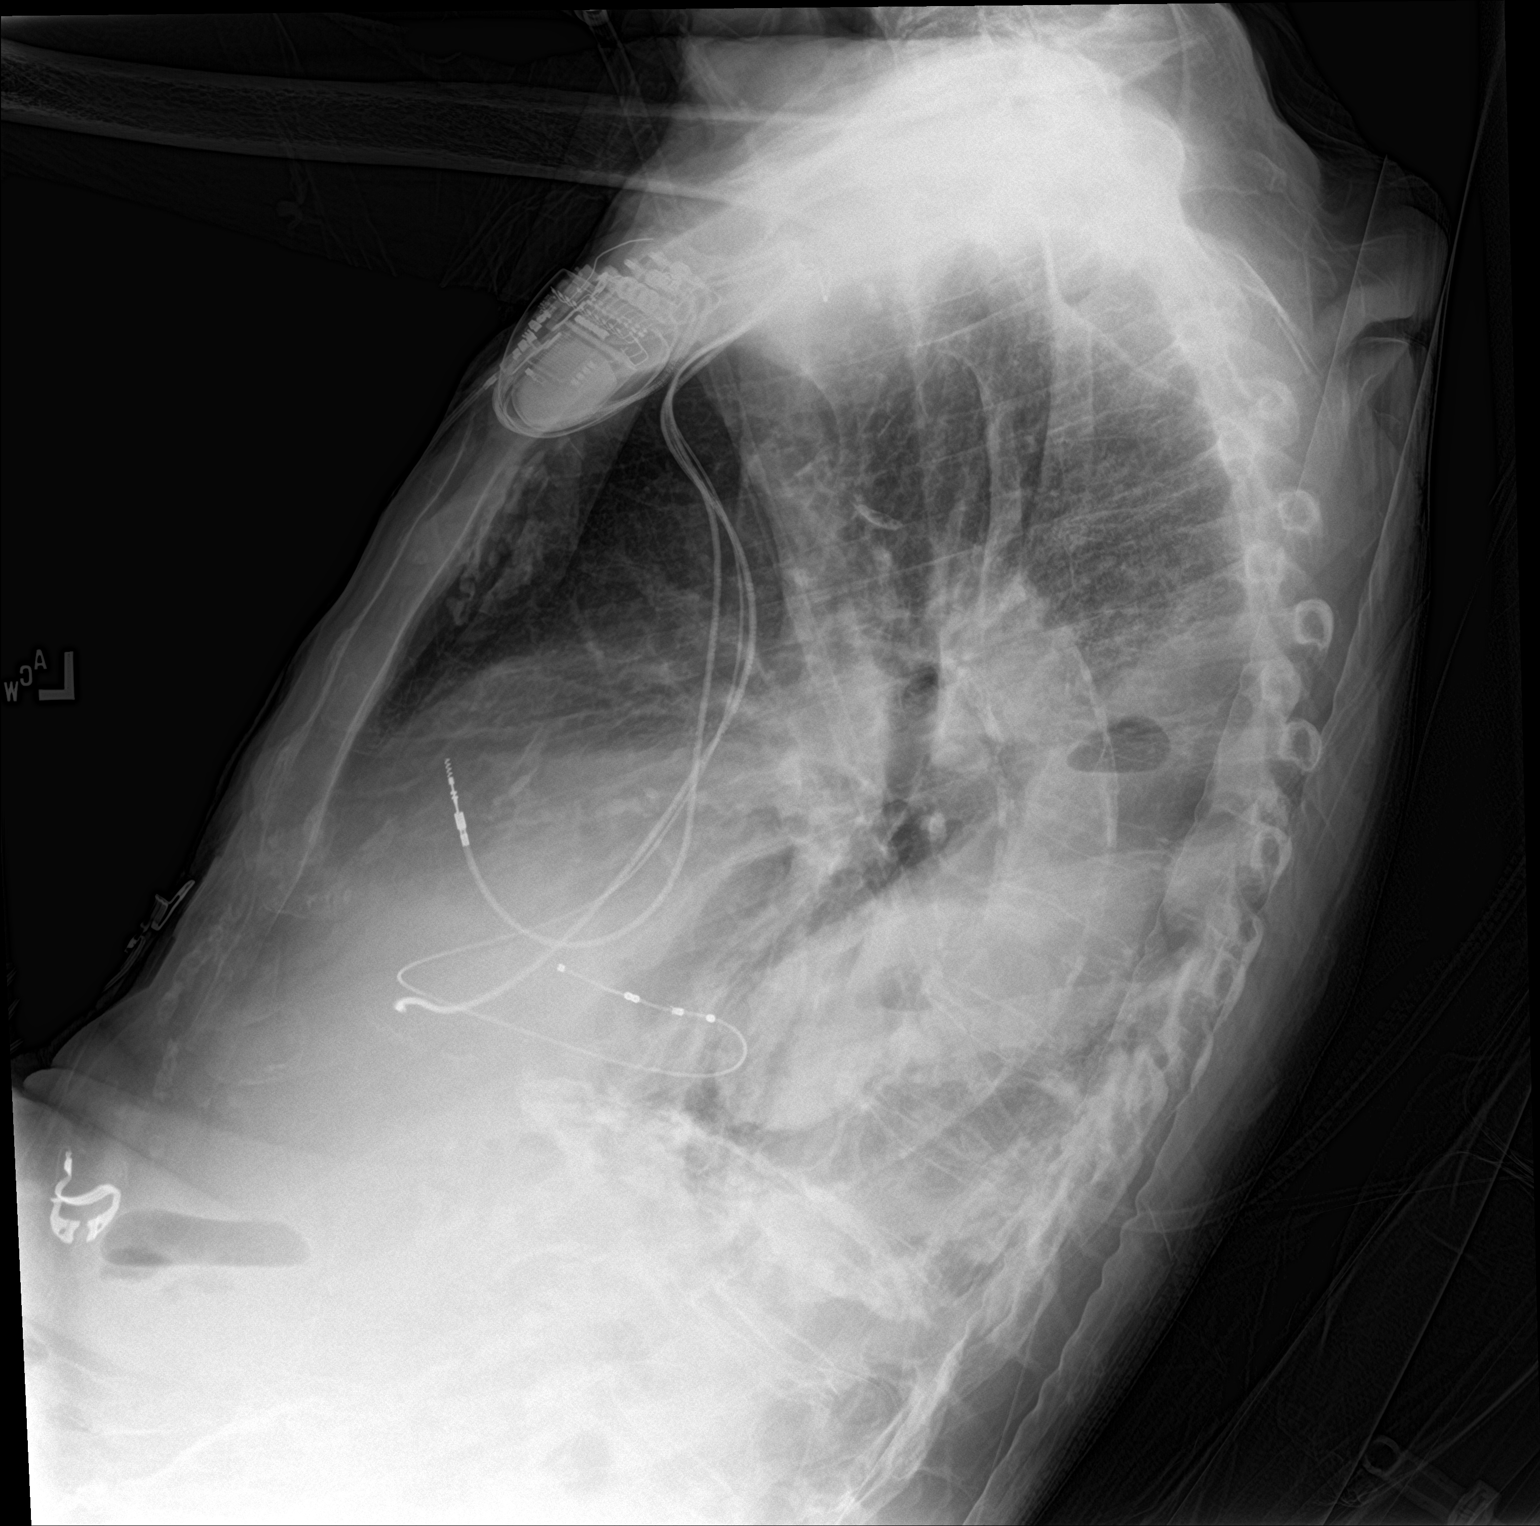

[chest ap]
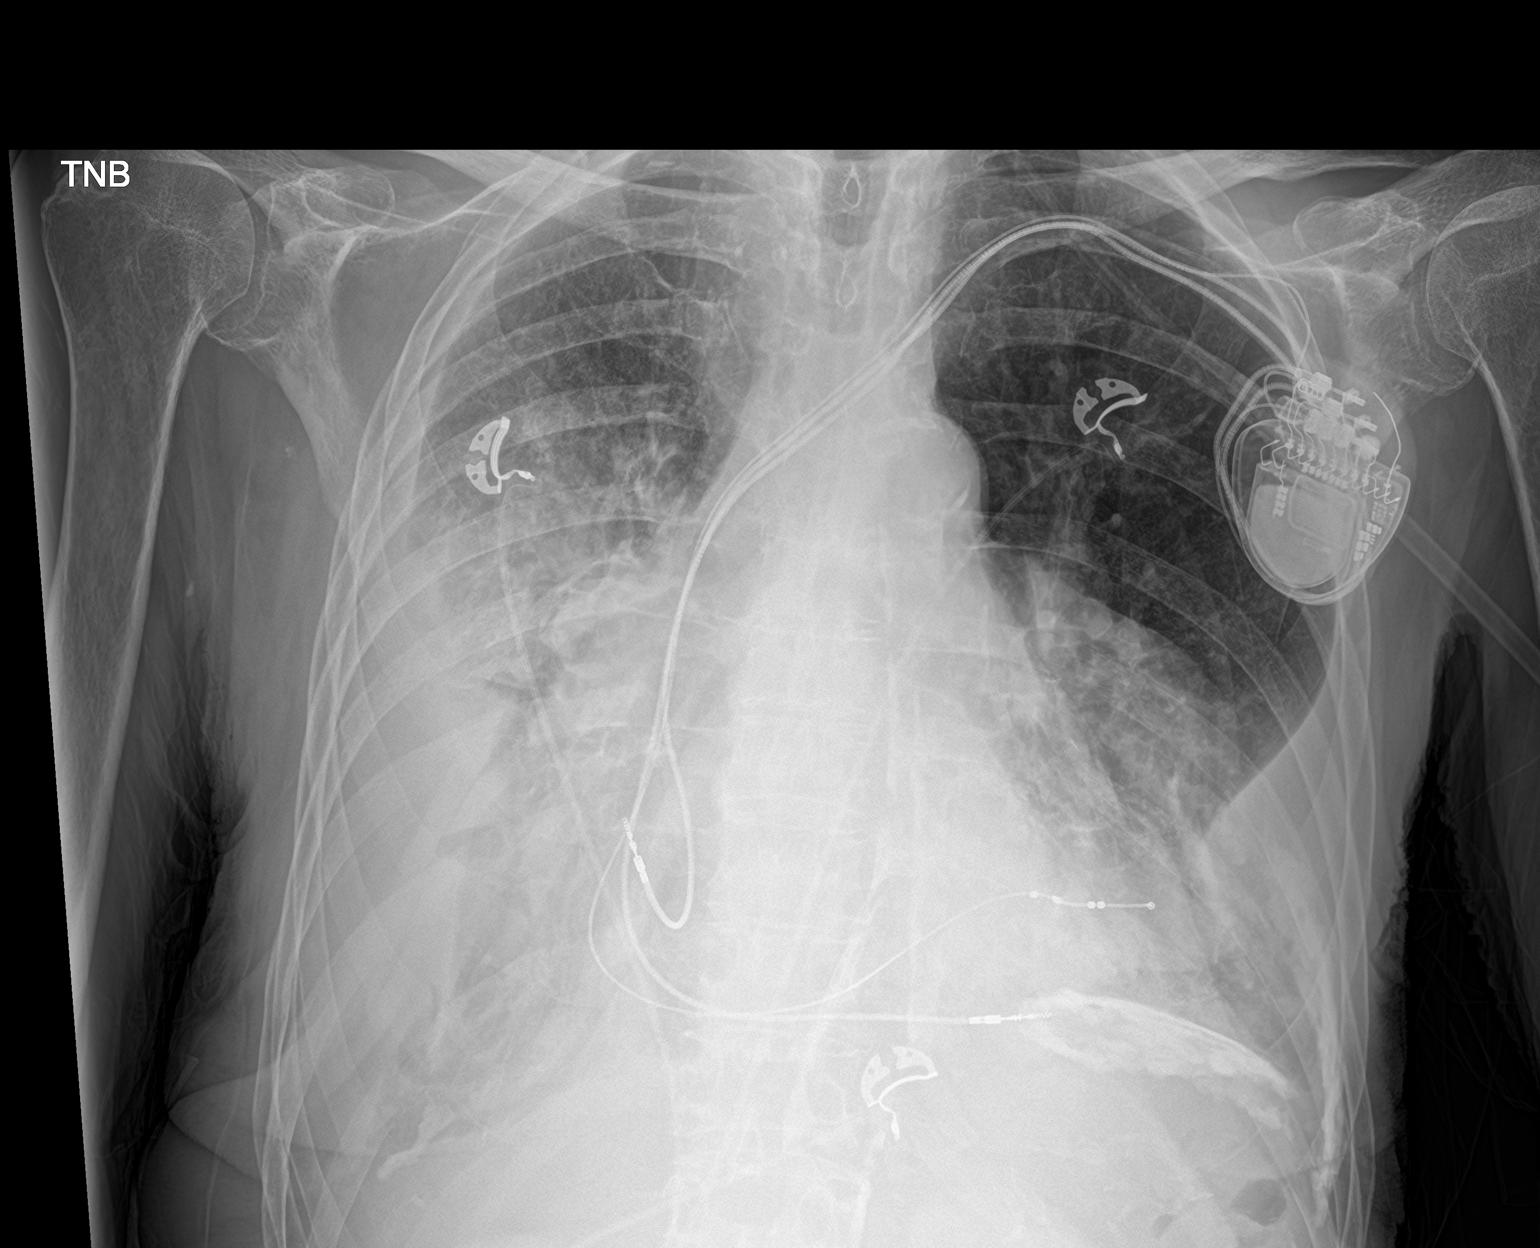

[2 of 2 positions shown; findings below may reference images not displayed]

FINDINGS: LEFT-sided pacemaker overlies stable cardiac silhouette. Large
bilateral loculated pleural effusions. The LEFT effusion appears
slightly decreased. No pneumothorax. Calcified pleural plaque over
the LEFT hemidiaphragm.
IMPRESSION: 1. Bilateral large loculated pleural effusions.
2. LEFT effusion may be slightly reduced.
3. No pulmonary edema or pneumothorax.
